# Patient Record
Sex: Female | Born: 1950
Health system: Southern US, Community
[De-identification: ages and names within clinical notes are randomized; demographics above are authoritative.]

## PROBLEM LIST (undated history)

## (undated) DIAGNOSIS — M810 Age-related osteoporosis without current pathological fracture: Secondary | ICD-10-CM

## (undated) DIAGNOSIS — R7303 Prediabetes: Secondary | ICD-10-CM

## (undated) DIAGNOSIS — H6990 Unspecified Eustachian tube disorder, unspecified ear: Secondary | ICD-10-CM

## (undated) DIAGNOSIS — H6980 Other specified disorders of Eustachian tube, unspecified ear: Secondary | ICD-10-CM

## (undated) DIAGNOSIS — K635 Polyp of colon: Secondary | ICD-10-CM

## (undated) DIAGNOSIS — M1990 Unspecified osteoarthritis, unspecified site: Secondary | ICD-10-CM

## (undated) DIAGNOSIS — G245 Blepharospasm: Secondary | ICD-10-CM

## (undated) DIAGNOSIS — N189 Chronic kidney disease, unspecified: Secondary | ICD-10-CM

## (undated) DIAGNOSIS — T7840XA Allergy, unspecified, initial encounter: Secondary | ICD-10-CM

## (undated) DIAGNOSIS — E785 Hyperlipidemia, unspecified: Secondary | ICD-10-CM

## (undated) DIAGNOSIS — Z87442 Personal history of urinary calculi: Secondary | ICD-10-CM

## (undated) DIAGNOSIS — I10 Essential (primary) hypertension: Secondary | ICD-10-CM

## (undated) DIAGNOSIS — R011 Cardiac murmur, unspecified: Secondary | ICD-10-CM

## (undated) DIAGNOSIS — D649 Anemia, unspecified: Secondary | ICD-10-CM

## (undated) DIAGNOSIS — E2839 Other primary ovarian failure: Secondary | ICD-10-CM

## (undated) HISTORY — DX: Unspecified eustachian tube disorder, unspecified ear: H69.90

## (undated) HISTORY — DX: Other primary ovarian failure: E28.39

## (undated) HISTORY — DX: Allergy, unspecified, initial encounter: T78.40XA

## (undated) HISTORY — PX: OVARY SURGERY: SHX727

## (undated) HISTORY — DX: Age-related osteoporosis without current pathological fracture: M81.0

## (undated) HISTORY — DX: Polyp of colon: K63.5

## (undated) HISTORY — DX: Chronic kidney disease, unspecified: N18.9

## (undated) HISTORY — DX: Other specified disorders of Eustachian tube, unspecified ear: H69.80

## (undated) HISTORY — PX: ABDOMINAL HYSTERECTOMY: SHX81

## (undated) HISTORY — DX: Unspecified osteoarthritis, unspecified site: M19.90

## (undated) HISTORY — PX: OTHER SURGICAL HISTORY: SHX169

## (undated) HISTORY — DX: Cardiac murmur, unspecified: R01.1

## (undated) HISTORY — PX: OOPHORECTOMY: SHX86

---

## 2010-03-20 HISTORY — PX: COLONOSCOPY: SHX174

## 2011-11-23 DIAGNOSIS — M25569 Pain in unspecified knee: Secondary | ICD-10-CM | POA: Insufficient documentation

## 2016-05-25 DIAGNOSIS — Z1231 Encounter for screening mammogram for malignant neoplasm of breast: Secondary | ICD-10-CM | POA: Diagnosis not present

## 2016-06-16 DIAGNOSIS — B349 Viral infection, unspecified: Secondary | ICD-10-CM | POA: Diagnosis not present

## 2016-06-16 DIAGNOSIS — J329 Chronic sinusitis, unspecified: Secondary | ICD-10-CM | POA: Diagnosis not present

## 2016-07-07 DIAGNOSIS — R030 Elevated blood-pressure reading, without diagnosis of hypertension: Secondary | ICD-10-CM | POA: Diagnosis not present

## 2016-07-07 DIAGNOSIS — M25551 Pain in right hip: Secondary | ICD-10-CM | POA: Diagnosis not present

## 2016-07-07 DIAGNOSIS — S76911A Strain of unspecified muscles, fascia and tendons at thigh level, right thigh, initial encounter: Secondary | ICD-10-CM | POA: Diagnosis not present

## 2016-07-07 DIAGNOSIS — S76211A Strain of adductor muscle, fascia and tendon of right thigh, initial encounter: Secondary | ICD-10-CM | POA: Diagnosis not present

## 2016-07-27 DIAGNOSIS — M1711 Unilateral primary osteoarthritis, right knee: Secondary | ICD-10-CM | POA: Diagnosis not present

## 2016-10-20 DIAGNOSIS — E559 Vitamin D deficiency, unspecified: Secondary | ICD-10-CM | POA: Diagnosis not present

## 2016-10-20 DIAGNOSIS — Z0000 Encounter for general adult medical examination without abnormal findings: Secondary | ICD-10-CM | POA: Diagnosis not present

## 2016-10-20 DIAGNOSIS — D649 Anemia, unspecified: Secondary | ICD-10-CM | POA: Diagnosis not present

## 2016-10-20 DIAGNOSIS — E039 Hypothyroidism, unspecified: Secondary | ICD-10-CM | POA: Diagnosis not present

## 2016-10-20 DIAGNOSIS — N3090 Cystitis, unspecified without hematuria: Secondary | ICD-10-CM | POA: Diagnosis not present

## 2016-10-20 DIAGNOSIS — N390 Urinary tract infection, site not specified: Secondary | ICD-10-CM | POA: Diagnosis not present

## 2016-10-20 DIAGNOSIS — E7800 Pure hypercholesterolemia, unspecified: Secondary | ICD-10-CM | POA: Diagnosis not present

## 2016-10-27 DIAGNOSIS — D649 Anemia, unspecified: Secondary | ICD-10-CM | POA: Diagnosis not present

## 2016-10-27 DIAGNOSIS — N3090 Cystitis, unspecified without hematuria: Secondary | ICD-10-CM | POA: Diagnosis not present

## 2016-10-27 DIAGNOSIS — E7800 Pure hypercholesterolemia, unspecified: Secondary | ICD-10-CM | POA: Diagnosis not present

## 2016-10-27 DIAGNOSIS — N390 Urinary tract infection, site not specified: Secondary | ICD-10-CM | POA: Diagnosis not present

## 2016-10-27 DIAGNOSIS — E039 Hypothyroidism, unspecified: Secondary | ICD-10-CM | POA: Diagnosis not present

## 2016-10-27 DIAGNOSIS — E559 Vitamin D deficiency, unspecified: Secondary | ICD-10-CM | POA: Diagnosis not present

## 2016-11-24 DIAGNOSIS — B369 Superficial mycosis, unspecified: Secondary | ICD-10-CM | POA: Diagnosis not present

## 2017-02-19 ENCOUNTER — Encounter (HOSPITAL_COMMUNITY): Payer: Self-pay | Admitting: *Deleted

## 2017-02-19 ENCOUNTER — Other Ambulatory Visit: Payer: Self-pay

## 2017-02-19 ENCOUNTER — Ambulatory Visit (HOSPITAL_COMMUNITY)
Admission: EM | Admit: 2017-02-19 | Discharge: 2017-02-19 | Disposition: A | Discharge status: Home / Self Care | Payer: Medicare Other | Attending: Urgent Care | Admitting: Urgent Care

## 2017-02-19 DIAGNOSIS — E785 Hyperlipidemia, unspecified: Secondary | ICD-10-CM | POA: Diagnosis not present

## 2017-02-19 DIAGNOSIS — N3090 Cystitis, unspecified without hematuria: Secondary | ICD-10-CM | POA: Diagnosis not present

## 2017-02-19 DIAGNOSIS — R350 Frequency of micturition: Secondary | ICD-10-CM

## 2017-02-19 DIAGNOSIS — Z885 Allergy status to narcotic agent status: Secondary | ICD-10-CM | POA: Insufficient documentation

## 2017-02-19 DIAGNOSIS — R300 Dysuria: Secondary | ICD-10-CM | POA: Diagnosis present

## 2017-02-19 DIAGNOSIS — N3091 Cystitis, unspecified with hematuria: Secondary | ICD-10-CM | POA: Diagnosis not present

## 2017-02-19 DIAGNOSIS — Z87442 Personal history of urinary calculi: Secondary | ICD-10-CM | POA: Insufficient documentation

## 2017-02-19 HISTORY — DX: Hyperlipidemia, unspecified: E78.5

## 2017-02-19 LAB — POCT URINALYSIS DIP (DEVICE)
BILIRUBIN URINE: NEGATIVE
GLUCOSE, UA: NEGATIVE mg/dL
KETONES UR: NEGATIVE mg/dL
Leukocytes, UA: NEGATIVE
NITRITE: NEGATIVE
PH: 7 (ref 5.0–8.0)
PROTEIN: NEGATIVE mg/dL
Specific Gravity, Urine: 1.01 (ref 1.005–1.030)
Urobilinogen, UA: 0.2 mg/dL (ref 0.0–1.0)

## 2017-02-19 MED ORDER — SULFAMETHOXAZOLE-TRIMETHOPRIM 800-160 MG PO TABS: 1.0000 | ORAL_TABLET | Freq: Two times a day (BID) | ORAL | 0 refills | Status: DC

## 2017-02-19 NOTE — Discharge Instructions (Signed)
I encourage you to call [facility] to establish care with a urologist in town given your medical history.

## 2017-02-19 NOTE — ED Triage Notes (Signed)
Pt came in stated she have uti, per pt she had blood in urine at home, per pt her back on the left side hurts,

## 2017-02-19 NOTE — ED Provider Notes (Signed)
  MRN: [id number] DOB: 05/05/1950  Subjective:   Michelle Foster is a 66 y.o. female presenting for 1 week history of left sided flank pain, dysuria, hematuria and urinary frequency. 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 Has been evaluated by urology and had normal work up, last [place] was 2015. Her last visit with gynecology was 2015, had normal findings. Denies fever, abdominal pain, pelvic pain, cloudy malordorous urine, genital rash and vaginal discharge, nausea and vomiting. Does not hydrate well, drinks tea, sodas and caffeine drinks. Has issues with sodas causing urinary irritation.  Michelle Foster takes Crestor and is allergic to codeine. Michelle Foster  has a past medical history of Hyperlipidemia. Also  has a past surgical history that includes Ovary surgery.  Objective:   Vitals: BP (!) 153/81 (BP Location: Left Arm) Comment: per pt this is her normal at the doctors office  Pulse 68   Temp 98.2 F (36.8 C) (Oral)   SpO2 100%   Physical Exam  Constitutional: She is oriented to person, place, and time. She appears well-developed and well-nourished.  HENT:  Mouth/Throat: Oropharynx is clear and moist.  Cardiovascular: Normal rate, regular rhythm and intact distal pulses. Exam reveals no gallop and no friction rub.  No murmur heard. Pulmonary/Chest: No respiratory distress. She has no wheezes. She has no rales.  Abdominal: Soft. Bowel sounds are normal. She exhibits no distension and no mass. There is tenderness (mild, right sided). There is no guarding.  No CVA tenderness.  Neurological: She is alert and oriented to person, place, and time.  Skin: Skin is warm and dry.    Results for orders placed or performed during the hospital encounter of 02/19/17 (from the past 24 hour(s))  POCT urinalysis dip (device)     Status: Abnormal   Collection Time: 02/19/17 10:40 AM  Result Value Ref Range   Glucose, UA NEGATIVE NEGATIVE mg/dL   Bilirubin  Urine NEGATIVE NEGATIVE   Ketones, ur NEGATIVE NEGATIVE mg/dL   Specific Gravity, Urine 1.010 1.005 - 1.030   Hgb urine dipstick SMALL (A) NEGATIVE   pH 7.0 5.0 - 8.0   Protein, ur NEGATIVE NEGATIVE mg/dL   Urobilinogen, UA 0.2 0.0 - 1.0 mg/dL   Nitrite NEGATIVE NEGATIVE   Leukocytes, UA NEGATIVE NEGATIVE   Assessment and Plan :   Cystitis  Urinary frequency  Patient is very worried about a UTI. However, I suspect she has interstitial cystitis and recommended she cut out caffeine drinks, sodas, sweet tea. Hydrate much better. She may also have a renal stone given her belly pain on exam. Counseled on differential. She will look to establish care with [facility] for a consult. In the meantime, will address infectious etiology with Bactrim x5 days. Return-to-clinic precautions discussed, patient verbalized understanding.   Jaynee Eagles, PA-C [facility] [phone] 02/19/2017 10:42 AM    Jaynee Eagles, PA-C 02/19/17 1114

## 2017-02-20 LAB — URINE CULTURE

## 2017-03-08 ENCOUNTER — Encounter: Payer: Self-pay | Admitting: Family Medicine

## 2017-03-08 ENCOUNTER — Ambulatory Visit (INDEPENDENT_AMBULATORY_CARE_PROVIDER_SITE_OTHER): Payer: Medicare Other | Admitting: Family Medicine

## 2017-03-08 VITALS — BP 152/98 | HR 80 | Temp 98.2°F | Resp 16 | Ht 64.0 in | Wt 150.0 lb

## 2017-03-08 DIAGNOSIS — Z7689 Persons encountering health services in other specified circumstances: Secondary | ICD-10-CM

## 2017-03-08 DIAGNOSIS — E7800 Pure hypercholesterolemia, unspecified: Secondary | ICD-10-CM | POA: Diagnosis not present

## 2017-03-08 DIAGNOSIS — Z23 Encounter for immunization: Secondary | ICD-10-CM | POA: Diagnosis not present

## 2017-03-08 DIAGNOSIS — M810 Age-related osteoporosis without current pathological fracture: Secondary | ICD-10-CM | POA: Diagnosis not present

## 2017-03-08 LAB — CBC WITH DIFFERENTIAL/PLATELET
BASOS PCT: 1.2 %
Basophils Absolute: 94 cells/uL (ref 0–200)
EOS ABS: 265 {cells}/uL (ref 15–500)
Eosinophils Relative: 3.4 %
HEMATOCRIT: 39.1 % (ref 35.0–45.0)
HEMOGLOBIN: 13.3 g/dL (ref 11.7–15.5)
LYMPHS ABS: 1989 {cells}/uL (ref 850–3900)
MCH: 31.8 pg (ref 27.0–33.0)
MCHC: 34 g/dL (ref 32.0–36.0)
MCV: 93.5 fL (ref 80.0–100.0)
MPV: 10.8 fL (ref 7.5–12.5)
Monocytes Relative: 6.1 %
Neutro Abs: 4976 cells/uL (ref 1500–7800)
Neutrophils Relative %: 63.8 %
Platelets: 317 10*3/uL (ref 140–400)
RBC: 4.18 10*6/uL (ref 3.80–5.10)
RDW: 12.2 % (ref 11.0–15.0)
Total Lymphocyte: 25.5 %
WBC: 7.8 10*3/uL (ref 3.8–10.8)
WBCMIX: 476 {cells}/uL (ref 200–950)

## 2017-03-08 LAB — COMPLETE METABOLIC PANEL WITH GFR
AG RATIO: 1.3 (calc) (ref 1.0–2.5)
ALBUMIN MSPROF: 4.2 g/dL (ref 3.6–5.1)
ALT: 14 U/L (ref 6–29)
AST: 18 U/L (ref 10–35)
Alkaline phosphatase (APISO): 95 U/L (ref 33–130)
BUN / CREAT RATIO: 18 (calc) (ref 6–22)
BUN: 18 mg/dL (ref 7–25)
CALCIUM: 9.9 mg/dL (ref 8.6–10.4)
CO2: 26 mmol/L (ref 20–32)
Chloride: 104 mmol/L (ref 98–110)
Creat: 1.01 mg/dL — ABNORMAL HIGH (ref 0.50–0.99)
GFR, EST AFRICAN AMERICAN: 67 mL/min/{1.73_m2} (ref >=60)
GFR, EST NON AFRICAN AMERICAN: 58 mL/min/{1.73_m2} — AB (ref >=60)
GLOBULIN: 3.3 g/dL (ref 1.9–3.7)
Glucose, Bld: 92 mg/dL (ref 65–99)
POTASSIUM: 4.5 mmol/L (ref 3.5–5.3)
SODIUM: 137 mmol/L (ref 135–146)
TOTAL PROTEIN: 7.5 g/dL (ref 6.1–8.1)
Total Bilirubin: 0.4 mg/dL (ref 0.2–1.2)

## 2017-03-08 LAB — LIPID PANEL
Cholesterol: 203 mg/dL — ABNORMAL HIGH (ref <200)
HDL: 38 mg/dL — ABNORMAL LOW (ref >50)
LDL Cholesterol (Calc): 133 mg/dL (calc) — ABNORMAL HIGH
NON-HDL CHOLESTEROL (CALC): 165 mg/dL — AB (ref <130)
TRIGLYCERIDES: 184 mg/dL — AB (ref <150)
Total CHOL/HDL Ratio: 5.3 (calc) — ABNORMAL HIGH (ref <5.0)

## 2017-03-08 NOTE — Addendum Note (Signed)
Addended by: Shary Decamp B on: 03/08/2017 12:37 PM   Modules accepted: Orders

## 2017-03-08 NOTE — Progress Notes (Signed)
Subjective:    Patient ID: Michelle Foster, female    DOB: 16-May-1950, 66 y.o.   MRN: [id number]  HPI Patient is a very pleasant 66 year old white female here today to establish care. Past medical history is significant for a bilateral oophorectomy at age 53 there was an accident and a surgical error causing premature menopause. She underwent a hysterectomy in 1983. She is also had a right meniscal repair as well as a biopsy of a benign mass on her urethra. Past medical history significant for hyperlipidemia and osteoporosis. Her last mammogram was in January. Her last colonoscopy was 4 years ago. She is due for colonoscopy next year due to a history of colon polyps. She does not require Pap smears because of her previous surgical history. She is due for her pneumonia vaccine, her shingles vaccine, her flu shot 66 y.o.   MRN: [id number]  HPI Patient is a very pleasant 66 year old white female here today to establish care. Past medical history is significant for a bilateral oophorectomy at age 53 there was an accident and a surgical error causing premature menopause. She underwent a hysterectomy in 1983. She is also had a right meniscal repair as well as a biopsy of a benign mass on her urethra. Past medical history significant for hyperlipidemia and osteoporosis. Her last mammogram was in January. Her last colonoscopy was 4 years ago. She is due for colonoscopy next year due to a history of colon polyps. She does not require Pap smears because of her previous surgical history. She is due for her pneumonia vaccine, her shingles vaccine, her flu shot as well as her tetanus shot. Past Medical History:  Diagnosis Date  . Hyperlipidemia   . Osteoporosis    Past Surgical History:  Procedure Laterality Date  . ABDOMINAL HYSTERECTOMY     TAH BSO by accident at 66   . meniscal tear     right  . OVARY SURGERY     1982   Current Outpatient Medications on File Prior to Visit  Medication Sig Dispense Refill  . aspirin EC 81 MG tablet Take 81 mg by mouth daily.    . Cholechiferol (VITAMIN D3) 5000 units CAPS Take by mouth.    Marland Kitchen ketoconazole (NIZORAL) 200 MG tablet 200 mg. Takes 2 tab the 1st of the month    . rosuvastatin (CRESTOR) 10 MG tablet Take 10 mg by mouth daily.     No current facility-administered medications on file prior to visit. Past Medical History:  Diagnosis Date  . Hyperlipidemia   . Osteoporosis    Past Surgical History:  Procedure Laterality Date  . ABDOMINAL HYSTERECTOMY     TAH BSO by accident at 66   . meniscal tear     right  . OVARY SURGERY     1982   Current Outpatient Medications on File Prior to Visit  Medication Sig Dispense Refill  . aspirin EC 81 MG tablet Take 81 mg by mouth daily.    . Cholecalciferol (VITAMIN D3) 5000 units CAPS Take by mouth.    Marland Kitchen ketoconazole (NIZORAL) 200 MG tablet 200 mg. Takes 2 tab the 1st of the month    . rosuvastatin (CRESTOR) 10 MG tablet Take 10 mg by mouth daily.     No current facility-administered medications on file prior to visit.    Allergies  Allergen Reactions  . Codeine    Social History   Socioeconomic History  . Marital status: Widowed    Spouse name: Not on file  . Number of children: Not on file  . Years of education: Not on file  . Highest education level: Not on file  Social Needs  . Financial resource strain: Not on file  . Food insecurity - worry: Not on file  . Food insecurity - inability:  Not on file  . Transportation needs - medical: Not on file  . Transportation needs - non-medical: Not on file  Occupational History  . Not on file  Tobacco Use  . Smoking status: Never Smoker  . Smokeless tobacco: Never Used  Substance and Sexual Activity  . Alcohol use: No    Frequency: Never  . Drug use: No  . Sexual activity: Not on file  Other Topics Concern  . Not on file  Social History Narrative  . Not on file   Family History  Problem Relation Age of Onset  . Cancer Mother 93      Review of Systems  All other systems reviewed and are negative.      Objective:   Physical Exam  Constitutional: She is oriented to person, place, and time. She appears well-developed and well-nourished. No distress.  HENT:  Head: Normocephalic and atraumatic.  Right Ear: External ear normal.  Left Ear: External ear normal.  Nose: Nose normal.  Mouth/Throat: Oropharynx is clear and moist. No oropharyngeal exudate.  Eyes: Conjunctivae and EOM are normal. Pupils are equal, round, and reactive to light. Right eye exhibits no discharge. Left eye exhibits no discharge. No scleral icterus.  Neck: Normal range of motion. Neck supple. No JVD present. No thyromegaly  present.  Cardiovascular: Normal rate, regular rhythm, normal heart sounds and intact distal pulses. Exam reveals no gallop and no friction rub.  No murmur heard. Pulmonary/Chest: Effort normal and breath sounds normal. No stridor. No respiratory distress. She has no wheezes. She has no rales. She exhibits no tenderness.  Abdominal: Soft. Bowel sounds are normal. She exhibits no distension and no mass. There is no tenderness. There is no rebound and no guarding.  Musculoskeletal: She exhibits no edema.  Lymphadenopathy:    She has no cervical adenopathy.  Neurological: She is alert and oriented to person, place, and time. She has normal reflexes. She displays normal reflexes. No cranial nerve deficit. She exhibits normal muscle  tone. Coordination normal.  Skin: She is not diaphoretic.  Psychiatric: She has a normal mood and affect. Her behavior is normal. Judgment and thought content normal.  Vitals reviewed.         Assessment & Plan:  Encounter to establish care with new doctor  Pure hypercholesterolemia - Plan: CBC with Differential/Platelet, COMPLETE METABOLIC PANEL WITH GFR, Lipid panel  Osteoporosis without current pathological fracture, unspecified osteoporosis type  Patient's blood pressure today is a little high. This is her first visit. I've asked her to check her blood pressure everyday and provide readings for [place] in 2 weeks. If persistently high, we will need to up titrate her medication to achieve a better blood pressure. I will also check a fasting lipid panel. Her goal LDL cholesterol is less than 130. Mammogram is due again in January. She is due for colonoscopy in 2019. She received Pneumovax 23 today in clinic as well as her flu shot. She has a history of osteoporosis. She states that she had a bone density test 2 years ago. She will bring the records and I will review this. She does have osteoporosis, I would recommend 1200 mg a day of calcium, 1000 units a day of vitamin D, and prolia every 6 months.

## 2017-03-22 ENCOUNTER — Other Ambulatory Visit: Payer: Self-pay | Admitting: Family Medicine

## 2017-03-22 MED ORDER — ROSUVASTATIN CALCIUM 10 MG PO TABS: 10.0000 mg | ORAL_TABLET | Freq: Every day | ORAL | 1 refills | Status: DC

## 2017-03-23 ENCOUNTER — Emergency Department (HOSPITAL_COMMUNITY)
Admission: EM | Admit: 2017-03-23 | Discharge: 2017-03-23 | Disposition: A | Discharge status: Home / Self Care | Payer: Medicare Other | Attending: Emergency Medicine | Admitting: Emergency Medicine

## 2017-03-23 ENCOUNTER — Ambulatory Visit (INDEPENDENT_AMBULATORY_CARE_PROVIDER_SITE_OTHER): Payer: Medicare Other | Admitting: Family Medicine

## 2017-03-23 ENCOUNTER — Other Ambulatory Visit: Payer: Self-pay

## 2017-03-23 ENCOUNTER — Encounter (HOSPITAL_COMMUNITY): Payer: Self-pay

## 2017-03-23 ENCOUNTER — Emergency Department (HOSPITAL_COMMUNITY): Payer: Medicare Other

## 2017-03-23 ENCOUNTER — Encounter: Payer: Self-pay | Admitting: Family Medicine

## 2017-03-23 VITALS — BP 150/96 | HR 88 | Temp 97.7°F

## 2017-03-23 DIAGNOSIS — Z79899 Other long term (current) drug therapy: Secondary | ICD-10-CM | POA: Insufficient documentation

## 2017-03-23 DIAGNOSIS — Z7982 Long term (current) use of aspirin: Secondary | ICD-10-CM | POA: Diagnosis not present

## 2017-03-23 DIAGNOSIS — R072 Precordial pain: Secondary | ICD-10-CM | POA: Insufficient documentation

## 2017-03-23 DIAGNOSIS — R079 Chest pain, unspecified: Secondary | ICD-10-CM

## 2017-03-23 DIAGNOSIS — I700 Atherosclerosis of aorta: Secondary | ICD-10-CM | POA: Diagnosis not present

## 2017-03-23 LAB — I-STAT TROPONIN, ED
Troponin i, poc: 0 ng/mL (ref 0.00–0.08)
Troponin i, poc: 0.01 ng/mL (ref 0.00–0.08)

## 2017-03-23 LAB — CBC
HEMATOCRIT: 37.2 % (ref 36.0–46.0)
HEMOGLOBIN: 12.1 g/dL (ref 12.0–15.0)
MCH: 31.8 pg (ref 26.0–34.0)
MCHC: 32.5 g/dL (ref 30.0–36.0)
MCV: 97.6 fL (ref 78.0–100.0)
Platelets: 294 10*3/uL (ref 150–400)
RBC: 3.81 MIL/uL — ABNORMAL LOW (ref 3.87–5.11)
RDW: 13.2 % (ref 11.5–15.5)
WBC: 10.8 10*3/uL — AB (ref 4.0–10.5)

## 2017-03-23 LAB — BASIC METABOLIC PANEL
ANION GAP: 10 (ref 5–15)
BUN: 17 mg/dL (ref 6–20)
CHLORIDE: 105 mmol/L (ref 101–111)
CO2: 23 mmol/L (ref 22–32)
Calcium: 9.1 mg/dL (ref 8.9–10.3)
Creatinine, Ser: 1.13 mg/dL — ABNORMAL HIGH (ref 0.44–1.00)
GFR calc Af Amer: 57 mL/min — ABNORMAL LOW (ref >60)
GFR, EST NON AFRICAN AMERICAN: 50 mL/min — AB (ref >60)
GLUCOSE: 85 mg/dL (ref 65–99)
POTASSIUM: 4.1 mmol/L (ref 3.5–5.1)
Sodium: 138 mmol/L (ref 135–145)

## 2017-03-23 MED ORDER — NITROGLYCERIN 0.4 MG SL SUBL: 0.4000 mg | SUBLINGUAL_TABLET | SUBLINGUAL | 0 refills | Status: DC | PRN

## 2017-03-23 MED ORDER — IOPAMIDOL (ISOVUE-370) INJECTION 76%
INTRAVENOUS | Status: AC
  Administered 2017-03-23: 100 mL
  Filled 2017-03-23: qty 100

## 2017-03-23 MED ORDER — NITROGLYCERIN 0.4 MG SL SUBL
0.4000 mg | SUBLINGUAL_TABLET | Freq: Once | SUBLINGUAL | Status: AC
  Administered 2017-03-23: 0.4 mg via SUBLINGUAL

## 2017-03-23 MED ORDER — ASPIRIN 81 MG PO CHEW
81.0000 mg | CHEWABLE_TABLET | Freq: Once | ORAL | Status: AC
  Administered 2017-03-23: 81 mg via ORAL

## 2017-03-23 MED ORDER — NITROGLYCERIN 0.4 MG SL SUBL
0.4000 mg | SUBLINGUAL_TABLET | SUBLINGUAL | Status: DC | PRN
Start: 2017-03-23 — End: 2017-03-24
  Administered 2017-03-23 (×2): 0.4 mg via SUBLINGUAL
  Filled 2017-03-23 (×2): qty 1

## 2017-03-23 NOTE — ED Notes (Signed)
Pt ambulated to restroom. Pt states she is "just a bit dizzy". Steady gait and tolerated well.

## 2017-03-23 NOTE — ED Provider Notes (Signed)
Patient presents for evaluation of back, and chest pain. Symptoms started early this morning. Worse when up and around "doing errands". Described as a dull ache rates from mid thoracic spine 2 anterior midline chest. Shortness of breath. See a primary care. Had normal EKG. Given aspirin and nitroglycerin. Nitroglycerin improved but did not resolve her symptoms. History of hypercholesterolemia. Father had an MI 1. Heart score of 4 including moderate concern on history.  History troponin normal. We'll try some additional nitroglycerin here. She had some numbness and discomfort in her left foot fourth and fifth digit. Normal pulses and no pulse deficits or frank weakness. Plan CT aorta. We'll plan for admission for rule out is negative.   Tanna Furry, MD 03/23/17 Drema [place]on

## 2017-03-23 NOTE — ED Notes (Signed)
Pt states pain is 3/10 after admin of two nitro tabs. Pt states that she doesn't need another tab at this time.

## 2017-03-23 NOTE — Addendum Note (Signed)
Addended by: Sheral Flow on: 03/23/2017 04:38 PM   Modules accepted: Orders

## 2017-03-23 NOTE — Progress Notes (Signed)
   Subjective:    Patient ID: Michelle Foster, female    DOB: 1951-03-06, 67 y.o.   MRN: [id number]  Patient presents for Chest Pain; Back Pain; and left jaw pain Pt here with chest pain that woke her up at 8:30 this morning, substernal radiating to her back, she had some shortness of breath and pain into her left jaw  She has history of  Hyperlipidemia, otherwise no cardiac or respiratory problems Family history of A fib Pain has been persistant since this morning  She has felt dizzy on occasion and short of breath  She took omeprazole but this did not help   She took ASA 162mg  1 hour before coming t oclinic   In  office, still having chest pain, not sharp but sensation still present, given 1 asa and Nitroglycerin 0.4mg SL.  EKG- showed NSR with few PAC    Review Of Systems:  GEN- denies fatigue, fever, weight loss,weakness, recent illness HEENT- denies eye drainage, change in vision, nasal discharge, CVS- denies chest pain, palpitations RESP- denies SOB, cough, wheeze ABD- denies N/V, change in stools, abd pain GU- denies dysuria, hematuria, dribbling, incontinence MSK- denies joint pain, muscle aches, injury Neuro- denies headache, dizziness, syncope, seizure activity       Objective:    BP (!) 150/96   Pulse 88   Temp 97.7 F (36.5 C) (Oral)   SpO2 98%  GEN- NAD, alert and oriented x3 HEENT- PERRL, EOMI, non injected sclera, pink conjunctiva, MMM, oropharynx clear Neck- Supple,  CVS- RRR, no murmur RESP-CTAB ABD-NABS,soft,NT,ND EXT- No edema Pulses- Radial, DP- 2+        Assessment & Plan:      Problem List Items Addressed This Visit    None    Visit Diagnoses    Chest pain, unspecified type    -  Primary   Concern for possible NSTEMI, with her aginal symptoms, risk factors elevated BP, Hyperlipidemia. She was given NTG, ASA, 1L oxygen,send ED for urgent evaluation Other differential REFLUX/GERD, she has also been under some stress lateley   Relevant  Orders   EKG 12-Lead (Completed)      Note: This dictation was prepared with Dragon dictation along with smaller phrase technology. Any transcriptional errors that result from this process are unintentional.

## 2017-03-23 NOTE — Patient Instructions (Signed)
Go directly to ER You need work up Nitroglycerin given at 3:16pm, Has had ASA 325mg   F/U pending results

## 2017-03-23 NOTE — ED Notes (Signed)
Patient transported to CT 

## 2017-03-23 NOTE — ED Provider Notes (Signed)
[facility] EMERGENCY DEPARTMENT Provider Note   CSN: [id number] Arrival date & time: 03/23/17  1618     History   Chief Complaint Chief Complaint  Patient presents with  . Chest Pain    HPI Michelle Foster is a 67 y.o. female.  The history is provided by the patient and medical records.  Chest Pain   This is a new problem. The current episode started 6 to 12 hours ago. The problem occurs constantly. The problem has been gradually improving. The pain is associated with rest. The pain is present in the substernal region. The pain is moderate. Quality: "dull" The pain radiates to the left jaw and upper back. The symptoms are aggravated by certain positions. Associated symptoms include back pain, malaise/fatigue and shortness of breath (for last several months). Pertinent negatives include no abdominal pain, no cough, no fever, no headaches, no lower extremity edema, no nausea, no palpitations and no vomiting. She has tried nitroglycerin for the symptoms. The treatment provided mild relief. Risk factors include being elderly.  Her past medical history is significant for hyperlipidemia.    Past Medical History:  Diagnosis Date  . Hyperlipidemia   . Osteoporosis     There are no active problems to display for this patient.   Past Surgical History:  Procedure Laterality Date  . ABDOMINAL HYSTERECTOMY     TAH BSO by accident at 3   . meniscal tear     right  . OVARY SURGERY     1982    OB History    No data available       Home Medications    Prior to Admission medications   Medication Sig Start Date End Date Taking? Authorizing Provider  aspirin EC 81 MG tablet Take 81 mg by mouth daily.   Yes [provider]  Cholecalciferol (VITAMIN D3) 5000 units CAPS Take by mouth.   Yes [provider]  ketoconazole (NIZORAL) 200 MG tablet 200 mg. Takes 2 tab the 1st of the month   Yes [provider]  rosuvastatin (CRESTOR) 10  MG tablet Take 1 tablet (10 mg total) by mouth daily. 03/22/17  Yes Susy Frizzle, MD  nitroGLYCERIN (NITROSTAT) 0.4 MG SL tablet Place 1 tablet (0.4 mg total) under the tongue every 5 (five) minutes as needed for chest pain (For a maximum of 3 pills; if you require more than 3 pills, call 911). 03/23/17   Jenny Reichmann, MD    Family History Family History  Problem Relation Age of Onset  . Cancer Mother 58    Social History Social History   Tobacco Use  . Smoking status: Never Smoker  . Smokeless tobacco: Never Used  Substance Use Topics  . Alcohol use: No    Frequency: Never  . Drug use: No     Allergies   Codeine   Review of Systems Review of Systems  Constitutional: Positive for malaise/fatigue. Negative for chills and fever.  HENT: Negative for rhinorrhea and sore throat.   Eyes: Negative for visual disturbance.  Respiratory: Positive for shortness of breath (for last several months). Negative for cough.   Cardiovascular: Positive for chest pain. Negative for palpitations.  Gastrointestinal: Negative for abdominal pain, nausea and vomiting.  Genitourinary: Negative for dysuria.  Musculoskeletal: Positive for back pain. Negative for arthralgias.  Skin: Negative for color change and rash.  Allergic/Immunologic: Negative for immunocompromised state.  Neurological: Negative for syncope and headaches.  Psychiatric/Behavioral: Negative for confusion.  All  other systems reviewed and are negative.    Physical Exam Updated Vital Signs BP (!) 152/85   Pulse 72   Temp 98.2 F (36.8 C) (Oral)   Resp 18   Ht 5\' 4"  (1.626 m)   Wt 68 kg (150 lb)   SpO2 100%   BMI 25.75 kg/m   Physical Exam  Constitutional: She is oriented to person, place, and time. She appears well-developed and well-nourished. No distress.  HENT:  Head: Normocephalic and atraumatic.  Eyes: Conjunctivae are normal.  Neck: Neck supple.  Cardiovascular: Normal rate and regular rhythm.  No murmur  heard. Pulmonary/Chest: Effort normal and breath sounds normal. No respiratory distress.  Abdominal: Soft. There is no tenderness.  Musculoskeletal: Normal range of motion. She exhibits no edema.  Neurological: She is alert and oriented to person, place, and time.  Skin: Skin is warm and dry.  Psychiatric: She has a normal mood and affect.  Nursing note and vitals reviewed.    ED Treatments / Results  Labs (all labs ordered are listed, but only abnormal results are displayed) Labs Reviewed  BASIC METABOLIC PANEL - Abnormal; Notable for the following components:      Result Value   Creatinine, Ser 1.13 (*)    GFR calc non Af Amer 50 (*)    GFR calc Af Amer 57 (*)    All other components within normal limits  CBC - Abnormal; Notable for the following components:   WBC 10.8 (*)    RBC 3.81 (*)    All other components within normal limits  I-STAT TROPONIN, ED  I-STAT TROPONIN, ED  I-STAT TROPONIN, ED    EKG  EKG Interpretation  Date/Time:  Friday March 23 2017 16:23:43 EST Ventricular Rate:  62 PR Interval:    QRS Duration: 111 QT Interval:  428 QTC Calculation: 435 R Axis:   40 Text Interpretation:  Sinus arrhythmia Confirmed by Tanna Furry [phone]) on 03/23/2017 4:26:45 PM Also confirmed by Tanna Furry [phone])  on 03/23/2017 5:13:27 PM       Radiology Dg Chest 2 View  Result Date: 03/23/2017 CLINICAL DATA:  Chest pain EXAM: CHEST  2 VIEW COMPARISON:  None. FINDINGS: Normal heart size and mediastinal contours. No acute infiltrate or edema. No effusion or pneumothorax. No acute osseous findings. EKG leads create artifact across the chest. IMPRESSION: Negative chest. Electronically Signed   By: Monte Fantasia M.D.   On: 03/23/2017 17:43   Ct Angio Chest Aorta W And/or Wo Contrast  Result Date: 03/23/2017 CLINICAL DATA:  Chest and back pain beginning this morning. EXAM: CT ANGIOGRAPHY CHEST WITH CONTRAST TECHNIQUE: Multidetector CT imaging of the chest was performed using  the standard protocol during bolus administration of intravenous contrast. Multiplanar CT image reconstructions and MIPs were obtained to evaluate the vascular anatomy. CONTRAST:  100 cc Isovue 370 intravenous COMPARISON:  Radiography earlier today FINDINGS: Cardiovascular: Satisfactory opacification of the pulmonary arteries to the segmental level. No evidence of pulmonary embolism. Normal heart size. No pericardial effusion. Atherosclerosis of the aorta and coronaries. Mediastinum/Nodes: Negative for mass or adenopathy Lungs/Pleura: There is no edema, consolidation, effusion, or pneumothorax. Borderline airway thickening. Expiratory phase with patchy atelectasis. Upper Abdomen: Negative Musculoskeletal: Negative Review of the MIP images confirms the above findings. IMPRESSION: 1. Negative for pulmonary embolism. 2.  Aortic Atherosclerosis (ICD10-I70.0).  Coronary atherosclerosis. Electronically Signed   By: Monte Fantasia M.D.   On: 03/23/2017 20:12    Procedures Procedures (including critical care time)  Medications Ordered in  ED Medications  nitroGLYCERIN (NITROSTAT) SL tablet 0.4 mg (0.4 mg Sublingual Given 03/23/17 2029)  iopamidol (ISOVUE-370) 76 % injection (100 mLs  Contrast Given 03/23/17 1929)     Initial Impression / Assessment and Plan / ED Course  I have reviewed the triage vital signs and the nursing notes.  Pertinent labs & imaging results that were available during my care of the patient were reviewed by me and considered in my medical decision making (see chart for details).     Pt with h/o HLD presents with CP. Says the pain started this morning, is localized to the substernal area w/radiation to upper back and left jaw, "dull" in character, moderate in severity, associated with some fatigue, made better by lying down, and exacerbated by activity; she also notes that she's had some DOE for the last several months. Says before this episode, she was in her normal state of health.  Seen at her PCP's office and sent here for further evaluation based on her symptoms.  VS & exam as above. EKG: NSR @ 62bpm w/normal intervals & no signs of ischemia. HEAR score 3. CXR WNL. Labs unremarkable, including two negative troponins. CTA negative for PE & dissection. Cause of the Pt's symptoms unclear, but doubt emergent etiology given history, exam, and workup. Recommending the Pt schedule a stress test as an outpatient for further evaluation, which the Pt agreed with & preferred over the option of admission for additional testing; will prescribe some NTG should the Pt's symptoms return.  Explained all results to the Pt. Will discharge the Pt home. Recommending follow-up with PCP & cardiology. ED return precautions provided. Pt acknowledged understanding of, and concurrence with the plan. All questions answered to her satisfaction. In stable condition at the time of discharge.  Final Clinical Impressions(s) / ED Diagnoses   Final diagnoses:  Precordial pain    ED Discharge Orders        Ordered    nitroGLYCERIN (NITROSTAT) 0.4 MG SL tablet  Every 5 min PRN     03/23/17 2232       Jenny Reichmann, MD 03/23/17 2237    Tanna Furry, MD 03/24/17 2311

## 2017-03-23 NOTE — ED Triage Notes (Signed)
Pt arrived via [facility] from [facility], after c/o CP that radiated to back; per EMS patient presented with some SOB that started at 830a. Pt states tshe took 2 prilosec due to thought of indigestion; Pt states that pain also radiated to L jaw. No c/o N/V. Pt rec'd ASA 324mg  and 1 nitro given at approx 1500. Pt states that she has been under a lot of stress which she feels may contribute

## 2017-03-30 ENCOUNTER — Encounter: Payer: Self-pay | Admitting: Cardiovascular Disease

## 2017-03-30 ENCOUNTER — Ambulatory Visit (INDEPENDENT_AMBULATORY_CARE_PROVIDER_SITE_OTHER): Payer: Medicare Other | Admitting: Cardiovascular Disease

## 2017-03-30 ENCOUNTER — Telehealth (HOSPITAL_COMMUNITY): Payer: Self-pay

## 2017-03-30 VITALS — BP 133/83 | HR 75 | Ht 64.0 in | Wt 148.8 lb

## 2017-03-30 DIAGNOSIS — R0609 Other forms of dyspnea: Secondary | ICD-10-CM

## 2017-03-30 DIAGNOSIS — E7800 Pure hypercholesterolemia, unspecified: Secondary | ICD-10-CM

## 2017-03-30 DIAGNOSIS — R0789 Other chest pain: Secondary | ICD-10-CM | POA: Diagnosis not present

## 2017-03-30 DIAGNOSIS — E785 Hyperlipidemia, unspecified: Secondary | ICD-10-CM | POA: Insufficient documentation

## 2017-03-30 NOTE — Progress Notes (Signed)
03/30/2017 [name]   Jun 07, 1950  [id number]  Primary Physician Pickard, Cammie Mcgee, MD Primary Cardiologist: Lorretta Harp MD Garret Reddish, [place], [place]  HPI:  Michelle Foster is a 67 y.o. mildly overweight engaged Caucasian female mother of one, grandmother of 5 grandchildren and is accompanied by Dawayne Patricia .Marland Kitchen They're scheduled to get married tomorrow. She was referred by the emergency room for cardiovascular evaluation because of recent episode of chest pain. Her only risk factors include treated hyperlipidemia and family history with father who had a myocardial infarction and a brother had bypass surgery in his early 89s expired. She apparently had a clean cardiac catheterization in [facility]. She is retired from being an Press photographer. She's also noticed increasing dyspnea on exertion over the last 3-4 months especially walking up stairs. She had onset of back pain radiating to her left jaw and chest last Friday and went to the emergency room. She received 4 sublingual glycerin which resulted in improvement in her symptoms. She ruled out for myocardial infarction. EKG showed no acute changes. She's had no recurrent symptoms.   Current Meds  Medication Sig  . aspirin EC 81 MG tablet Take 81 mg by mouth daily.  . Cholecalciferol (VITAMIN D3) 5000 units CAPS Take by mouth.  Marland Kitchen ketoconazole (NIZORAL) 200 MG tablet 200 mg. Takes 2 tab the 1st of the month  . nitroGLYCERIN (NITROSTAT) 0.4 MG SL tablet Place 1 tablet (0.4 mg total) under the tongue every 5 (five) minutes as needed for chest pain (For a maximum of 3 pills; if you require more than 3 pills, call 911).  . rosuvastatin (CRESTOR) 10 MG tablet Take 1 tablet (10 mg total) by mouth daily.     Allergies  Allergen Reactions  . Codeine Nausea And Vomiting    Social History   Socioeconomic History  . Marital status: Widowed    Spouse name: Not on file  . Number of children: Not on file  . Years of education: Not on  file  . Highest education level: Not on file  Social Needs  . Financial resource strain: Not on file  . Food insecurity - worry: Not on file  . Food insecurity - inability: Not on file  . Transportation needs - medical: Not on file  . Transportation needs - non-medical: Not on file  Occupational History  . Not on file  Tobacco Use  . Smoking status: Never Smoker  . Smokeless tobacco: Never Used  Substance and Sexual Activity  . Alcohol use: No    Frequency: Never  . Drug use: No  . Sexual activity: Not on file  Other Topics Concern  . Not on file  Social History Narrative  . Not on file     Review of Systems: General: negative for chills, fever, night sweats or weight changes.  Cardiovascular: negative for chest pain, dyspnea on exertion, edema, orthopnea, palpitations, paroxysmal nocturnal dyspnea or shortness of breath Dermatological: negative for rash Respiratory: negative for cough or wheezing Urologic: negative for hematuria Abdominal: negative for nausea, vomiting, diarrhea, bright red blood per rectum, melena, or hematemesis Neurologic: negative for visual changes, syncope, or dizziness All other systems reviewed and are otherwise negative except as noted above.    Blood pressure 133/83, pulse 75, height 5\' 4"  (1.626 m), weight 148 lb 12.8 oz (67.5 kg), SpO2 96 %.  General appearance: alert and no distress Neck: no adenopathy, no carotid bruit, no JVD, supple, symmetrical, trachea midline and thyroid  not enlarged, symmetric, no tenderness/mass/nodules Lungs: clear to auscultation bilaterally Heart: regular rate and rhythm, S1, S2 normal, no murmur, click, rub or gallop Extremities: extremities normal, atraumatic, no cyanosis or edema Pulses: 2+ and symmetric Skin: Skin color, texture, turgor normal. No rashes or lesions Neurologic: Alert and oriented X 3, normal strength and tone. Normal symmetric reflexes. Normal coordination and gait  EKG not performed  today  ASSESSMENT AND PLAN:   Hyperlipidemia History of hyperlipidemia on statin therapy followed by her PCP  Atypical chest pain Recent episode of atypical chest pain on Friday 03/23/17 with radiation from her back as well as left jaw. Her pain improved with sublingual nitroglycerin 4. Enzymes were negative and EKG showed no acute changes and she was discharged home. She's had no recurrent symptoms. Factors include hyperlipidemia and family history with brother does have bypass surgery in his early 60s and a sense expired. I'm going to get exercise Myoview stress test to further evaluate.  Dyspnea on exertion History of recently recognized dyspnea on exertion in the last 3-4 months. There is no smoking history. She may be deconditioned. I am going to get a 2-D echo to further evaluate.      Lorretta Harp MD FACP,FACC,FAHA, [facility] 03/30/2017 8:26 AM

## 2017-03-30 NOTE — Assessment & Plan Note (Signed)
History of recently recognized dyspnea on exertion in the last 3-4 months. There is no smoking history. She may be deconditioned. I am going to get a 2-D echo to further evaluate.

## 2017-03-30 NOTE — Telephone Encounter (Signed)
Encounter complete. 

## 2017-03-30 NOTE — Patient Instructions (Signed)
Medication Instructions: Your physician recommends that you continue on your current medications as directed. Please refer to the Current Medication list given to you today.  Testing/Procedures: Your physician has requested that you have an echocardiogram. Echocardiography is a painless test that uses sound waves to create images of your heart. It provides your doctor with information about the size and shape of your heart and how well your heart's chambers and valves are working. This procedure takes approximately one hour. There are no restrictions for this procedure.  Your physician has requested that you have an exercise stress myoview. For further information please visit [email or web address]. Please follow instruction sheet, as given.  Follow-Up: Your physician wants you to follow-up in: 6 months with Dr. Gwenlyn Found. You will receive a reminder letter in the mail two months in advance. If you don't receive a letter, please call our office to schedule the follow-up appointment.  If you need a refill on your cardiac medications before your next appointment, please call your pharmacy.

## 2017-03-30 NOTE — Assessment & Plan Note (Signed)
Recent episode of atypical chest pain on Friday 03/23/17 with radiation from her back as well as left jaw. Her pain improved with sublingual nitroglycerin 4. Enzymes were negative and EKG showed no acute changes and she was discharged home. She's had no recurrent symptoms. Factors include hyperlipidemia and family history with brother does have bypass surgery in his early 72s and a sense expired. I'm going to get exercise Myoview stress test to further evaluate.

## 2017-03-30 NOTE — Assessment & Plan Note (Signed)
History of hyperlipidemia on statin therapy followed by her PCP. 

## 2017-04-02 ENCOUNTER — Ambulatory Visit (HOSPITAL_BASED_OUTPATIENT_CLINIC_OR_DEPARTMENT_OTHER): Payer: Medicare Other

## 2017-04-02 ENCOUNTER — Other Ambulatory Visit: Payer: Self-pay

## 2017-04-02 DIAGNOSIS — R5383 Other fatigue: Secondary | ICD-10-CM | POA: Diagnosis not present

## 2017-04-02 DIAGNOSIS — R0609 Other forms of dyspnea: Secondary | ICD-10-CM

## 2017-04-02 DIAGNOSIS — R002 Palpitations: Secondary | ICD-10-CM | POA: Diagnosis not present

## 2017-04-02 DIAGNOSIS — R0789 Other chest pain: Secondary | ICD-10-CM | POA: Diagnosis not present

## 2017-04-02 DIAGNOSIS — E785 Hyperlipidemia, unspecified: Secondary | ICD-10-CM | POA: Diagnosis not present

## 2017-04-02 DIAGNOSIS — Z8249 Family history of ischemic heart disease and other diseases of the circulatory system: Secondary | ICD-10-CM | POA: Diagnosis not present

## 2017-04-03 ENCOUNTER — Ambulatory Visit (HOSPITAL_COMMUNITY)
Admission: RE | Admit: 2017-04-03 | Discharge: 2017-04-03 | Disposition: A | Discharge status: Home / Self Care | Payer: Medicare Other | Source: Ambulatory Visit | Attending: Cardiovascular Disease | Admitting: Cardiovascular Disease

## 2017-04-03 DIAGNOSIS — Z8249 Family history of ischemic heart disease and other diseases of the circulatory system: Secondary | ICD-10-CM | POA: Insufficient documentation

## 2017-04-03 DIAGNOSIS — R5383 Other fatigue: Secondary | ICD-10-CM | POA: Diagnosis not present

## 2017-04-03 DIAGNOSIS — E785 Hyperlipidemia, unspecified: Secondary | ICD-10-CM | POA: Diagnosis not present

## 2017-04-03 DIAGNOSIS — R0789 Other chest pain: Secondary | ICD-10-CM | POA: Insufficient documentation

## 2017-04-03 DIAGNOSIS — R002 Palpitations: Secondary | ICD-10-CM | POA: Insufficient documentation

## 2017-04-03 DIAGNOSIS — R0609 Other forms of dyspnea: Secondary | ICD-10-CM

## 2017-04-03 LAB — MYOCARDIAL PERFUSION IMAGING
CHL CUP MPHR: 154 {beats}/min
CHL CUP NUCLEAR SRS: 0
CHL CUP NUCLEAR SSS: 0
CHL RATE OF PERCEIVED EXERTION: 17
CSEPEW: 7.8 METS
CSEPHR: 101 %
Exercise duration (min): 6 min
Exercise duration (sec): 34 s
LV dias vol: 62 mL (ref 46–106)
LVSYSVOL: 17 mL
Peak HR: 157 {beats}/min
Rest HR: 75 {beats}/min
SDS: 0
TID: 0.88

## 2017-04-03 MED ORDER — TECHNETIUM TC 99M TETROFOSMIN IV KIT
32.0000 | PACK | Freq: Once | INTRAVENOUS | Status: AC | PRN
  Administered 2017-04-03: 32 via INTRAVENOUS
  Filled 2017-04-03: qty 32

## 2017-04-03 MED ORDER — TECHNETIUM TC 99M TETROFOSMIN IV KIT
10.8000 | PACK | Freq: Once | INTRAVENOUS | Status: AC | PRN
  Administered 2017-04-03: 10.8 via INTRAVENOUS
  Filled 2017-04-03: qty 11

## 2017-04-05 ENCOUNTER — Ambulatory Visit: Payer: PRIVATE HEALTH INSURANCE | Admitting: Adult Health

## 2017-04-10 DIAGNOSIS — M9903 Segmental and somatic dysfunction of lumbar region: Secondary | ICD-10-CM | POA: Diagnosis not present

## 2017-04-10 DIAGNOSIS — M9905 Segmental and somatic dysfunction of pelvic region: Secondary | ICD-10-CM | POA: Diagnosis not present

## 2017-04-10 DIAGNOSIS — M9904 Segmental and somatic dysfunction of sacral region: Secondary | ICD-10-CM | POA: Diagnosis not present

## 2017-04-10 DIAGNOSIS — M542 Cervicalgia: Secondary | ICD-10-CM | POA: Diagnosis not present

## 2017-04-10 DIAGNOSIS — M545 Low back pain: Secondary | ICD-10-CM | POA: Diagnosis not present

## 2017-04-10 DIAGNOSIS — M9902 Segmental and somatic dysfunction of thoracic region: Secondary | ICD-10-CM | POA: Diagnosis not present

## 2017-04-10 DIAGNOSIS — M9901 Segmental and somatic dysfunction of cervical region: Secondary | ICD-10-CM | POA: Diagnosis not present

## 2017-05-22 DIAGNOSIS — H1013 Acute atopic conjunctivitis, bilateral: Secondary | ICD-10-CM | POA: Diagnosis not present

## 2017-05-22 DIAGNOSIS — G245 Blepharospasm: Secondary | ICD-10-CM | POA: Diagnosis not present

## 2017-05-31 DIAGNOSIS — B373 Candidiasis of vulva and vagina: Secondary | ICD-10-CM | POA: Diagnosis not present

## 2017-05-31 DIAGNOSIS — N390 Urinary tract infection, site not specified: Secondary | ICD-10-CM | POA: Diagnosis not present

## 2017-06-08 ENCOUNTER — Other Ambulatory Visit: Payer: Self-pay | Admitting: Family Medicine

## 2017-06-08 DIAGNOSIS — Z1231 Encounter for screening mammogram for malignant neoplasm of breast: Secondary | ICD-10-CM

## 2017-06-12 DIAGNOSIS — G245 Blepharospasm: Secondary | ICD-10-CM | POA: Diagnosis not present

## 2017-06-19 DIAGNOSIS — G245 Blepharospasm: Secondary | ICD-10-CM | POA: Diagnosis not present

## 2017-07-05 ENCOUNTER — Ambulatory Visit
Admission: RE | Admit: 2017-07-05 | Discharge: 2017-07-05 | Disposition: A | Discharge status: Home / Self Care | Payer: Medicare Other | Source: Ambulatory Visit | Attending: Family Medicine | Admitting: Family Medicine

## 2017-07-05 DIAGNOSIS — Z1231 Encounter for screening mammogram for malignant neoplasm of breast: Secondary | ICD-10-CM | POA: Diagnosis not present

## 2017-08-15 ENCOUNTER — Telehealth: Payer: Self-pay | Admitting: Family Medicine

## 2017-08-15 DIAGNOSIS — M810 Age-related osteoporosis without current pathological fracture: Secondary | ICD-10-CM

## 2017-08-15 NOTE — Telephone Encounter (Signed)
Pt was placed on prolia website on 03/08/2018 per kim plummer to start 51mth inj's for osteoporosis, I could not find a bone density/ dexa scan in her chart any where (I also called pt and she states she has never done one of these scans before). Insurance companies are very funny about rules with the inj, and they must have a reference for them to approve the injs. Can we place an order so we can get her scheduled for scan and then we will proceed from there with injs if needed?

## 2017-08-16 ENCOUNTER — Other Ambulatory Visit: Payer: Self-pay | Admitting: Family Medicine

## 2017-08-16 DIAGNOSIS — E2839 Other primary ovarian failure: Secondary | ICD-10-CM

## 2017-08-16 NOTE — Telephone Encounter (Signed)
This is not what she told me at her ov: "She has a history of osteoporosis. She states that she had a bone density test 2 years ago. She will bring the records and I will review this. She does have osteoporosis, I would recommend 1200 mg a day of calcium, 1000 units a day of vitamin D, and prolia every 6 months."  Please schedule dexa with diagnosis of osteoporosis to clarify situation

## 2017-08-16 NOTE — Telephone Encounter (Signed)
Dexa scan ordered. 

## 2017-08-27 ENCOUNTER — Ambulatory Visit
Admission: RE | Admit: 2017-08-27 | Discharge: 2017-08-27 | Disposition: A | Discharge status: Home / Self Care | Payer: Medicare Other | Source: Ambulatory Visit | Attending: Family Medicine | Admitting: Family Medicine

## 2017-08-27 DIAGNOSIS — Z780 Asymptomatic menopausal state: Secondary | ICD-10-CM | POA: Diagnosis not present

## 2017-08-27 DIAGNOSIS — M810 Age-related osteoporosis without current pathological fracture: Secondary | ICD-10-CM | POA: Diagnosis not present

## 2017-08-27 DIAGNOSIS — E2839 Other primary ovarian failure: Secondary | ICD-10-CM

## 2017-09-03 ENCOUNTER — Ambulatory Visit (INDEPENDENT_AMBULATORY_CARE_PROVIDER_SITE_OTHER): Payer: Medicare Other | Admitting: Family Medicine

## 2017-09-03 ENCOUNTER — Encounter: Payer: Self-pay | Admitting: Family Medicine

## 2017-09-03 VITALS — BP 130/80 | HR 78 | Temp 98.0°F | Resp 16 | Wt 160.0 lb

## 2017-09-03 DIAGNOSIS — I872 Venous insufficiency (chronic) (peripheral): Secondary | ICD-10-CM

## 2017-09-03 DIAGNOSIS — L304 Erythema intertrigo: Secondary | ICD-10-CM | POA: Diagnosis not present

## 2017-09-03 MED ORDER — CLOTRIMAZOLE-BETAMETHASONE 1-0.05 % EX CREA: 1.0000 "application " | TOPICAL_CREAM | Freq: Two times a day (BID) | CUTANEOUS | 0 refills | Status: DC

## 2017-09-03 MED ORDER — TERBINAFINE HCL 250 MG PO TABS: 250.0000 mg | ORAL_TABLET | Freq: Every day | ORAL | 0 refills | Status: DC

## 2017-09-03 NOTE — Progress Notes (Signed)
Subjective:    Patient ID: Michelle Foster, female    DOB: [birth year], 67 y.o.   MRN: [id number]  HPI Patient had a normal stress test in January of this year with no evidence of suppressed EF.  Patient is here today with her husband.  He is concerned because her legs are swelling.  First thing in the morning, there is very little swelling however throughout the day particular if she is on her feet for a prolonged period of time, her ankles and her shins in her lower legs will swell significantly.  She denies any pain in her legs.  She has no varicose veins.  She has trace pretibial pitting edema here today.  The swelling is symmetric.  She denies any chest pain shortness of breath or dyspnea on exertion.  She also has a history of chronic yeast infections.  Upon further discussion, she has intertrigo.  She reports a red scaling rash in the crease between her vagina and her thigh.  She is tried creams in the past with very little success.  However taking oral antifungal medications usually clear the situation up.  She declines to allow me to examine it today but her husband verifies a red patchy rash in the crease between her leg and her groin.  States that it itch but she denies any pain or spread Past Medical History:  Diagnosis Date  . Hyperlipidemia   . Osteoporosis    Past Surgical History:  Procedure Laterality Date  . ABDOMINAL HYSTERECTOMY     TAH BSO by accident at 73   . meniscal tear     right  . OVARY SURGERY     1982   Current Outpatient Medications on File Prior to Visit  Medication Sig Dispense Refill  . aspirin EC 81 MG tablet Take 81 mg by mouth daily.    . Cholecalciferol (VITAMIN D3) 5000 units CAPS Take by mouth.    . nitroGLYCERIN (NITROSTAT) 0.4 MG SL tablet Place 1 tablet (0.4 mg total) under the tongue every 5 (five) minutes as needed for chest pain (For a maximum of 3 pills; if you require more than 3 pills, call 911). 30 tablet 0  . rosuvastatin (CRESTOR) 10  MG tablet Take 1 tablet (10 mg total) by mouth daily. 90 tablet 1  . ketoconazole (NIZORAL) 200 MG tablet 200 mg. Takes 2 tab the 1st of the month     No current facility-administered medications on file prior to visit.    Allergies  Allergen Reactions  . Codeine Nausea And Vomiting   Social History   Socioeconomic History  . Marital status: Married    Spouse name: Not on file  . Number of children: Not on file  . Years of education: Not on file  . Highest education level: Not on file  Occupational History  . Not on file  Social Needs  . Financial resource strain: Not on file  . Food insecurity:    Worry: Not on file    Inability: Not on file  . Transportation needs:    Medical: Not on file    Non-medical: Not on file  Tobacco Use  . Smoking status: Never Smoker  . Smokeless tobacco: Never Used  Substance and Sexual Activity  . Alcohol use: No    Frequency: Never  . Drug use: No  . Sexual activity: Not on file  Lifestyle  . Physical activity:    Days per week: Not on file  Minutes per session: Not on file  . Stress: Not on file  Relationships  . Social connections:    Talks on phone: Not on file    Gets together: Not on file    Attends religious service: Not on file    Active member of club or organization: Not on file    Attends meetings of clubs or organizations: Not on file    Relationship status: Not on file  . Intimate partner violence:    Fear of current or ex partner: Not on file    Emotionally abused: Not on file    Physically abused: Not on file    Forced sexual activity: Not on file  Other Topics Concern  . Not on file  Social History Narrative  . Not on file   Family History  Problem Relation Age of Onset  . Cancer Mother 43  . Heart failure Father   . Heart failure Brother   . Cancer Brother       Review of Systems  All other systems reviewed and are negative.      Objective:   Physical Exam  Constitutional: She appears  well-developed and well-nourished. No distress.  Neck: Normal range of motion. Neck supple.  Cardiovascular: Normal rate, regular rhythm, normal heart sounds and intact distal pulses. Exam reveals no gallop and no friction rub.  No murmur heard. Pulmonary/Chest: Effort normal and breath sounds normal. No respiratory distress. She has no wheezes. She has no rales. She exhibits no tenderness.  Abdominal: Soft. Bowel sounds are normal. She exhibits no distension and no mass. There is no tenderness. There is no rebound and no guarding.  Musculoskeletal: She exhibits edema.  Skin: She is not diaphoretic.  Psychiatric: She has a normal mood and affect.  Vitals reviewed.         Assessment & Plan:  Intertrigo  Chronic venous insufficiency  I believe the patient has chronic venous insufficiency.  I recommended compression hose.  Patient declines compression hose.  I spent 15 minutes with the patient and her husband explaining the natural history of this.  Patient is comfortable with the explanation and declines any treatment at the present time.  I recommended trying Lotrisone cream twice daily for 7 to 14 days however the patient would like to take a pill version because she has had this several times in the past and this is always worked for her.  Therefore I will give her Lamisil 250 mg p.o. daily for 7 to 14 days.  I encouraged her to stop the pills as soon as the rash improves and to use the topical creams in the future before the rash gets out of hand to try to treat this.  If the rash does not improve, I will have to see the rash next and the patient agrees.

## 2017-09-26 ENCOUNTER — Other Ambulatory Visit: Payer: No Typology Code available for payment source

## 2017-09-27 DIAGNOSIS — M545 Low back pain: Secondary | ICD-10-CM | POA: Diagnosis not present

## 2017-09-27 DIAGNOSIS — M1711 Unilateral primary osteoarthritis, right knee: Secondary | ICD-10-CM | POA: Diagnosis not present

## 2017-10-02 ENCOUNTER — Other Ambulatory Visit: Payer: No Typology Code available for payment source

## 2017-10-15 DIAGNOSIS — M9903 Segmental and somatic dysfunction of lumbar region: Secondary | ICD-10-CM | POA: Diagnosis not present

## 2017-10-15 DIAGNOSIS — M5431 Sciatica, right side: Secondary | ICD-10-CM | POA: Diagnosis not present

## 2017-10-15 DIAGNOSIS — M545 Low back pain: Secondary | ICD-10-CM | POA: Diagnosis not present

## 2017-10-17 DIAGNOSIS — M545 Low back pain: Secondary | ICD-10-CM | POA: Diagnosis not present

## 2017-10-17 DIAGNOSIS — M5431 Sciatica, right side: Secondary | ICD-10-CM | POA: Diagnosis not present

## 2017-10-17 DIAGNOSIS — M9903 Segmental and somatic dysfunction of lumbar region: Secondary | ICD-10-CM | POA: Diagnosis not present

## 2017-10-22 DIAGNOSIS — M9903 Segmental and somatic dysfunction of lumbar region: Secondary | ICD-10-CM | POA: Diagnosis not present

## 2017-10-22 DIAGNOSIS — M545 Low back pain: Secondary | ICD-10-CM | POA: Diagnosis not present

## 2017-10-22 DIAGNOSIS — M5431 Sciatica, right side: Secondary | ICD-10-CM | POA: Diagnosis not present

## 2017-10-25 DIAGNOSIS — M5431 Sciatica, right side: Secondary | ICD-10-CM | POA: Diagnosis not present

## 2017-10-25 DIAGNOSIS — M9903 Segmental and somatic dysfunction of lumbar region: Secondary | ICD-10-CM | POA: Diagnosis not present

## 2017-10-25 DIAGNOSIS — M545 Low back pain: Secondary | ICD-10-CM | POA: Diagnosis not present

## 2017-11-05 ENCOUNTER — Ambulatory Visit (INDEPENDENT_AMBULATORY_CARE_PROVIDER_SITE_OTHER): Payer: Medicare Other

## 2017-11-05 DIAGNOSIS — M810 Age-related osteoporosis without current pathological fracture: Secondary | ICD-10-CM

## 2017-11-05 MED ORDER — DENOSUMAB 60 MG/ML ~~LOC~~ SOSY
60.0000 mg | PREFILLED_SYRINGE | SUBCUTANEOUS | Status: DC
  Administered 2017-11-05 – 2021-01-03 (×7): 60 mg via SUBCUTANEOUS

## 2017-11-05 NOTE — Progress Notes (Signed)
Patient came in today for her first injection of Prolia. Patient received Prolia in the left arm subq. Patient tolerated well.

## 2017-12-03 ENCOUNTER — Other Ambulatory Visit: Payer: Self-pay | Admitting: Family Medicine

## 2018-01-10 DIAGNOSIS — S46011A Strain of muscle(s) and tendon(s) of the rotator cuff of right shoulder, initial encounter: Secondary | ICD-10-CM | POA: Diagnosis not present

## 2018-01-10 DIAGNOSIS — M542 Cervicalgia: Secondary | ICD-10-CM | POA: Diagnosis not present

## 2018-01-11 DIAGNOSIS — M9903 Segmental and somatic dysfunction of lumbar region: Secondary | ICD-10-CM | POA: Diagnosis not present

## 2018-01-11 DIAGNOSIS — M545 Low back pain: Secondary | ICD-10-CM | POA: Diagnosis not present

## 2018-01-11 DIAGNOSIS — M9901 Segmental and somatic dysfunction of cervical region: Secondary | ICD-10-CM | POA: Diagnosis not present

## 2018-01-11 DIAGNOSIS — M9902 Segmental and somatic dysfunction of thoracic region: Secondary | ICD-10-CM | POA: Diagnosis not present

## 2018-01-14 DIAGNOSIS — M9903 Segmental and somatic dysfunction of lumbar region: Secondary | ICD-10-CM | POA: Diagnosis not present

## 2018-01-14 DIAGNOSIS — M545 Low back pain: Secondary | ICD-10-CM | POA: Diagnosis not present

## 2018-01-14 DIAGNOSIS — M9901 Segmental and somatic dysfunction of cervical region: Secondary | ICD-10-CM | POA: Diagnosis not present

## 2018-01-14 DIAGNOSIS — M9902 Segmental and somatic dysfunction of thoracic region: Secondary | ICD-10-CM | POA: Diagnosis not present

## 2018-01-16 DIAGNOSIS — M9902 Segmental and somatic dysfunction of thoracic region: Secondary | ICD-10-CM | POA: Diagnosis not present

## 2018-01-16 DIAGNOSIS — M545 Low back pain: Secondary | ICD-10-CM | POA: Diagnosis not present

## 2018-01-16 DIAGNOSIS — M9901 Segmental and somatic dysfunction of cervical region: Secondary | ICD-10-CM | POA: Diagnosis not present

## 2018-01-16 DIAGNOSIS — M9903 Segmental and somatic dysfunction of lumbar region: Secondary | ICD-10-CM | POA: Diagnosis not present

## 2018-01-17 DIAGNOSIS — M9902 Segmental and somatic dysfunction of thoracic region: Secondary | ICD-10-CM | POA: Diagnosis not present

## 2018-01-17 DIAGNOSIS — M9903 Segmental and somatic dysfunction of lumbar region: Secondary | ICD-10-CM | POA: Diagnosis not present

## 2018-01-17 DIAGNOSIS — M545 Low back pain: Secondary | ICD-10-CM | POA: Diagnosis not present

## 2018-01-17 DIAGNOSIS — M9901 Segmental and somatic dysfunction of cervical region: Secondary | ICD-10-CM | POA: Diagnosis not present

## 2018-01-18 DIAGNOSIS — G245 Blepharospasm: Secondary | ICD-10-CM | POA: Diagnosis not present

## 2018-01-18 DIAGNOSIS — M25571 Pain in right ankle and joints of right foot: Secondary | ICD-10-CM | POA: Diagnosis not present

## 2018-01-18 DIAGNOSIS — M545 Low back pain: Secondary | ICD-10-CM | POA: Diagnosis not present

## 2018-01-23 DIAGNOSIS — M545 Low back pain: Secondary | ICD-10-CM | POA: Diagnosis not present

## 2018-01-23 DIAGNOSIS — M9901 Segmental and somatic dysfunction of cervical region: Secondary | ICD-10-CM | POA: Diagnosis not present

## 2018-01-23 DIAGNOSIS — M9902 Segmental and somatic dysfunction of thoracic region: Secondary | ICD-10-CM | POA: Diagnosis not present

## 2018-01-23 DIAGNOSIS — M9903 Segmental and somatic dysfunction of lumbar region: Secondary | ICD-10-CM | POA: Diagnosis not present

## 2018-01-25 ENCOUNTER — Ambulatory Visit (INDEPENDENT_AMBULATORY_CARE_PROVIDER_SITE_OTHER): Payer: Medicare Other

## 2018-01-25 DIAGNOSIS — M25571 Pain in right ankle and joints of right foot: Secondary | ICD-10-CM | POA: Diagnosis not present

## 2018-01-25 DIAGNOSIS — Z23 Encounter for immunization: Secondary | ICD-10-CM

## 2018-01-25 DIAGNOSIS — M545 Low back pain: Secondary | ICD-10-CM | POA: Diagnosis not present

## 2018-01-25 NOTE — Progress Notes (Signed)
Patient was in office for high dose flu vaccine.Patient received vaccine in her right deltoid. Patient tolerated well

## 2018-01-28 DIAGNOSIS — M9903 Segmental and somatic dysfunction of lumbar region: Secondary | ICD-10-CM | POA: Diagnosis not present

## 2018-01-28 DIAGNOSIS — M9901 Segmental and somatic dysfunction of cervical region: Secondary | ICD-10-CM | POA: Diagnosis not present

## 2018-01-28 DIAGNOSIS — M545 Low back pain: Secondary | ICD-10-CM | POA: Diagnosis not present

## 2018-01-28 DIAGNOSIS — M9902 Segmental and somatic dysfunction of thoracic region: Secondary | ICD-10-CM | POA: Diagnosis not present

## 2018-01-30 DIAGNOSIS — M9902 Segmental and somatic dysfunction of thoracic region: Secondary | ICD-10-CM | POA: Diagnosis not present

## 2018-01-30 DIAGNOSIS — M9903 Segmental and somatic dysfunction of lumbar region: Secondary | ICD-10-CM | POA: Diagnosis not present

## 2018-01-30 DIAGNOSIS — M545 Low back pain: Secondary | ICD-10-CM | POA: Diagnosis not present

## 2018-01-30 DIAGNOSIS — M9901 Segmental and somatic dysfunction of cervical region: Secondary | ICD-10-CM | POA: Diagnosis not present

## 2018-01-31 DIAGNOSIS — M545 Low back pain: Secondary | ICD-10-CM | POA: Diagnosis not present

## 2018-02-04 DIAGNOSIS — M9901 Segmental and somatic dysfunction of cervical region: Secondary | ICD-10-CM | POA: Diagnosis not present

## 2018-02-04 DIAGNOSIS — M9903 Segmental and somatic dysfunction of lumbar region: Secondary | ICD-10-CM | POA: Diagnosis not present

## 2018-02-04 DIAGNOSIS — M545 Low back pain: Secondary | ICD-10-CM | POA: Diagnosis not present

## 2018-02-04 DIAGNOSIS — M9902 Segmental and somatic dysfunction of thoracic region: Secondary | ICD-10-CM | POA: Diagnosis not present

## 2018-02-19 DIAGNOSIS — H2513 Age-related nuclear cataract, bilateral: Secondary | ICD-10-CM | POA: Diagnosis not present

## 2018-02-19 DIAGNOSIS — G245 Blepharospasm: Secondary | ICD-10-CM | POA: Diagnosis not present

## 2018-02-20 DIAGNOSIS — M5416 Radiculopathy, lumbar region: Secondary | ICD-10-CM | POA: Diagnosis not present

## 2018-02-20 DIAGNOSIS — M545 Low back pain: Secondary | ICD-10-CM | POA: Diagnosis not present

## 2018-02-25 DIAGNOSIS — M5416 Radiculopathy, lumbar region: Secondary | ICD-10-CM | POA: Diagnosis not present

## 2018-02-25 DIAGNOSIS — M545 Low back pain: Secondary | ICD-10-CM | POA: Diagnosis not present

## 2018-03-11 DIAGNOSIS — M5416 Radiculopathy, lumbar region: Secondary | ICD-10-CM | POA: Diagnosis not present

## 2018-03-11 DIAGNOSIS — M545 Low back pain: Secondary | ICD-10-CM | POA: Diagnosis not present

## 2018-03-29 DIAGNOSIS — M5416 Radiculopathy, lumbar region: Secondary | ICD-10-CM | POA: Diagnosis not present

## 2018-04-22 DIAGNOSIS — G245 Blepharospasm: Secondary | ICD-10-CM | POA: Diagnosis not present

## 2018-04-22 DIAGNOSIS — M545 Low back pain: Secondary | ICD-10-CM | POA: Diagnosis not present

## 2018-04-22 DIAGNOSIS — M5416 Radiculopathy, lumbar region: Secondary | ICD-10-CM | POA: Diagnosis not present

## 2018-04-30 DIAGNOSIS — M545 Low back pain: Secondary | ICD-10-CM | POA: Diagnosis not present

## 2018-04-30 DIAGNOSIS — M5416 Radiculopathy, lumbar region: Secondary | ICD-10-CM | POA: Diagnosis not present

## 2018-05-06 DIAGNOSIS — M5416 Radiculopathy, lumbar region: Secondary | ICD-10-CM | POA: Diagnosis not present

## 2018-05-06 DIAGNOSIS — M545 Low back pain: Secondary | ICD-10-CM | POA: Diagnosis not present

## 2018-05-08 DIAGNOSIS — M5416 Radiculopathy, lumbar region: Secondary | ICD-10-CM | POA: Diagnosis not present

## 2018-05-08 DIAGNOSIS — M545 Low back pain: Secondary | ICD-10-CM | POA: Diagnosis not present

## 2018-05-10 ENCOUNTER — Ambulatory Visit: Payer: Medicare Other

## 2018-05-20 DIAGNOSIS — M545 Low back pain: Secondary | ICD-10-CM | POA: Diagnosis not present

## 2018-05-20 DIAGNOSIS — M5416 Radiculopathy, lumbar region: Secondary | ICD-10-CM | POA: Diagnosis not present

## 2018-05-21 ENCOUNTER — Encounter: Payer: Self-pay | Admitting: Family Medicine

## 2018-05-21 ENCOUNTER — Ambulatory Visit (INDEPENDENT_AMBULATORY_CARE_PROVIDER_SITE_OTHER): Payer: Medicare Other | Admitting: Family Medicine

## 2018-05-21 VITALS — BP 144/80 | HR 80 | Temp 98.2°F | Resp 16 | Ht 64.0 in | Wt 158.0 lb

## 2018-05-21 DIAGNOSIS — K591 Functional diarrhea: Secondary | ICD-10-CM | POA: Diagnosis not present

## 2018-05-21 DIAGNOSIS — M810 Age-related osteoporosis without current pathological fracture: Secondary | ICD-10-CM | POA: Diagnosis not present

## 2018-05-21 DIAGNOSIS — H6981 Other specified disorders of Eustachian tube, right ear: Secondary | ICD-10-CM

## 2018-05-21 DIAGNOSIS — Z1211 Encounter for screening for malignant neoplasm of colon: Secondary | ICD-10-CM | POA: Diagnosis not present

## 2018-05-21 MED ORDER — LEVOCETIRIZINE DIHYDROCHLORIDE 5 MG PO TABS: 5.0000 mg | ORAL_TABLET | Freq: Every evening | ORAL | 0 refills | Status: DC

## 2018-05-21 MED ORDER — DICLOFENAC SODIUM 75 MG PO TBEC: 75.0000 mg | DELAYED_RELEASE_TABLET | Freq: Two times a day (BID) | ORAL | 0 refills | Status: DC

## 2018-05-21 MED ORDER — FLUTICASONE PROPIONATE 50 MCG/ACT NA SUSP: 2.0000 | Freq: Every day | NASAL | 6 refills | Status: DC

## 2018-05-21 NOTE — Progress Notes (Signed)
Subjective:    Patient ID: Michelle Foster, female    DOB: [birth year], 68 y.o.   MRN: [id number]  HPI Patient presents today with several concerns.  She is due for her Prolia shot.  Second she recently flew to [place] with a head cold.  Since she has returned, she reports pain and pressure in her right ear.  She also reports tender lymphadenopathy in the right anterior cervical chain.  On examination there is no discernible lymphadenopathy although she is slightly tender to palpation over the anterior border of the sternocleidomastoid muscle.  She reports constant pain and aching sensation in her right ear.  It hurts to lay on that side.  She denies any pain with chewing.  On examination, there is a clear middle ear effusion.  There is no erythema.  There is no evidence of infection.  Patient appears to have eustachian tube dysfunction.  Third, she request a referral to gastroenterology for colonoscopy.  Her last colonoscopy was performed in [place] 6 years ago and was significant for a polyp.  They have recommended repeat colonoscopy in 5 years and she is overdue.  Fourth that she is would like to be screened for celiac disease.  Her daughter has celiac disease and she is heard that this is inherited.  She reports functional diarrhea for years.  Certain foods particular bread seem to irritate her stomach and cause indigestion as well as diarrhea Past Surgical History:  Procedure Laterality Date  . ABDOMINAL HYSTERECTOMY     TAH BSO by accident at 73   . meniscal tear     right  . OVARY SURGERY     1982   Current Outpatient Medications on File Prior to Visit  Medication Sig Dispense Refill  . aspirin EC 81 MG tablet Take 81 mg by mouth daily.    . Cholecalciferol (VITAMIN D3) 5000 units CAPS Take by mouth.    . clotrimazole-betamethasone (LOTRISONE) cream Apply 1 application topically 2 (two) times daily. 30 g 0  . ketoconazole (NIZORAL) 200 MG tablet 200 mg. Takes 2 tab the 1st of the month     . nitroGLYCERIN (NITROSTAT) 0.4 MG SL tablet Place 1 tablet (0.4 mg total) under the tongue every 5 (five) minutes as needed for chest pain (For a maximum of 3 pills; if you require more than 3 pills, call 911). 30 tablet 0  . rosuvastatin (CRESTOR) 10 MG tablet TAKE 1 TABLET BY MOUTH EVERY DAY 90 tablet 1  . terbinafine (LAMISIL) 250 MG tablet Take 1 tablet (250 mg total) by mouth daily. 14 tablet 0   Current Facility-Administered Medications on File Prior to Visit  Medication Dose Route Frequency Provider Last Rate Last Dose  . denosumab (PROLIA) injection 60 mg  60 mg Subcutaneous Q6 months Susy Frizzle, MD   60 mg at 11/05/17 1119   Allergies  Allergen Reactions  . Codeine Nausea And Vomiting   Social History   Socioeconomic History  . Marital status: Married    Spouse name: Not on file  . Number of children: Not on file  . Years of education: Not on file  . Highest education level: Not on file  Occupational History  . Not on file  Social Needs  . Financial resource strain: Not on file  . Food insecurity:    Worry: Not on file    Inability: Not on file  . Transportation needs:    Medical: Not on file    Non-medical: Not on  file  Tobacco Use  . Smoking status: Never Smoker  . Smokeless tobacco: Never Used  Substance and Sexual Activity  . Alcohol use: No    Frequency: Never  . Drug use: No  . Sexual activity: Not on file  Lifestyle  . Physical activity:    Days per week: Not on file    Minutes per session: Not on file  . Stress: Not on file  Relationships  . Social connections:    Talks on phone: Not on file    Gets together: Not on file    Attends religious service: Not on file    Active member of club or organization: Not on file    Attends meetings of clubs or organizations: Not on file    Relationship status: Not on file  . Intimate partner violence:    Fear of current or ex partner: Not on file    Emotionally abused: Not on file    Physically  abused: Not on file    Forced sexual activity: Not on file  Other Topics Concern  . Not on file  Social History Narrative  . Not on file   Family History  Problem Relation Age of Onset  . Cancer Mother 104  . Heart failure Father   . Heart failure Brother   . Cancer Brother       Review of Systems  All other systems reviewed and are negative.      Objective:   Physical Exam  Constitutional: She appears well-developed and well-nourished. No distress.  HENT:  Right Ear: Ear canal normal. Tympanic membrane is not injected, not scarred, not perforated, not erythematous, not retracted and not bulging. A middle ear effusion is present.  Left Ear: Tympanic membrane and ear canal normal. Tympanic membrane is not injected, not scarred, not perforated, not erythematous, not retracted and not bulging.  Neck: Normal range of motion. Neck supple.  Cardiovascular: Normal rate, regular rhythm, normal heart sounds and intact distal pulses. Exam reveals no gallop and no friction rub.  No murmur heard. Pulmonary/Chest: Effort normal and breath sounds normal. No respiratory distress. She has no wheezes. She has no rales. She exhibits no tenderness.  Abdominal: Soft. Bowel sounds are normal. She exhibits no distension and no mass. There is no abdominal tenderness. There is no rebound and no guarding.  Musculoskeletal:        General: Edema present.  Skin: She is not diaphoretic.  Psychiatric: She has a normal mood and affect.  Vitals reviewed.         Assessment & Plan:  Functional diarrhea - Plan: Celiac Disease Panel  Colon cancer screening - Plan: Ambulatory referral to Gastroenterology  Eustachian tube dysfunction, right  We will treat the eustachian tube dysfunction with Xyzal 5 mg a day, diclofenac 75 mg twice daily for inflammation, and Flonase 2 sprays each nostril daily.  Reassess in 2 weeks if no better or sooner if worse.  I will schedule the patient to meet with a  gastroenterologist for a colonoscopy.  I will gladly screen the patient for celiac disease by obtaining a celiac panel.  Patient received her Prolia injection today

## 2018-05-22 DIAGNOSIS — M545 Low back pain: Secondary | ICD-10-CM | POA: Diagnosis not present

## 2018-05-22 DIAGNOSIS — M5416 Radiculopathy, lumbar region: Secondary | ICD-10-CM | POA: Diagnosis not present

## 2018-05-23 LAB — CELIAC DISEASE PANEL
(TTG) AB, IGG: 2 U/mL
(tTG) Ab, IgA: 1 U/mL
GLIADIN IGG: 2 U
Gliadin IgA: 11 Units
Immunoglobulin A: 248 mg/dL (ref 70–320)

## 2018-05-29 DIAGNOSIS — M545 Low back pain: Secondary | ICD-10-CM | POA: Diagnosis not present

## 2018-05-29 DIAGNOSIS — M5416 Radiculopathy, lumbar region: Secondary | ICD-10-CM | POA: Diagnosis not present

## 2018-06-03 ENCOUNTER — Other Ambulatory Visit: Payer: Self-pay | Admitting: Family Medicine

## 2018-06-03 DIAGNOSIS — Z1231 Encounter for screening mammogram for malignant neoplasm of breast: Secondary | ICD-10-CM

## 2018-06-13 ENCOUNTER — Other Ambulatory Visit: Payer: Self-pay | Admitting: Family Medicine

## 2018-06-18 ENCOUNTER — Other Ambulatory Visit: Payer: Self-pay | Admitting: *Deleted

## 2018-06-18 MED ORDER — ROSUVASTATIN CALCIUM 10 MG PO TABS: 10.0000 mg | ORAL_TABLET | Freq: Every day | ORAL | 1 refills | Status: DC

## 2018-07-01 DIAGNOSIS — G245 Blepharospasm: Secondary | ICD-10-CM | POA: Diagnosis not present

## 2018-07-07 ENCOUNTER — Other Ambulatory Visit: Payer: Self-pay | Admitting: Family Medicine

## 2018-07-08 ENCOUNTER — Ambulatory Visit: Payer: Medicare Other

## 2018-08-05 ENCOUNTER — Ambulatory Visit: Payer: Medicare Other | Admitting: Family Medicine

## 2018-08-06 ENCOUNTER — Other Ambulatory Visit: Payer: Self-pay

## 2018-08-06 ENCOUNTER — Encounter: Payer: Self-pay | Admitting: Family Medicine

## 2018-08-06 ENCOUNTER — Ambulatory Visit (INDEPENDENT_AMBULATORY_CARE_PROVIDER_SITE_OTHER): Payer: Medicare Other | Admitting: Family Medicine

## 2018-08-06 VITALS — BP 158/100 | HR 80 | Temp 98.2°F | Resp 16 | Ht 64.0 in | Wt 160.0 lb

## 2018-08-06 DIAGNOSIS — B354 Tinea corporis: Secondary | ICD-10-CM | POA: Diagnosis not present

## 2018-08-06 DIAGNOSIS — M79604 Pain in right leg: Secondary | ICD-10-CM

## 2018-08-06 MED ORDER — MELOXICAM 15 MG PO TABS: 15.0000 mg | ORAL_TABLET | Freq: Every day | ORAL | 0 refills | Status: DC

## 2018-08-06 MED ORDER — TERBINAFINE HCL 250 MG PO TABS: 250.0000 mg | ORAL_TABLET | Freq: Every day | ORAL | 0 refills | Status: DC

## 2018-08-06 MED ORDER — GABAPENTIN 100 MG PO CAPS: 200.0000 mg | ORAL_CAPSULE | Freq: Three times a day (TID) | ORAL | 3 refills | Status: DC

## 2018-08-06 NOTE — Progress Notes (Signed)
Subjective:    Patient ID: Michelle Foster, female    DOB: [birth year], 68 y.o.   MRN: [id number]  HPI Patient is a 68 year old Caucasian female who presents today complaining of pain in her right leg.  She has been seeing an orthopedist who performed an MRI of her lumbar spine.  She did have a disc protrusion causing narrowing of the lateral recess at L4-L5.  Patient states she saw no benefit after 2 separate injections and was then referred to physical therapy.  At physical therapy, the pain became much worse.  She now complains of aching pain in her right leg radiating from the lateral aspect of her right thigh all the way down to her right knee.  It is the worse when she stands from a seated position.  She has to stand for quite some time for her leg to stop hurting.  She has to stand there and let the numbness and pain wear off before she can start to walk.  She reports a deep shooting aching pain radiating down her right leg from her hip to her knee.  She also has a rash located on the posterior aspect of her right calf.  It is approximately 7 cm in diameter.  It is a well-circumscribed erythematous patch with a serpiginous sharp border with central scale.  She has been trying Lotrimin cream twice daily for 1 week and it has not improved.  In fact she thinks it may be spreading Past Surgical History:  Procedure Laterality Date  . ABDOMINAL HYSTERECTOMY     TAH BSO by accident at 68   . meniscal tear     right  . OVARY SURGERY     1982   Current Outpatient Medications on File Prior to Visit  Medication Sig Dispense Refill  . aspirin EC 81 MG tablet Take 81 mg by mouth daily.    . Cholecalciferol (VITAMIN D3) 5000 units CAPS Take by mouth.    . clotrimazole-betamethasone (LOTRISONE) cream Apply 1 application topically 2 (two) times daily. 30 g 0  . diclofenac (VOLTAREN) 75 MG EC tablet Take 1 tablet (75 mg total) by mouth 2 (two) times daily. 30 tablet 0  . fluticasone (FLONASE) 50  MCG/ACT nasal spray Place 2 sprays into both nostrils daily. 16 g 6  . ketoconazole (NIZORAL) 200 MG tablet 200 mg. Takes 2 tab the 1st of the month    . levocetirizine (XYZAL) 5 MG tablet TAKE 1 TABLET BY MOUTH EVERY DAY IN THE EVENING 90 tablet 3  . nitroGLYCERIN (NITROSTAT) 0.4 MG SL tablet Place 1 tablet (0.4 mg total) under the tongue every 5 (five) minutes as needed for chest pain (For a maximum of 3 pills; if you require more than 3 pills, call 911). 30 tablet 0  . rosuvastatin (CRESTOR) 10 MG tablet Take 1 tablet (10 mg total) by mouth daily. 90 tablet 1  . terbinafine (LAMISIL) 250 MG tablet Take 1 tablet (250 mg total) by mouth daily. 14 tablet 0   Current Facility-Administered Medications on File Prior to Visit  Medication Dose Route Frequency Provider Last Rate Last Dose  . denosumab (PROLIA) injection 60 mg  60 mg Subcutaneous Q6 months Susy Frizzle, MD   60 mg at 05/21/18 1230   Allergies  Allergen Reactions  . Codeine Nausea And Vomiting   Social History   Socioeconomic History  . Marital status: Married    Spouse name: Not on file  . Number of children: Not  on file  . Years of education: Not on file  . Highest education level: Not on file  Occupational History  . Not on file  Social Needs  . Financial resource strain: Not on file  . Food insecurity:    Worry: Not on file    Inability: Not on file  . Transportation needs:    Medical: Not on file    Non-medical: Not on file  Tobacco Use  . Smoking status: Never Smoker  . Smokeless tobacco: Never Used  Substance and Sexual Activity  . Alcohol use: No    Frequency: Never  . Drug use: No  . Sexual activity: Not on file  Lifestyle  . Physical activity:    Days per week: Not on file    Minutes per session: Not on file  . Stress: Not on file  Relationships  . Social connections:    Talks on phone: Not on file    Gets together: Not on file    Attends religious service: Not on file    Active member of  club or organization: Not on file    Attends meetings of clubs or organizations: Not on file    Relationship status: Not on file  . Intimate partner violence:    Fear of current or ex partner: Not on file    Emotionally abused: Not on file    Physically abused: Not on file    Forced sexual activity: Not on file  Other Topics Concern  . Not on file  Social History Narrative  . Not on file   Family History  Problem Relation Age of Onset  . Cancer Mother 42  . Heart failure Father   . Heart failure Brother   . Cancer Brother       Review of Systems  All other systems reviewed and are negative.      Objective:   Physical Exam  Constitutional: She appears well-developed and well-nourished. No distress.  Neck: Normal range of motion. Neck supple.  Cardiovascular: Normal rate, regular rhythm, normal heart sounds and intact distal pulses. Exam reveals no gallop and no friction rub.  No murmur heard. Pulmonary/Chest: Effort normal and breath sounds normal. No respiratory distress. She has no wheezes. She has no rales. She exhibits no tenderness.  Abdominal: Soft. Bowel sounds are normal. She exhibits no distension and no mass. There is no abdominal tenderness. There is no rebound and no guarding.  Musculoskeletal:        General: Edema present.     Right knee: She exhibits normal range of motion, no swelling and no effusion. No medial joint line and no lateral joint line tenderness noted.     Lumbar back: She exhibits normal range of motion and no pain.       Legs:  Skin: Rash noted. Rash is macular. She is not diaphoretic.  Psychiatric: She has a normal mood and affect.  Vitals reviewed.         Assessment & Plan:  Right leg pain  Tinea corporis  I do not believe the pain in her right leg is due to the swelling in her right leg from chronic venous insufficiency.  That was her concern.  The patient has trace edema in both legs up to the knee but not substantial.  The  pain sounds more radicular in nature and possibly neuropathic.  Her right knee exam is completely normal.  Therefore I believe this is likely lumbar radiculopathy versus IT band syndrome.  I have recommended trying meloxicam 15 mg a day coupled with gabapentin 200 mg every 8 hours as needed for nerve pain and see if her symptoms improve.  I also recommended that she follow back up with her orthopedist.  I do not believe this is knee pain.  I will treat the rash on her posterior right calf with Lamisil 250 mg p.o. daily for 7 days and up to 14 days if necessary

## 2018-08-08 DIAGNOSIS — M5416 Radiculopathy, lumbar region: Secondary | ICD-10-CM | POA: Diagnosis not present

## 2018-08-08 DIAGNOSIS — M25551 Pain in right hip: Secondary | ICD-10-CM | POA: Diagnosis not present

## 2018-08-19 ENCOUNTER — Ambulatory Visit: Payer: Medicare Other

## 2018-08-22 DIAGNOSIS — M25559 Pain in unspecified hip: Secondary | ICD-10-CM | POA: Diagnosis not present

## 2018-08-26 DIAGNOSIS — M545 Low back pain: Secondary | ICD-10-CM | POA: Diagnosis not present

## 2018-08-26 DIAGNOSIS — M5416 Radiculopathy, lumbar region: Secondary | ICD-10-CM | POA: Diagnosis not present

## 2018-08-29 DIAGNOSIS — M545 Low back pain: Secondary | ICD-10-CM | POA: Diagnosis not present

## 2018-08-29 DIAGNOSIS — M5416 Radiculopathy, lumbar region: Secondary | ICD-10-CM | POA: Diagnosis not present

## 2018-08-30 ENCOUNTER — Ambulatory Visit (INDEPENDENT_AMBULATORY_CARE_PROVIDER_SITE_OTHER): Payer: Medicare Other | Admitting: Gastroenterology

## 2018-08-30 ENCOUNTER — Encounter: Payer: Self-pay | Admitting: Gastroenterology

## 2018-08-30 ENCOUNTER — Other Ambulatory Visit: Payer: Self-pay

## 2018-08-30 ENCOUNTER — Ambulatory Visit (INDEPENDENT_AMBULATORY_CARE_PROVIDER_SITE_OTHER): Payer: Medicare Other | Admitting: Cardiovascular Disease

## 2018-08-30 ENCOUNTER — Encounter: Payer: Self-pay | Admitting: Cardiovascular Disease

## 2018-08-30 VITALS — Ht 64.0 in | Wt 160.0 lb

## 2018-08-30 VITALS — BP 128/72 | HR 75 | Temp 97.8°F | Ht 64.0 in | Wt 156.0 lb

## 2018-08-30 DIAGNOSIS — K219 Gastro-esophageal reflux disease without esophagitis: Secondary | ICD-10-CM

## 2018-08-30 DIAGNOSIS — E782 Mixed hyperlipidemia: Secondary | ICD-10-CM

## 2018-08-30 DIAGNOSIS — Z86010 Personal history of colonic polyps: Secondary | ICD-10-CM | POA: Diagnosis not present

## 2018-08-30 DIAGNOSIS — Z87898 Personal history of other specified conditions: Secondary | ICD-10-CM | POA: Diagnosis not present

## 2018-08-30 DIAGNOSIS — R194 Change in bowel habit: Secondary | ICD-10-CM

## 2018-08-30 DIAGNOSIS — R0789 Other chest pain: Secondary | ICD-10-CM

## 2018-08-30 DIAGNOSIS — R600 Localized edema: Secondary | ICD-10-CM

## 2018-08-30 DIAGNOSIS — R0609 Other forms of dyspnea: Secondary | ICD-10-CM | POA: Diagnosis not present

## 2018-08-30 NOTE — Assessment & Plan Note (Signed)
History of bilateral lower extremity dependent edema.  She has tried compression stockings in the past.  She has been told that she has venous reflux.  We will obtain venous reflux studies.

## 2018-08-30 NOTE — Assessment & Plan Note (Signed)
History of hyperlipidemia not on statin therapy with recent lipid profile performed 03/08/2017 revealing total cholesterol of 203, HDL 38.

## 2018-08-30 NOTE — Assessment & Plan Note (Signed)
History of dyspnea on exertion with normal 2D echo performed 04/02/2017 with grade 1 diastolic dysfunction.

## 2018-08-30 NOTE — Progress Notes (Addendum)
This patient contacted our office requesting a physician telemedicine consultation regarding clinical questions and/or test results.  If new patient, they were referred by Dr. Jenna Luo  Participants on the conference : myself and patient   The patient consented to this consultation and was aware that a charge will be placed through their insurance.  I was in my office and the patient was passenger in a car, which she and her husband riding home from a visit in [place].   Encounter time:  Total time 29 minutes, with 21 minutes spent with patient on D[employer]   _____________________________________________________________________________________________              [facility] Consult Note:  History: Michelle Foster 08/30/2018  Referring provider: Susy Frizzle, MD  Reason for consult/chief complaint: Colonoscopy (hx of colon polyps )   Subjective  HPI:  PCP note 05/21/18 notes years of intermittent diarrhea with food triggers.  Requested celiac testing. Reported colon polyp in [place] > 5 years ago, advised to have repeat in 5 years.  Michelle Foster was unable to get those reports.  Michelle Foster says that on further consideration, she believes that colonoscopy was probably 9 years ago, and there was at least one polyp removed.  In the last few months she has had onset of intermittent reflux symptoms with regurgitation and pyrosis, seems to be under control with as needed use of Rolaids.  She denies nausea vomiting early satiety or weight loss.  For about the last year, since a back injury, she has noticed a change in bowel habits.  She would previously go about twice a week, now goes 1-2 times a day and it is semi-formed.  She has no other clear triggers for that, no new medicines.  Bloating, but no abdominal pain and no rectal bleeding.  ROS:  Review of Systems  She denies chest pain dyspnea or dysuria Approximately 20 pound weight gain in the last year.  Past Medical History: Past Medical History:  Diagnosis Date  . Colon polyps   . Estrogen deficiency   . Eustachian tube dysfunction   . Hyperlipidemia   . Osteoporosis      Past Surgical History: Past Surgical History:  Procedure Laterality Date  . ABDOMINAL HYSTERECTOMY     TAH BSO by accident at 80   . COLONOSCOPY  2012   done in [place], physician has since retired  . meniscal tear     right  . OOPHORECTOMY    . OVARY SURGERY     1982     Family History: Family History  Problem Relation Age of Onset  . Breast cancer Mother 44  . Heart disease Mother   . Heart failure Father   . Heart disease Father   . Atrial fibrillation Father   . Stroke Father 10  . Heart failure Brother   . Brain cancer Brother   . Stomach cancer Neg Hx   . Colon cancer Neg Hx   . Pancreatic cancer Neg Hx   . Esophageal cancer Neg Hx     Social History: Social History   Socioeconomic History  . Marital status: Married    Spouse name: Not on file  . Number of children: Not on file  . Years of education: Not on file  . Highest education level: Not on file  Occupational History  . Not on file  Social Needs  . Financial resource strain: Not on file  . Food insecurity    Worry: Not on file  Inability: Not on file  . Transportation needs    Medical: Not on file    Non-medical: Not on file  Tobacco Use  . Smoking status: Never Smoker  . Smokeless tobacco: Never Used  Substance and Sexual Activity  . Alcohol use: No    Frequency: Never  . Drug use: No  . Sexual activity: Not on file  Lifestyle  . Physical activity    Days per week: Not on file    Minutes per session: Not on file  . Stress: Not on file  Relationships  . Social Herbalist on phone: Not on file    Gets together: Not on file    Attends religious service: Not on file    Active member of club or organization: Not on file    Attends meetings of clubs or organizations: Not on file    Relationship  status: Not on file  Other Topics Concern  . Not on file  Social History Narrative  . Not on file    Allergies: Allergies  Allergen Reactions  . Codeine Nausea And Vomiting    Outpatient Meds: Current Outpatient Medications  Medication Sig Dispense Refill  . acetaminophen (TYLENOL) 325 MG tablet Take 650 mg by mouth every 6 (six) hours as needed.    Marland Kitchen DICLOFENAC SODIUM ER PO Take 75 mg elemental calcium/kg/hr by mouth 2 (two) times a day.     Current Facility-Administered Medications  Medication Dose Route Frequency Provider Last Rate Last Dose  . denosumab (PROLIA) injection 60 mg  60 mg Subcutaneous Q6 months Susy Frizzle, MD   60 mg at 05/21/18 1230      ___________________________________________________________________ Objective   Exam:  Ht 5\' 4"  (1.626 m)   Wt 160 lb (72.6 kg)   BMI 27.46 kg/m    No exam-virtual visit  She is well-appearing, pleasant and conversational.  Labs:  Negative TTG IgA antibody and normal IgA level  Radiologic Studies:  Normal echocardiogram January 2019  Assessment: Encounter Diagnoses  Name Primary?  . Personal history of colonic polyps Yes  . Gastroesophageal reflux disease, esophagitis presence not specified   . Change in bowel habits     Prior records not available, but based on her recollection of previous findings, she is overdue for surveillance colonoscopy.  Her previous physician in [place] is retired, so she was unable to obtain the reports. Change in bowel habits of unclear cause.  Will discuss further with patient when she comes for colonoscopy, possibly take biopsies for microscopic colitis.  Recent onset of reflux symptoms, no red flag signs.  Perhaps related to some slow weight gain over the last year.  Recommended diet and lifestyle changes and consideration of H2 blocker acid suppression.  Plan:  My office will contact her to schedule a surveillance colonoscopy with me in our endoscopy lab.  Thank  you for the courtesy of this consult.  Please call me with any questions or concerns.  Michelle Foster III  CC: Referring provider noted above

## 2018-08-30 NOTE — Addendum Note (Signed)
Addended by: Nelida Meuse on: 08/30/2018 01:54 PM   Modules accepted: Level of Service

## 2018-08-30 NOTE — Patient Instructions (Signed)
Medication Instructions:  Your physician recommends that you continue on your current medications as directed. Please refer to the Current Medication list given to you today.  If you need a refill on your cardiac medications before your next appointment, please call your pharmacy.   Lab work: NONE If you have labs (blood work) drawn today and your tests are completely normal, you will receive your results only by: Marland Kitchen MyChart Message (if you have MyChart) OR . A paper copy in the mail If you have any lab test that is abnormal or we need to change your treatment, we will call you to review the results.  Testing/Procedures: Your physician has requested that you have a lower extremity venous reflux study. This test is an ultrasound of the veins and valves in the legs. It looks at venous blood flow that carries blood from the heart to the legs or arms. Allow one hour for a Lower Venous exam. Allow thirty minutes for an Upper Venous exam. There are no restrictions or special instructions.   Follow-Up: At [facility], you and your health needs are our priority.  As part of our continuing mission to provide you with exceptional heart care, we have created designated Provider Care Teams.  These Care Teams include your primary Cardiologist (physician) and Advanced Practice Providers (APPs -  Physician Assistants and Nurse Practitioners) who all work together to provide you with the care you need, when you need it. You will need a follow up appointment in 12 months with Dr. Gwenlyn Found.  Please call our office 2 months in advance to schedule this appointment.

## 2018-08-30 NOTE — Progress Notes (Signed)
08/30/2018 Michelle Foster   1950-04-27  [id number]  Primary Physician Dennard Schaumann, Cammie Mcgee, MD Primary Cardiologist: Lorretta Harp MD FACP, [place], [place], [place]  HPI:  Michelle Foster is a 68 y.o.  mildly overweight engaged Caucasian female mother of one, grandmother of 5 grandchildren who I last saw in the office 03/30/2017.  Marland Kitchen She was referred by the emergency room for cardiovascular evaluation because of recent episode of chest pain. Her only risk factors include treated hyperlipidemia and family history with father who had a myocardial infarction and a brother had bypass surgery in his early 28s expired. She apparently had a clean cardiac catheterization in [facility]. She is retired from being an Press photographer. She's also noticed increasing dyspnea on exertion over the last 3-4 months especially walking up stairs. She had onset of back pain radiating to her left jaw and chest last Friday and went to the emergency room. She received 4 sublingual glycerin which resulted in improvement in her symptoms. She ruled out for myocardial infarction. EKG showed no acute changes. She's had no recurrent symptoms.  I obtained a Myoview stress test 04/03/2017 which was entirely normal and a 2D echocardiogram at the same time which was normal as well except for some diastolic dysfunction.  She currently denies chest pain or shortness of breath.  Her major complaint is of bilateral lower extremity dependent edema attributed to venous reflux in the past.  She has tried compression stockings which were not helpful.    Current Meds  Medication Sig  . acetaminophen (TYLENOL) 325 MG tablet Take 650 mg by mouth every 6 (six) hours as needed.  Marland Kitchen DICLOFENAC SODIUM ER PO Take 75 mg elemental calcium/kg/hr by mouth 2 (two) times a day.   Current Facility-Administered Medications for the 08/30/18 encounter (Office Visit) with Lorretta Harp, MD  Medication  . denosumab (PROLIA) injection 60 mg      Allergies  Allergen Reactions  . Codeine Nausea And Vomiting    Social History   Socioeconomic History  . Marital status: Married    Spouse name: Not on file  . Number of children: Not on file  . Years of education: Not on file  . Highest education level: Not on file  Occupational History  . Not on file  Social Needs  . Financial resource strain: Not on file  . Food insecurity    Worry: Not on file    Inability: Not on file  . Transportation needs    Medical: Not on file    Non-medical: Not on file  Tobacco Use  . Smoking status: Never Smoker  . Smokeless tobacco: Never Used  Substance and Sexual Activity  . Alcohol use: No    Frequency: Never  . Drug use: No  . Sexual activity: Not on file  Lifestyle  . Physical activity    Days per week: Not on file    Minutes per session: Not on file  . Stress: Not on file  Relationships  . Social Herbalist on phone: Not on file    Gets together: Not on file    Attends religious service: Not on file    Active member of club or organization: Not on file    Attends meetings of clubs or organizations: Not on file    Relationship status: Not on file  . Intimate partner violence    Fear of current or ex partner: Not on file    Emotionally abused:  Not on file    Physically abused: Not on file    Forced sexual activity: Not on file  Other Topics Concern  . Not on file  Social History Narrative  . Not on file     Review of Systems: General: negative for chills, fever, night sweats or weight changes.  Cardiovascular: negative for chest pain, dyspnea on exertion, edema, orthopnea, palpitations, paroxysmal nocturnal dyspnea or shortness of breath Dermatological: negative for rash Respiratory: negative for cough or wheezing Urologic: negative for hematuria Abdominal: negative for nausea, vomiting, diarrhea, bright red blood per rectum, melena, or hematemesis Neurologic: negative for visual changes, syncope, or  dizziness All other systems reviewed and are otherwise negative except as noted above.    Blood pressure 128/72, pulse 75, temperature 97.8 F (36.6 C), height 5\' 4"  (1.626 m), weight 156 lb (70.8 kg).  General appearance: alert and no distress Neck: no adenopathy, no carotid bruit, no JVD, supple, symmetrical, trachea midline and thyroid not enlarged, symmetric, no tenderness/mass/nodules Lungs: clear to auscultation bilaterally Heart: regular rate and rhythm, S1, S2 normal, no murmur, click, rub or gallop Extremities: extremities normal, atraumatic, no cyanosis or edema Pulses: 2+ and symmetric Skin: Skin color, texture, turgor normal. No rashes or lesions Neurologic: Alert and oriented X 3, normal strength and tone. Normal symmetric reflexes. Normal coordination and gait  EKG normal sinus rhythm at 75 with nonspecific ST and T wave changes.  I personally reviewed this EKG.  ASSESSMENT AND PLAN:   Hyperlipidemia History of hyperlipidemia not on statin therapy with recent lipid profile performed 03/08/2017 revealing total cholesterol of 203, HDL 38.  Atypical chest pain History of atypical chest pain with remote normal cath back in 1995 and Myoview stress test performed 04/03/2017 that was entirely normal.  Dyspnea on exertion History of dyspnea on exertion with normal 2D echo performed 04/02/2017 with grade 1 diastolic dysfunction.  Bilateral lower extremity edema History of bilateral lower extremity dependent edema.  She has tried compression stockings in the past.  She has been told that she has venous reflux.  We will obtain venous reflux studies.      Lorretta Harp MD FACP,FACC,FAHA, [facility] 08/30/2018 12:10 PM

## 2018-08-30 NOTE — Assessment & Plan Note (Signed)
History of atypical chest pain with remote normal cath back in 1995 and Myoview stress test performed 04/03/2017 that was entirely normal.

## 2018-09-02 ENCOUNTER — Telehealth: Payer: Self-pay

## 2018-09-02 NOTE — Telephone Encounter (Signed)
-----   Message from [place], MD sent at 08/30/2018  2:10 PM EDT ----- Regarding this patient's colonoscopy to be scheduled, she reported prior intolerance of golytely (sounds like it was 4 liters , all night before , "old fashioned" prep.  If unable to get Suprep or Plenvu due to cost, then Miralax prep with ducolax will be fine. - HD

## 2018-09-07 ENCOUNTER — Other Ambulatory Visit: Payer: Self-pay

## 2018-09-07 ENCOUNTER — Ambulatory Visit
Admission: RE | Admit: 2018-09-07 | Discharge: 2018-09-07 | Disposition: A | Discharge status: Home / Self Care | Payer: Medicare Other | Source: Ambulatory Visit | Attending: Family Medicine | Admitting: Family Medicine

## 2018-09-07 DIAGNOSIS — Z1231 Encounter for screening mammogram for malignant neoplasm of breast: Secondary | ICD-10-CM

## 2018-09-09 ENCOUNTER — Other Ambulatory Visit: Payer: Self-pay

## 2018-09-09 ENCOUNTER — Telehealth: Payer: Self-pay | Admitting: Gastroenterology

## 2018-09-09 ENCOUNTER — Ambulatory Visit (HOSPITAL_COMMUNITY)
Admission: RE | Admit: 2018-09-09 | Discharge: 2018-09-09 | Disposition: A | Discharge status: Home / Self Care | Payer: Medicare Other | Source: Ambulatory Visit | Attending: Cardiovascular Disease | Admitting: Cardiovascular Disease

## 2018-09-09 DIAGNOSIS — G245 Blepharospasm: Secondary | ICD-10-CM | POA: Diagnosis not present

## 2018-09-09 DIAGNOSIS — R600 Localized edema: Secondary | ICD-10-CM | POA: Diagnosis not present

## 2018-09-09 MED ORDER — NA SULFATE-K SULFATE-MG SULF 17.5-3.13-1.6 GM/177ML PO SOLN: 1.0000 | Freq: Once | ORAL | 0 refills | Status: AC

## 2018-09-09 NOTE — Telephone Encounter (Signed)
Resent as requested.  

## 2018-09-09 NOTE — Telephone Encounter (Signed)
Pt is scheduled for upcoming colonoscopy and requested Suprep script sent to [facility] on [address] in [place].

## 2018-09-12 ENCOUNTER — Telehealth: Payer: Self-pay | Admitting: Gastroenterology

## 2018-09-12 NOTE — Telephone Encounter (Signed)

## 2018-09-12 NOTE — Telephone Encounter (Signed)
Pt answered "no" to all prescreening questions.

## 2018-09-13 ENCOUNTER — Ambulatory Visit (AMBULATORY_SURGERY_CENTER): Payer: Medicare Other | Admitting: Gastroenterology

## 2018-09-13 ENCOUNTER — Other Ambulatory Visit: Payer: Self-pay

## 2018-09-13 ENCOUNTER — Encounter: Payer: Self-pay | Admitting: Gastroenterology

## 2018-09-13 VITALS — BP 120/60 | HR 67 | Temp 97.7°F | Resp 21 | Ht 64.0 in | Wt 160.0 lb

## 2018-09-13 DIAGNOSIS — Z86010 Personal history of colonic polyps: Secondary | ICD-10-CM | POA: Diagnosis not present

## 2018-09-13 DIAGNOSIS — Z538 Procedure and treatment not carried out for other reasons: Secondary | ICD-10-CM

## 2018-09-13 DIAGNOSIS — Z1211 Encounter for screening for malignant neoplasm of colon: Secondary | ICD-10-CM | POA: Diagnosis not present

## 2018-09-13 MED ORDER — SODIUM CHLORIDE 0.9 % IV SOLN: 500.0000 mL | Freq: Once | INTRAVENOUS | Status: DC

## 2018-09-13 NOTE — Progress Notes (Signed)
A and O x3. Report to RN. Tolerated MAC anesthesia well.

## 2018-09-13 NOTE — Progress Notes (Signed)
VS taken by [facility] and Rica Mote

## 2018-09-13 NOTE — Op Note (Signed)
[place] Patient Name: Shantrell Placzek Procedure Date: 09/13/2018 3:24 PM MRN: [id number] Endoscopist: Mallie Mussel L. Loletha Carrow , MD Age: 68 Referring MD:  Date of Birth: 1950-05-20 Gender: Female Account #: [id number] Procedure:                Colonoscopy Indications:              Surveillance: Personal history of colonic polyps                            (unknown histology) on last colonoscopy more than 5                            years ago (patient reported polyps of unknown                            pathology 8-10 years prior - [place]) Medicines:                Monitored Anesthesia Care Procedure:                Pre-Anesthesia Assessment:                           - Prior to the procedure, a History and Physical                            was performed, and patient medications and                            allergies were reviewed. The patient's tolerance of                            previous anesthesia was also reviewed. The risks                            and benefits of the procedure and the sedation                            options and risks were discussed with the patient.                            All questions were answered, and informed consent                            was obtained. Prior Anticoagulants: The patient has                            taken no previous anticoagulant or antiplatelet                            agents. ASA Grade Assessment: II - A patient with                            mild systemic disease. After reviewing the risks  and benefits, the patient was deemed in                            satisfactory condition to undergo the procedure.                           After obtaining informed consent, the colonoscope                            was passed under direct vision. Throughout the                            procedure, the patient's blood pressure, pulse, and                            oxygen  saturations were monitored continuously. The                            Endoscope was introduced through the anus and                            advanced to the the sigmoid colon. The colonoscopy                            was extremely difficult due to a redundant and                            tortuous sigmoid colon. The patient tolerated the                            procedure well. The quality of the bowel                            preparation was good. No anatomical landmarks were                            photographed. Scope In: Scope Out: Findings:                 The perianal and digital rectal examinations were                            normal.                           The sigmoid colon was significantly tortuous and                            redundant. Attempts were made with regular and                            pediatric colonoscopes as well as a gastroscope,                            application of abdominal pressure, water inflation  and change of patient position. Despite that, the                            scope could not be advanced beyond the distal                            sigmoid colon. Complications:            No immediate complications. Estimated Blood Loss:     Estimated blood loss: none. Impression:               - Tortuous colon.                           - No specimens collected. Recommendation:           - Patient has a contact number available for                            emergencies. The signs and symptoms of potential                            delayed complications were discussed with the                            patient. Return to normal activities tomorrow.                            Written discharge instructions were provided to the                            patient.                           - Resume previous diet.                           - Continue present medications.                           - Referral  to advanced endoscopist for repeat                            colonoscopy. Bibiana Gillean L. Loletha Carrow, MD 09/13/2018 4:20:21 PM This report has been signed electronically.

## 2018-09-13 NOTE — Patient Instructions (Signed)

## 2018-09-17 ENCOUNTER — Telehealth: Payer: Self-pay | Admitting: *Deleted

## 2018-09-17 ENCOUNTER — Telehealth: Payer: Self-pay

## 2018-09-17 ENCOUNTER — Telehealth: Payer: Self-pay | Admitting: Gastroenterology

## 2018-09-17 NOTE — Telephone Encounter (Signed)
This patient had an incomplete colonoscopy with me on 09/13/18 for History of colon polyps.    Dr. Rush Landmark and I discussed it yesterday, and he is agreeable to performing an outpatient colonoscopy in one of his hospital blocks for this patient.  I spoke with Neesha today, she is feeling fine after the procedure last week, and is agreeable to having this done.  (routine, and she understands the backup of hospital cases lately)  Please contact her after the holiday weekend to make arrangements.

## 2018-09-17 NOTE — Telephone Encounter (Signed)
Message to call pt in a few weeks to set up hospital case

## 2018-09-17 NOTE — Telephone Encounter (Signed)
Called [phone] and left a messaged we tried to reach pt for a follow up call. maw

## 2018-09-17 NOTE — Telephone Encounter (Signed)
  Follow up Call-  Call back number 09/13/2018  Post procedure Call Back phone  # [phone]  Permission to leave phone message Yes  Some recent data might be hidden     Patient questions:  Do you have a fever, pain , or abdominal swelling? No. Pain Score  0 *  Have you tolerated food without any problems? Yes.    Have you been able to return to your normal activities? Yes.    Do you have any questions about your discharge instructions: Diet   No. Medications  No. Follow up visit  No.  Do you have questions or concerns about your Care? No.  Actions: * If pain score is 4 or above: No action needed, pain <4.  1. Have you developed a fever since your procedure? no  2.   Have you had an respiratory symptoms (SOB or cough) since your procedure? no  3.   Have you tested positive for COVID 19 since your procedure no  4.   Have you had any family members/close contacts diagnosed with the COVID 19 since your procedure?  no   If yes to any of these questions please route to Joylene John, RN and Alphonsa Gin, Therapist, sports.

## 2018-09-17 NOTE — Telephone Encounter (Signed)
Dr Mansouraty please advise  

## 2018-09-17 NOTE — Telephone Encounter (Signed)
Michelle Foster, [place] to be placed as a 90 minute slot for colonoscopy in the hospital. Whenever we have an availability and based on the patient's preference in the next 1-2 months. Please up date [place] when she has chosen a date. Thanks.

## 2018-10-08 ENCOUNTER — Other Ambulatory Visit: Payer: Self-pay

## 2018-10-08 ENCOUNTER — Telehealth: Payer: Self-pay

## 2018-10-08 DIAGNOSIS — Q438 Other specified congenital malformations of intestine: Secondary | ICD-10-CM

## 2018-10-08 DIAGNOSIS — Z86010 Personal history of colonic polyps: Secondary | ICD-10-CM

## 2018-10-08 MED ORDER — NA SULFATE-K SULFATE-MG SULF 17.5-3.13-1.6 GM/177ML PO SOLN: 1.0000 | Freq: Once | ORAL | 0 refills | Status: AC

## 2018-10-08 NOTE — Telephone Encounter (Signed)
-----   Message from Timothy Lasso, RN sent at 09/17/2018  3:06 PM EDT ----- Chong Sicilian, [place] to be placed as a 90 minute slot for colonoscopy in the hospital. Whenever we have an availability and based on the patient's preference in the next 1-2 months. Please up date [place] when she has chosen a date. Thanks.   Documentation   You routed conversation to Mansouraty, Telford Nab., MD 2 hours ago (12:47 PM)  You 2 hours ago (12:47 PM)     Dr Rush Landmark please advise    Documentation   Danis, Kirke Corin, MD routed conversation to You 2 hours ago (12:35 PM)  Doran Stabler, MD 2 hours ago (12:35 PM)     This patient had an incomplete colonoscopy with me on 09/13/18 for History of colon polyps.    Dr. Rush Landmark and I discussed it yesterday, and he is agreeable to performing an outpatient colonoscopy in one of his hospital blocks for this patient.  I spoke with Tamee today, she is feeling fine after the procedure last week, and is agreeable to having this done.  (routine, and she understands the backup of hospital cases lately)  Please contact her after the holiday weekend to make arrangements.

## 2018-10-08 NOTE — Telephone Encounter (Signed)
The pt has been scheduled for a colonoscopy with Dr Jerilynn Mages at [facility] on 11/13/18.  She has been instructed and aware of the COVID testing.  Prep has been sent to the pharmacy.  Appt made for COVID testing.  Instructions available on My Chart and mailed to to the pt home as well.

## 2018-11-09 ENCOUNTER — Other Ambulatory Visit (HOSPITAL_COMMUNITY)
Admission: RE | Admit: 2018-11-09 | Discharge: 2018-11-09 | Disposition: A | Discharge status: Home / Self Care | Payer: Medicare Other | Source: Ambulatory Visit | Attending: Gastroenterology | Admitting: Gastroenterology

## 2018-11-09 DIAGNOSIS — Z20828 Contact with and (suspected) exposure to other viral communicable diseases: Secondary | ICD-10-CM | POA: Insufficient documentation

## 2018-11-09 DIAGNOSIS — Z01812 Encounter for preprocedural laboratory examination: Secondary | ICD-10-CM | POA: Insufficient documentation

## 2018-11-09 LAB — SARS CORONAVIRUS 2 (TAT 6-24 HRS): SARS Coronavirus 2: NEGATIVE

## 2018-11-12 ENCOUNTER — Encounter (HOSPITAL_COMMUNITY): Payer: Self-pay | Admitting: Emergency Medicine

## 2018-11-12 ENCOUNTER — Other Ambulatory Visit: Payer: Self-pay

## 2018-11-12 NOTE — Progress Notes (Signed)
Spoke with patient, states has been quarantined without symptoms since covid 19 testing.  Has started prep process, answered questions about checking into hospital.  Patient arrival time 0700 11/13/18

## 2018-11-12 NOTE — Anesthesia Preprocedure Evaluation (Addendum)
Anesthesia Evaluation  Patient identified by MRN, date of birth, ID band Patient awake    Reviewed: Allergy & Precautions, NPO status , Patient's Chart, lab work & pertinent test results  Airway Mallampati: II  TM Distance: >3 FB Neck ROM: Full    Dental  (+) Teeth Intact, Dental Advisory Given   Pulmonary neg pulmonary ROS,    breath sounds clear to auscultation       Cardiovascular negative cardio ROS   Rhythm:Regular Rate:Normal     Neuro/Psych negative neurological ROS  negative psych ROS   GI/Hepatic negative GI ROS, Neg liver ROS,   Endo/Other  negative endocrine ROS  Renal/GU      Musculoskeletal  (+) Arthritis ,   Abdominal Normal abdominal exam  (+)   Peds  Hematology negative hematology ROS (+)   Anesthesia Other Findings   Reproductive/Obstetrics                            Anesthesia Physical Anesthesia Plan  ASA: II  Anesthesia Plan: MAC   Post-op Pain Management:    Induction: Intravenous  PONV Risk Score and Plan: Propofol infusion  Airway Management Planned: Simple Face Mask and Natural Airway  Additional Equipment: None  Intra-op Plan:   Post-operative Plan:   Informed Consent: I have reviewed the patients History and Physical, chart, labs and discussed the procedure including the risks, benefits and alternatives for the proposed anesthesia with the patient or authorized representative who has indicated his/her understanding and acceptance.       Plan Discussed with: CRNA  Anesthesia Plan Comments:        Anesthesia Quick Evaluation

## 2018-11-12 NOTE — Progress Notes (Signed)
Endo pre-op call completed

## 2018-11-13 ENCOUNTER — Ambulatory Visit (HOSPITAL_COMMUNITY): Payer: Medicare Other | Admitting: Anesthesiology

## 2018-11-13 ENCOUNTER — Encounter (HOSPITAL_COMMUNITY): Payer: Self-pay | Admitting: Certified Registered"

## 2018-11-13 ENCOUNTER — Ambulatory Visit (HOSPITAL_COMMUNITY)
Admission: RE | Admit: 2018-11-13 | Discharge: 2018-11-13 | Disposition: A | Discharge status: Home / Self Care | Payer: Medicare Other | Attending: Gastroenterology | Admitting: Gastroenterology

## 2018-11-13 ENCOUNTER — Encounter (HOSPITAL_COMMUNITY)
Admission: RE | Disposition: A | Discharge status: Home / Self Care | Payer: Self-pay | Source: Home / Self Care | Attending: Gastroenterology

## 2018-11-13 DIAGNOSIS — K640 First degree hemorrhoids: Secondary | ICD-10-CM | POA: Insufficient documentation

## 2018-11-13 DIAGNOSIS — K56699 Other intestinal obstruction unspecified as to partial versus complete obstruction: Secondary | ICD-10-CM | POA: Diagnosis not present

## 2018-11-13 DIAGNOSIS — Z885 Allergy status to narcotic agent status: Secondary | ICD-10-CM | POA: Insufficient documentation

## 2018-11-13 DIAGNOSIS — Z86010 Personal history of colonic polyps: Secondary | ICD-10-CM | POA: Diagnosis not present

## 2018-11-13 DIAGNOSIS — K621 Rectal polyp: Secondary | ICD-10-CM | POA: Diagnosis not present

## 2018-11-13 DIAGNOSIS — D123 Benign neoplasm of transverse colon: Secondary | ICD-10-CM | POA: Insufficient documentation

## 2018-11-13 DIAGNOSIS — D125 Benign neoplasm of sigmoid colon: Secondary | ICD-10-CM | POA: Diagnosis not present

## 2018-11-13 DIAGNOSIS — M1990 Unspecified osteoarthritis, unspecified site: Secondary | ICD-10-CM | POA: Insufficient documentation

## 2018-11-13 DIAGNOSIS — K635 Polyp of colon: Secondary | ICD-10-CM

## 2018-11-13 DIAGNOSIS — N189 Chronic kidney disease, unspecified: Secondary | ICD-10-CM | POA: Diagnosis not present

## 2018-11-13 DIAGNOSIS — E785 Hyperlipidemia, unspecified: Secondary | ICD-10-CM | POA: Diagnosis not present

## 2018-11-13 DIAGNOSIS — Z1211 Encounter for screening for malignant neoplasm of colon: Secondary | ICD-10-CM | POA: Diagnosis not present

## 2018-11-13 DIAGNOSIS — Z8719 Personal history of other diseases of the digestive system: Secondary | ICD-10-CM | POA: Insufficient documentation

## 2018-11-13 DIAGNOSIS — Q438 Other specified congenital malformations of intestine: Secondary | ICD-10-CM | POA: Diagnosis not present

## 2018-11-13 DIAGNOSIS — D127 Benign neoplasm of rectosigmoid junction: Secondary | ICD-10-CM | POA: Diagnosis not present

## 2018-11-13 HISTORY — PX: POLYPECTOMY: SHX5525

## 2018-11-13 HISTORY — PX: COLONOSCOPY WITH PROPOFOL: SHX5780

## 2018-11-13 SURGERY — COLONOSCOPY WITH PROPOFOL
Anesthesia: Monitor Anesthesia Care

## 2018-11-13 MED ORDER — LACTATED RINGERS IV SOLN
INTRAVENOUS | Status: DC
  Administered 2018-11-13: 08:00:00 1000 mL via INTRAVENOUS

## 2018-11-13 MED ORDER — PROPOFOL 10 MG/ML IV BOLUS
INTRAVENOUS | Status: DC | PRN
  Administered 2018-11-13: 20 mg via INTRAVENOUS
  Administered 2018-11-13: 30 mg via INTRAVENOUS

## 2018-11-13 MED ORDER — LIDOCAINE 2% (20 MG/ML) 5 ML SYRINGE
INTRAMUSCULAR | Status: DC | PRN
  Administered 2018-11-13: 100 mg via INTRAVENOUS

## 2018-11-13 MED ORDER — PROPOFOL 10 MG/ML IV BOLUS
INTRAVENOUS | Status: AC
  Filled 2018-11-13: qty 40

## 2018-11-13 MED ORDER — SODIUM CHLORIDE 0.9 % IV SOLN: INTRAVENOUS | Status: DC

## 2018-11-13 MED ORDER — EPHEDRINE SULFATE-NACL 50-0.9 MG/10ML-% IV SOSY
PREFILLED_SYRINGE | INTRAVENOUS | Status: DC | PRN
  Administered 2018-11-13: 10 mg via INTRAVENOUS

## 2018-11-13 MED ORDER — LIDOCAINE VISCOUS HCL 2 % MT SOLN
OROMUCOSAL | Status: AC
  Filled 2018-11-13: qty 15

## 2018-11-13 MED ORDER — PROPOFOL 10 MG/ML IV BOLUS
INTRAVENOUS | Status: AC
  Filled 2018-11-13: qty 20

## 2018-11-13 MED ORDER — PROPOFOL 500 MG/50ML IV EMUL
INTRAVENOUS | Status: DC | PRN
  Administered 2018-11-13: 125 ug/kg/min via INTRAVENOUS

## 2018-11-13 SURGICAL SUPPLY — 21 items

## 2018-11-13 NOTE — Discharge Instructions (Signed)
YOU HAD AN ENDOSCOPIC PROCEDURE TODAY: Refer to the procedure report and other information in the discharge instructions given to you for any specific questions about what was found during the examination. If this information does not answer your questions, please call [place] office at [phone] to clarify.  ° °YOU SHOULD EXPECT: Some feelings of bloating in the abdomen. Passage of more gas than usual. Walking can help get rid of the air that was put into your GI tract during the procedure and reduce the bloating. If you had a lower endoscopy (such as a colonoscopy or flexible sigmoidoscopy) you may notice spotting of blood in your stool or on the toilet paper. Some abdominal soreness may be present for a day or two, also. ° °DIET: Your first meal following the procedure should be a light meal and then it is ok to progress to your normal diet. A half-sandwich or bowl of soup is an example of a good first meal. Heavy or fried foods are harder to digest and may make you feel nauseous or bloated. Drink plenty of fluids but you should avoid alcoholic beverages for 24 hours. If you had a esophageal dilation, please see attached instructions for diet.   ° °ACTIVITY: Your care partner should take you home directly after the procedure. You should plan to take it easy, moving slowly for the rest of the day. You can resume normal activity the day after the procedure however YOU SHOULD NOT DRIVE, use power tools, machinery or perform tasks that involve climbing or major physical exertion for 24 hours (because of the sedation medicines used during the test).  ° °SYMPTOMS TO REPORT IMMEDIATELY: °A gastroenterologist can be reached at any hour. Please call [phone]  for any of the following symptoms:  °Following lower endoscopy (colonoscopy, flexible sigmoidoscopy) °Excessive amounts of blood in the stool  °Significant tenderness, worsening of abdominal pains  °Swelling of the abdomen that is new, acute  °Fever of 100° or  higher  ° °Black, tarry-looking or red, bloody stools ° °FOLLOW UP:  °If any biopsies were taken you will be contacted by phone or by letter within the next 1-3 weeks. Call [phone]  if you have not heard about the biopsies in 3 weeks.  °Please also call with any specific questions about appointments or follow up tests.  °

## 2018-11-13 NOTE — Transfer of Care (Signed)
Immediate Anesthesia Transfer of Care Note  Patient: Michelle Foster  Procedure(s) Performed: COLONOSCOPY WITH PROPOFOL (N/A ) POLYPECTOMY  Patient Location: PACU  Anesthesia Type:MAC  Level of Consciousness: awake, alert  and oriented  Airway & Oxygen Therapy: Patient Spontanous Breathing and Patient connected to face mask oxygen  Post-op Assessment: Report given to RN and Post -op Vital signs reviewed and stable  Post vital signs: Reviewed and stable  Last Vitals:  Vitals Value Taken Time  BP 128/69 11/13/18 1009  Temp    Pulse 75 11/13/18 1009  Resp 19 11/13/18 1009  SpO2 98 % 11/13/18 1009  Vitals shown include unvalidated device data.  Last Pain:  Vitals:   11/13/18 1009  TempSrc: Oral  PainSc: 0-No pain         Complications: No apparent anesthesia complications

## 2018-11-13 NOTE — Anesthesia Postprocedure Evaluation (Signed)
Anesthesia Post Note  Patient: Michelle Foster  Procedure(s) Performed: COLONOSCOPY WITH PROPOFOL (N/A ) POLYPECTOMY     Patient location during evaluation: PACU Anesthesia Type: MAC Level of consciousness: awake and alert Pain management: pain level controlled Vital Signs Assessment: post-procedure vital signs reviewed and stable Respiratory status: spontaneous breathing, nonlabored ventilation, respiratory function stable and patient connected to nasal cannula oxygen Cardiovascular status: stable and blood pressure returned to baseline Postop Assessment: no apparent nausea or vomiting Anesthetic complications: no    Last Vitals:  Vitals:   11/13/18 1020 11/13/18 1030  BP: 139/77 (!) 149/79  Pulse: 73 75  Resp: 18 (!) 8  Temp:    SpO2: 95% 95%    Last Pain:  Vitals:   11/13/18 1009  TempSrc: Temporal  PainSc: 0-No pain                 [name]

## 2018-11-13 NOTE — Op Note (Addendum)
[facility] Patient [name] Procedure Date: 11/13/2018 MRN: [id number] Attending MD: Justice Britain , MD Date of Birth: 06-21-1950 CSN: [id number] Age: 68 Admit Type: Outpatient Procedure:                Colonoscopy Indications:              High risk colon cancer surveillance: Personal                            history of colonic polyps Providers:                Justice Britain, MD, Burtis Junes, RN, Elspeth Cho Tech., Technician Referring MD:             Estill Cotta. Loletha Carrow, MD Medicines:                Monitored Anesthesia Care Complications:            No immediate complications. Estimated Blood Loss:     Estimated blood loss was minimal. Procedure:                Pre-Anesthesia Assessment:                           - Prior to the procedure, a History and Physical                            was performed, and patient medications and                            allergies were reviewed. The patient's tolerance of                            previous anesthesia was also reviewed. The risks                            and benefits of the procedure and the sedation                            options and risks were discussed with the patient.                            All questions were answered, and informed consent                            was obtained. Prior Anticoagulants: The patient has                            taken no previous anticoagulant or antiplatelet                            agents. ASA Grade Assessment: II - A patient with  mild systemic disease. After reviewing the risks                            and benefits, the patient was deemed in                            satisfactory condition to undergo the procedure.                           After obtaining informed consent, the colonoscope                            was passed under direct vision. Throughout the                             procedure, the patient's blood pressure, pulse, and                            oxygen saturations were monitored continuously. The                            PCF-PH190L ([id number]) Olympus Ultra Slim                            colonoscope was introduced through the anus and                            advanced to the 2 cm into the ileum. The                            colonoscopy was extremely difficult due to                            restricted mobility of the colon, a redundant                            colon, significant looping and a tortuous colon.                            Successful completion of the procedure was aided by                            changing the patient to a supine position then to                            left lateral then to prone and eventually to                            right-lateral position and then periodically moving                            back to near prone position. Manual pressure by 2  individuals was also required. We withdrew and                            reinserted the scope multiple times to try and                            straightening and shortening the scope to obtain                            bowel loop reduction and using scope torsion.                            However at the end it required the near complete                            placement of the scope into the patient to reach                            the cecum and then slowly removing the loop. It                            took nearly 50 minutes to reach the cecum. The                            patient tolerated the procedure. The quality of the                            bowel preparation was good. The terminal ileum,                            ileocecal valve, appendiceal orifice, and rectum                            were photographed. Scope In: 8:36:17 AM Scope Out: 9:59:00 AM Scope Withdrawal Time: 0 hours 23 minutes 38 seconds   Total Procedure Duration: 1 hour 22 minutes 43 seconds  Findings:      The digital rectal exam findings include hemorrhoids. Pertinent       negatives include no palpable rectal lesions.      The terminal ileum and ileocecal valve appeared normal.      Three sessile polyps were found in the rectum, recto-sigmoid colon and       transverse colon. The polyps were 2 to 3 mm in size. These polyps were       removed with a piecemeal technique using a hot snare. Resection and       retrieval were complete.      The colon (entire examined portion) was grossly tortuous.      A benign-appearing, intrinsic narrowing with restricted mobility was       found at 20 cm within the recto-sigmoid colon and was traversed. No       evidence of diverticulosis here I suspect this is adhesive disease       related.      Normal mucosa was found in the entire colon otherwise.  Non-bleeding non-thrombosed external and internal hemorrhoids were found       during retroflexion, during perianal exam and during digital exam. The       hemorrhoids were Grade I (internal hemorrhoids that do not prolapse). Impression:               - Hemorrhoids found on digital rectal exam.                           - The examined portion of the ileum was normal.                           - Three 2 to 3 mm polyps in the rectum, at the                            recto-sigmoid colon and in the transverse colon,                            removed piecemeal using a hot snare. Resected and                            retrieved.                           - Tortuous colon. Restricted narrowing and mobility                            noted within the recto-sigmoid colon.                           - Normal mucosa in the entire examined colon                            otherwise.                           - Non-bleeding non-thrombosed external and internal                            hemorrhoids. Moderate Sedation:      Not Applicable -  Patient had care per Anesthesia. Recommendation:           - The patient will be observed post-procedure,                            until all discharge criteria are met.                           - Discharge patient to home.                           - Patient has a contact number available for                            emergencies. The signs and symptoms of potential  delayed complications were discussed with the                            patient. Return to normal activities tomorrow.                            Written discharge instructions were provided to the                            patient.                           - High fiber diet.                           - Continue present medications.                           - Await pathology results.                           - Repeat colonoscopy in 3 - 5 years for                            surveillance based on pathology results and                            findings of adenomatous tissue and if all tissue is                            not adenomatous, would still recommend 5-year                            follow up. Ultra-slim colonoscope and consider a                            Colowrap and starting in Prone or Right-lateral                            positioning for success. Needs to be done in the                            hospital based setting with CO2 (90 minute slot).                           - The findings and recommendations were discussed                            with the patient.                           - The findings and recommendations were discussed                            with the patient's family. Procedure Code(s):        ---  Professional ---                           [phone], Colonoscopy, flexible; with removal of                            tumor(s), polyp(s), or other lesion(s) by snare                            technique Diagnosis Code(s):        --- Professional ---                            Z86.010, Personal history of colonic polyps                           K64.0, First degree hemorrhoids                           K62.1, Rectal polyp                           K63.5, Polyp of colon                           K56.699, Other intestinal obstruction unspecified                            as to partial versus complete obstruction                           Q43.8, Other specified congenital malformations of                            intestine CPT copyright 2019 [employer]. All rights reserved. The codes documented in this report are preliminary and upon coder review may  be revised to meet current compliance requirements. Justice Britain, MD 11/13/2018 10:36:21 AM Number of Addenda: 0

## 2018-11-13 NOTE — H&P (Signed)
GASTROENTEROLOGY PROCEDURE H&P NOTE   Primary Care Physician: Susy Frizzle, MD  HPI: Michelle Foster is a 68 y.o. female who presents for Colonoscopy.  Past Medical History:  Diagnosis Date  . Allergy   . Arthritis   . Chronic kidney disease    kidney stones in 1985, tumor on uretha,   . Colon polyps   . Estrogen deficiency   . Eustachian tube dysfunction   . Heart murmur   . Hyperlipidemia    no meds  . Osteoporosis    Past Surgical History:  Procedure Laterality Date  . ABDOMINAL HYSTERECTOMY     TAH BSO by accident at 68   . COLONOSCOPY  2012   done in [place], physician has since retired  . meniscal tear     right  . OOPHORECTOMY    . OVARY SURGERY     1982   No current facility-administered medications for this encounter.    Allergies  Allergen Reactions  . Codeine Nausea And Vomiting   Family History  Problem Relation Age of Onset  . Breast cancer Mother 83  . Heart disease Mother   . Heart failure Father   . Heart disease Father   . Atrial fibrillation Father   . Stroke Father 81  . Heart failure Brother   . Brain cancer Brother   . Stomach cancer Neg Hx   . Colon cancer Neg Hx   . Pancreatic cancer Neg Hx   . Esophageal cancer Neg Hx    Social History   Socioeconomic History  . Marital status: Married    Spouse name: Not on file  . Number of children: Not on file  . Years of education: Not on file  . Highest education level: Not on file  Occupational History  . Not on file  Social Needs  . Financial resource strain: Not on file  . Food insecurity    Worry: Not on file    Inability: Not on file  . Transportation needs    Medical: Not on file    Non-medical: Not on file  Tobacco Use  . Smoking status: Never Smoker  . Smokeless tobacco: Never Used  Substance and Sexual Activity  . Alcohol use: No    Frequency: Never  . Drug use: No  . Sexual activity: Not on file  Lifestyle  . Physical activity    Days per week: Not  on file    Minutes per session: Not on file  . Stress: Not on file  Relationships  . Social Herbalist on phone: Not on file    Gets together: Not on file    Attends religious service: Not on file    Active member of club or organization: Not on file    Attends meetings of clubs or organizations: Not on file    Relationship status: Not on file  . Intimate partner violence    Fear of current or ex partner: Not on file    Emotionally abused: Not on file    Physically abused: Not on file    Forced sexual activity: Not on file  Other Topics Concern  . Not on file  Social History Narrative  . Not on file    Physical Exam: Vital signs in last 24 hours: Weight:  [71.7 kg] 71.7 kg (08/25 1251)   GEN: NAD EYE: Sclerae anicteric ENT: MMM CV: Non-tachycardic GI: Soft, NT/ND NEURO:  Alert & Oriented x 3  Lab Results:  No results for input(s): WBC, HGB, HCT, PLT in the last 72 hours. BMET No results for input(s): NA, K, CL, CO2, GLUCOSE, BUN, CREATININE, CALCIUM in the last 72 hours. LFT No results for input(s): PROT, ALBUMIN, AST, ALT, ALKPHOS, BILITOT, BILIDIR, IBILI in the last 72 hours. PT/INR No results for input(s): LABPROT, INR in the last 72 hours.   Impression / Plan: This is a 68 y.o.female who presents for Colonoscopy.  The risks and benefits of endoscopic evaluation were discussed with the patient; these include but are not limited to the risk of perforation, infection, bleeding, missed lesions, lack of diagnosis, severe illness requiring hospitalization, as well as anesthesia and sedation related illnesses.  The patient is agreeable to proceed.    Justice Britain, MD [facility] # [phone]

## 2018-11-14 ENCOUNTER — Encounter (HOSPITAL_COMMUNITY): Payer: Self-pay | Admitting: Gastroenterology

## 2018-11-15 ENCOUNTER — Encounter: Payer: Self-pay | Admitting: Gastroenterology

## 2018-11-18 DIAGNOSIS — G245 Blepharospasm: Secondary | ICD-10-CM | POA: Diagnosis not present

## 2018-11-18 DIAGNOSIS — H04123 Dry eye syndrome of bilateral lacrimal glands: Secondary | ICD-10-CM | POA: Diagnosis not present

## 2018-11-18 DIAGNOSIS — H2513 Age-related nuclear cataract, bilateral: Secondary | ICD-10-CM | POA: Diagnosis not present

## 2018-11-21 ENCOUNTER — Telehealth: Payer: Self-pay | Admitting: Gastroenterology

## 2018-11-21 NOTE — Telephone Encounter (Signed)
Pt relates mild constipation and intermittent mucous in stools following colonoscopy on 8/26. Colonoscopy report reviewed. She feels she has a UTI with cramping, pelvic area pain related to UTI. Advised to contact her PCP regarding UTI. Advised Miralax daily for mild constipation. Call office if GI symptoms persist. Dr. Loletha Carrow is her primary GI physician.

## 2018-11-27 ENCOUNTER — Telehealth: Payer: Self-pay | Admitting: Family Medicine

## 2018-11-27 ENCOUNTER — Other Ambulatory Visit: Payer: Medicare Other

## 2018-11-27 ENCOUNTER — Ambulatory Visit (INDEPENDENT_AMBULATORY_CARE_PROVIDER_SITE_OTHER): Payer: Medicare Other | Admitting: Family Medicine

## 2018-11-27 ENCOUNTER — Other Ambulatory Visit: Payer: Self-pay

## 2018-11-27 DIAGNOSIS — R319 Hematuria, unspecified: Secondary | ICD-10-CM

## 2018-11-27 DIAGNOSIS — R300 Dysuria: Secondary | ICD-10-CM

## 2018-11-27 DIAGNOSIS — M810 Age-related osteoporosis without current pathological fracture: Secondary | ICD-10-CM | POA: Diagnosis not present

## 2018-11-27 DIAGNOSIS — Z23 Encounter for immunization: Secondary | ICD-10-CM | POA: Diagnosis not present

## 2018-11-27 LAB — URINALYSIS, ROUTINE W REFLEX MICROSCOPIC
Bacteria, UA: NONE SEEN /HPF
Bilirubin Urine: NEGATIVE
Glucose, UA: NEGATIVE
Hyaline Cast: NONE SEEN /LPF
Ketones, ur: NEGATIVE
Leukocytes,Ua: NEGATIVE
Nitrite: NEGATIVE
Protein, ur: NEGATIVE
Specific Gravity, Urine: 1.015 (ref 1.001–1.03)
WBC, UA: NONE SEEN /HPF (ref 0–5)
pH: 6 (ref 5.0–8.0)

## 2018-11-27 LAB — MICROSCOPIC MESSAGE

## 2018-11-27 MED ORDER — NITROFURANTOIN MONOHYD MACRO 100 MG PO CAPS: 100.0000 mg | ORAL_CAPSULE | Freq: Two times a day (BID) | ORAL | 0 refills | Status: DC

## 2018-11-27 NOTE — Telephone Encounter (Signed)
Pt aware, med sent to pharm, not sure we can do a UC as lab has left - will check in the AM if we can add.

## 2018-11-27 NOTE — Telephone Encounter (Signed)
UA SHOWED some blood but otherwise normal, she had blood a few years ago as well Add urine culture see if true infection She can start Macrobid 100mg  BID for 3 days for now

## 2018-11-27 NOTE — Telephone Encounter (Signed)
Pt came in for flu shot and states that she is having some burning and increase pressure in her bladder and she took and otc test and it was positive and wanted to know if we could call her in an antibx? I told her we could run test and would let her know test results. She states that she is having burning and pressure with urination.

## 2018-11-28 NOTE — Telephone Encounter (Signed)
No culture done - pt to follow up if not better on antibx.

## 2018-12-30 ENCOUNTER — Other Ambulatory Visit: Payer: Self-pay | Admitting: Family Medicine

## 2019-02-18 DIAGNOSIS — G245 Blepharospasm: Secondary | ICD-10-CM | POA: Diagnosis not present

## 2019-05-01 ENCOUNTER — Telehealth (INDEPENDENT_AMBULATORY_CARE_PROVIDER_SITE_OTHER): Payer: Medicare Other | Admitting: Nurse Practitioner

## 2019-05-01 ENCOUNTER — Other Ambulatory Visit: Payer: Self-pay

## 2019-05-01 DIAGNOSIS — R82998 Other abnormal findings in urine: Secondary | ICD-10-CM

## 2019-05-01 DIAGNOSIS — N3001 Acute cystitis with hematuria: Secondary | ICD-10-CM | POA: Diagnosis not present

## 2019-05-01 DIAGNOSIS — R399 Unspecified symptoms and signs involving the genitourinary system: Secondary | ICD-10-CM

## 2019-05-01 MED ORDER — NITROFURANTOIN MONOHYD MACRO 100 MG PO CAPS: 100.0000 mg | ORAL_CAPSULE | Freq: Two times a day (BID) | ORAL | 0 refills | Status: DC

## 2019-05-01 NOTE — Progress Notes (Signed)
Virtual Visit via Telephone Note    I connected withSandra Foster on 05/01/2019 at 1200 by telephoneand verified that I am speaking with the correct person using two identifiers.  Pt location: at home   Physician location:  In office, [place], Ishmael Holter, FNP-C    On call: patient and physician   I discussed the limitations, risks, security and privacy concerns of performing an evaluation and management service by telephone and the availability of in person appointments. I also discussed with the patient that there may be a patient responsible charge related to this service. The patient expressed understanding and agreed to proceed.   History of Present Illness: Pt is a 69 y.o female who called in with onset of sxs 2 day ago. The sxs are urinary urgency, hesitancy, pink urine, foul odor to urine. She denied CVA pain, abd., pain, dysuria, or inability to void. No other GU/GI sxs, cp/ct, sob, other pain, sob, or falls. She reporting having UTI similar sxs before. Denies any fever/ chills/ bodyaches. She reported using AZO rand urine testing dip strip that was positive this morning for leukocytes and UTI. She has been treated in the past for UTI with Macrobid with good outcome and desires same today.   Observations/Objective: NAD noted, able to speak in full sentences but very congested sounding, harsh cough, no wheeze heard   Assessment and Plan: Your symptoms are consistent with urinary track infection.  1. Medications: nitrofurantoin 2. Maintain adequate hydration 3. Follow up if symptoms worsen, not improving, and prn  Follow Up Instructions:  I discussed the assessment and treatment plan with the patient. The patient was provided an opportunity to ask questions and all were answered. The patient agreed with the plan and demonstrated an understanding of the instructions.  The patient was advised to call back or seek an in-person evaluation if the  symptoms worsen or if the condition fails to improve as anticipated.  I provided 13 minutes of non-face-to-face time during this encounter. [place], FNP-C

## 2019-05-26 ENCOUNTER — Telehealth: Payer: Self-pay

## 2019-05-26 ENCOUNTER — Ambulatory Visit (INDEPENDENT_AMBULATORY_CARE_PROVIDER_SITE_OTHER): Payer: Medicare Other

## 2019-05-26 ENCOUNTER — Other Ambulatory Visit: Payer: Self-pay | Admitting: Family Medicine

## 2019-05-26 ENCOUNTER — Other Ambulatory Visit: Payer: Self-pay

## 2019-05-26 DIAGNOSIS — M810 Age-related osteoporosis without current pathological fracture: Secondary | ICD-10-CM

## 2019-05-26 MED ORDER — CIPROFLOXACIN HCL 250 MG PO TABS: 250.0000 mg | ORAL_TABLET | Freq: Two times a day (BID) | ORAL | 0 refills | Status: DC

## 2019-05-26 NOTE — Progress Notes (Deleted)
Pt came in today for injection and stated the macrobid is not helping with uti. Pt would like to know if you can send her in cipro. Pt states it helped. Please advise.

## 2019-05-26 NOTE — Telephone Encounter (Signed)
Pt came in today for an injection and stated the macrobid is not helping with her uti and would like to know if you can send in cipro. Pt states it helped in the past. Please advise.

## 2019-05-26 NOTE — Telephone Encounter (Signed)
I sent in cipro

## 2019-05-26 NOTE — Telephone Encounter (Signed)
Pt.notified

## 2019-05-28 ENCOUNTER — Ambulatory Visit: Payer: Self-pay

## 2019-06-02 DIAGNOSIS — G245 Blepharospasm: Secondary | ICD-10-CM | POA: Diagnosis not present

## 2019-06-17 DIAGNOSIS — M7581 Other shoulder lesions, right shoulder: Secondary | ICD-10-CM | POA: Diagnosis not present

## 2019-06-17 DIAGNOSIS — M7582 Other shoulder lesions, left shoulder: Secondary | ICD-10-CM | POA: Diagnosis not present

## 2019-07-01 ENCOUNTER — Other Ambulatory Visit: Payer: Self-pay | Admitting: Family Medicine

## 2019-07-01 NOTE — Telephone Encounter (Signed)
Ok to refill?  Medication is no longer on current list.   Patient has not had LFT checked since 11/2018.

## 2019-07-02 ENCOUNTER — Other Ambulatory Visit: Payer: Self-pay | Admitting: Family Medicine

## 2019-07-02 MED ORDER — CLOTRIMAZOLE-BETAMETHASONE 1-0.05 % EX CREA: 1.0000 "application " | TOPICAL_CREAM | Freq: Two times a day (BID) | CUTANEOUS | 0 refills | Status: DC

## 2019-07-03 ENCOUNTER — Telehealth: Payer: Self-pay | Admitting: Family Medicine

## 2019-07-03 NOTE — Telephone Encounter (Signed)
Need a pill for her fungus she has the cream [phone]

## 2019-07-04 ENCOUNTER — Other Ambulatory Visit: Payer: Self-pay | Admitting: Family Medicine

## 2019-07-04 MED ORDER — TERBINAFINE HCL 250 MG PO TABS: 250.0000 mg | ORAL_TABLET | Freq: Every day | ORAL | 0 refills | Status: DC

## 2019-07-04 NOTE — Telephone Encounter (Signed)
Pt aware.

## 2019-07-04 NOTE — Telephone Encounter (Signed)
I sent [facility]

## 2019-09-25 ENCOUNTER — Other Ambulatory Visit: Payer: Self-pay | Admitting: Family Medicine

## 2019-10-03 ENCOUNTER — Telehealth: Payer: Self-pay | Admitting: Family Medicine

## 2019-10-03 NOTE — Telephone Encounter (Signed)
CB# [phone] Pt having issues with her breast call the [facility] to schedule  was told need a referral

## 2019-10-06 NOTE — Telephone Encounter (Signed)
Has OV with PCP on 10/27/19  Annual Exam

## 2019-10-06 NOTE — Telephone Encounter (Signed)
NTBS in office

## 2019-10-08 ENCOUNTER — Telehealth: Payer: Self-pay | Admitting: Family Medicine

## 2019-10-08 NOTE — Telephone Encounter (Signed)
CB# [phone] Need a breast referral to be sent over at [facility] having problems with breast

## 2019-10-08 NOTE — Telephone Encounter (Signed)
Patient requires OV prior to ordering diagnostic mammogram.   Call placed to patient and patient made aware. Patient has appointment on 10/27/2019.

## 2019-10-23 ENCOUNTER — Other Ambulatory Visit: Payer: Self-pay | Admitting: Family Medicine

## 2019-10-23 DIAGNOSIS — Z1231 Encounter for screening mammogram for malignant neoplasm of breast: Secondary | ICD-10-CM

## 2019-10-24 ENCOUNTER — Ambulatory Visit
Admission: RE | Admit: 2019-10-24 | Discharge: 2019-10-24 | Disposition: A | Discharge status: Home / Self Care | Payer: Medicare Other | Source: Ambulatory Visit | Attending: Family Medicine | Admitting: Family Medicine

## 2019-10-24 ENCOUNTER — Other Ambulatory Visit: Payer: Self-pay

## 2019-10-24 DIAGNOSIS — Z1231 Encounter for screening mammogram for malignant neoplasm of breast: Secondary | ICD-10-CM

## 2019-10-27 ENCOUNTER — Other Ambulatory Visit: Payer: Self-pay

## 2019-10-27 ENCOUNTER — Encounter: Payer: Self-pay | Admitting: Family Medicine

## 2019-10-27 ENCOUNTER — Ambulatory Visit (INDEPENDENT_AMBULATORY_CARE_PROVIDER_SITE_OTHER): Payer: Medicare Other | Admitting: Family Medicine

## 2019-10-27 VITALS — BP 116/72 | HR 72 | Temp 97.4°F | Ht 64.0 in | Wt 158.0 lb

## 2019-10-27 DIAGNOSIS — Z0001 Encounter for general adult medical examination with abnormal findings: Secondary | ICD-10-CM

## 2019-10-27 DIAGNOSIS — Z23 Encounter for immunization: Secondary | ICD-10-CM | POA: Diagnosis not present

## 2019-10-27 DIAGNOSIS — Z0000 Encounter for general adult medical examination without abnormal findings: Secondary | ICD-10-CM

## 2019-10-27 DIAGNOSIS — M810 Age-related osteoporosis without current pathological fracture: Secondary | ICD-10-CM | POA: Diagnosis not present

## 2019-10-27 DIAGNOSIS — E7800 Pure hypercholesterolemia, unspecified: Secondary | ICD-10-CM | POA: Diagnosis not present

## 2019-10-27 LAB — CBC WITH DIFFERENTIAL/PLATELET
Absolute Monocytes: 543 cells/uL (ref 200–950)
Basophils Absolute: 80 cells/uL (ref 0–200)
Basophils Relative: 0.9 %
Eosinophils Absolute: 312 cells/uL (ref 15–500)
Eosinophils Relative: 3.5 %
HCT: 39.9 % (ref 35.0–45.0)
Hemoglobin: 13.4 g/dL (ref 11.7–15.5)
Lymphs Abs: 2777 cells/uL (ref 850–3900)
MCH: 31.8 pg (ref 27.0–33.0)
MCHC: 33.6 g/dL (ref 32.0–36.0)
MCV: 94.8 fL (ref 80.0–100.0)
MPV: 10.7 fL (ref 7.5–12.5)
Monocytes Relative: 6.1 %
Neutro Abs: 5189 cells/uL (ref 1500–7800)
Neutrophils Relative %: 58.3 %
Platelets: 355 10*3/uL (ref 140–400)
RBC: 4.21 10*6/uL (ref 3.80–5.10)
RDW: 12 % (ref 11.0–15.0)
Total Lymphocyte: 31.2 %
WBC: 8.9 10*3/uL (ref 3.8–10.8)

## 2019-10-27 LAB — COMPLETE METABOLIC PANEL WITH GFR
AG Ratio: 1.3 (calc) (ref 1.0–2.5)
ALT: 10 U/L (ref 6–29)
AST: 13 U/L (ref 10–35)
Albumin: 4.2 g/dL (ref 3.6–5.1)
Alkaline phosphatase (APISO): 56 U/L (ref 37–153)
BUN/Creatinine Ratio: 14 (calc) (ref 6–22)
BUN: 14 mg/dL (ref 7–25)
CO2: 26 mmol/L (ref 20–32)
Calcium: 10.3 mg/dL (ref 8.6–10.4)
Chloride: 105 mmol/L (ref 98–110)
Creat: 1.01 mg/dL — ABNORMAL HIGH (ref 0.50–0.99)
GFR, Est African American: 66 mL/min/{1.73_m2} (ref >=60)
GFR, Est Non African American: 57 mL/min/{1.73_m2} — ABNORMAL LOW (ref >=60)
Globulin: 3.3 g/dL (calc) (ref 1.9–3.7)
Glucose, Bld: 98 mg/dL (ref 65–99)
Potassium: 4.9 mmol/L (ref 3.5–5.3)
Sodium: 138 mmol/L (ref 135–146)
Total Bilirubin: 0.5 mg/dL (ref 0.2–1.2)
Total Protein: 7.5 g/dL (ref 6.1–8.1)

## 2019-10-27 LAB — LIPID PANEL
Cholesterol: 269 mg/dL — ABNORMAL HIGH (ref <200)
HDL: 36 mg/dL — ABNORMAL LOW (ref >=50)
LDL Cholesterol (Calc): 181 mg/dL (calc) — ABNORMAL HIGH
Non-HDL Cholesterol (Calc): 233 mg/dL (calc) — ABNORMAL HIGH (ref <130)
Total CHOL/HDL Ratio: 7.5 (calc) — ABNORMAL HIGH (ref <5.0)
Triglycerides: 304 mg/dL — ABNORMAL HIGH (ref <150)

## 2019-10-27 NOTE — Progress Notes (Signed)
Subjective:    Patient ID: Michelle Foster, female    DOB: June 04, 1950, 69 y.o.   MRN: [id number]  HPI Patient is a very pleasant 69 year old Caucasian female here today for complete physical exam.  Her last colonoscopy was in 2020.  They found 3 polyps.  She was also found to have a very tortuous colon and therefore they recommended a repeat colonoscopy in 3 years.  Patient just had her mammogram completed and it was normal.  She does not require a Pap smear due to her history of a hysterectomy.  She had a bone density test performed in 2019 that showed a T score of -2.8 confirming osteoporosis.  She is currently on Prolia.  She denies any falls, depression, or memory loss.  Immunizations are reviewed.  She is due for Prevnar 13.  She has had her Covid shot.  She is due for the shingles vaccine.  She is also due for a tetanus shot.  She declines a tetanus shot today.  She will check on the shingles vaccine.  She would like to receive the Prevnar 13 today. Past Medical History:  Diagnosis Date  . Allergy   . Arthritis   . Chronic kidney disease    kidney stones in 1985, tumor on uretha,   . Colon polyps   . Estrogen deficiency   . Eustachian tube dysfunction   . Heart murmur   . Hyperlipidemia    no meds  . Osteoporosis    Past Surgical History:  Procedure Laterality Date  . ABDOMINAL HYSTERECTOMY     TAH BSO by accident at 69   . COLONOSCOPY  2012   done in [place], physician has since retired  . COLONOSCOPY WITH PROPOFOL N/A 11/13/2018   Procedure: COLONOSCOPY WITH PROPOFOL;  Surgeon: Rush Landmark Telford Nab., MD;  Location: [facility];  Service: Gastroenterology;  Laterality: N/A;  NEEDS TO BE A 90 MIN SPOT  . meniscal tear     right  . OOPHORECTOMY    . [place]  . POLYPECTOMY  11/13/2018   Procedure: POLYPECTOMY;  Surgeon: Mansouraty, Telford Nab., MD;  Location: [facility];  Service: Gastroenterology;;   Current Outpatient Medications on File Prior to  Visit  Medication Sig Dispense Refill  . acetaminophen (TYLENOL) 325 MG tablet Take 650 mg by mouth every 6 (six) hours as needed.    . clotrimazole-betamethasone (LOTRISONE) cream Apply 1 application topically 2 (two) times daily. 30 g 0  . fluticasone (FLONASE) 50 MCG/ACT nasal spray SPRAY 2 SPRAYS INTO EACH NOSTRIL EVERY DAY 16 mL 6  . terbinafine (LAMISIL) 250 MG tablet Take 1 tablet (250 mg total) by mouth daily. 14 tablet 0   Current Facility-Administered Medications on File Prior to Visit  Medication Dose Route Frequency Provider Last Rate Last Admin  . denosumab (PROLIA) injection 60 mg  60 mg Subcutaneous Q6 months Susy Frizzle, MD   60 mg at 05/26/19 1015   Allergies  Allergen Reactions  . Codeine Nausea And Vomiting   Social History   Socioeconomic History  . Marital status: Married    Spouse name: Not on file  . Number of children: Not on file  . Years of education: Not on file  . Highest education level: Not on file  Occupational History  . Not on file  Tobacco Use  . Smoking status: Never Smoker  . Smokeless tobacco: Never Used  Vaping Use  . Vaping Use: Never used  Substance and  Sexual Activity  . Alcohol use: No  . Drug use: No  . Sexual activity: Not on file  Other Topics Concern  . Not on file  Social History Narrative  . Not on file   Social Determinants of Health   Financial Resource Strain:   . Difficulty of Paying Living Expenses:   Food Insecurity:   . Worried About Charity fundraiser in the Last Year:   . Arboriculturist in the Last Year:   Transportation Needs:   . Film/video editor (Medical):   Marland Kitchen Lack of Transportation (Non-Medical):   Physical Activity:   . Days of Exercise per Week:   . Minutes of Exercise per Session:   Stress:   . Feeling of Stress :   Social Connections:   . Frequency of Communication with Friends and Family:   . Frequency of Social Gatherings with Friends and Family:   . Attends Religious Services:     . Active Member of Clubs or Organizations:   . Attends Archivist Meetings:   Marland Kitchen Marital Status:   Intimate Partner Violence:   . Fear of Current or Ex-Partner:   . Emotionally Abused:   Marland Kitchen Physically Abused:   . Sexually Abused:    Family History  Problem Relation Age of Onset  . Breast cancer Mother 62  . Heart disease Mother   . Heart failure Father   . Heart disease Father   . Atrial fibrillation Father   . Stroke Father 61  . Heart failure Brother   . Brain cancer Brother   . Stomach cancer Neg Hx   . Colon cancer Neg Hx   . Pancreatic cancer Neg Hx   . Esophageal cancer Neg Hx       Review of Systems  All other systems reviewed and are negative.      Objective:   Physical Exam Vitals reviewed.  Constitutional:      General: She is not in acute distress.    Appearance: She is well-developed. She is not diaphoretic.  HENT:     Head: Normocephalic and atraumatic.     Right Ear: External ear normal.     Left Ear: External ear normal.     Nose: Nose normal.     Mouth/Throat:     Pharynx: No oropharyngeal exudate.  Eyes:     General: No scleral icterus.       Right eye: No discharge.        Left eye: No discharge.     Conjunctiva/sclera: Conjunctivae normal.     Pupils: Pupils are equal, round, and reactive to light.  Neck:     Thyroid: No thyromegaly.     Vascular: No JVD.  Cardiovascular:     Rate and Rhythm: Normal rate and regular rhythm.     Heart sounds: Normal heart sounds. No murmur heard.  No friction rub. No gallop.   Pulmonary:     Effort: Pulmonary effort is normal. No respiratory distress.     Breath sounds: Normal breath sounds. No stridor. No wheezing or rales.  Chest:     Chest wall: No tenderness.  Abdominal:     General: Bowel sounds are normal. There is no distension.     Palpations: Abdomen is soft. There is no mass.     Tenderness: There is no abdominal tenderness. There is no guarding or rebound.  Musculoskeletal:      Cervical back: Normal range of motion and neck supple.  Lymphadenopathy:     Cervical: No cervical adenopathy.  Neurological:     Mental Status: She is alert and oriented to person, place, and time.     Cranial Nerves: No cranial nerve deficit.     Motor: No abnormal muscle tone.     Coordination: Coordination normal.     Deep Tendon Reflexes: Reflexes are normal and symmetric.  Psychiatric:        Behavior: Behavior normal.        Thought Content: Thought content normal.        Judgment: Judgment normal.           Assessment & Plan:  Pure hypercholesterolemia - Plan: CBC with Differential/Platelet, COMPLETE METABOLIC PANEL WITH GFR, Lipid panel  General medical exam  Osteoporosis without current pathological fracture, unspecified osteoporosis type  I will schedule the patient for a bone density test.  Colonoscopy and mammogram are up-to-date.  I did recommend 1200 mg a day of calcium and 1000 units a day of vitamin D.  Patient received Prevnar 13 today.  I recommended the shingles vaccine.  She declined a tetanus shot.  She denies any falls, depression, or memory loss.  Check CBC, CMP, fasting lipid panel.

## 2019-10-28 ENCOUNTER — Other Ambulatory Visit: Payer: Self-pay

## 2019-10-28 DIAGNOSIS — E7800 Pure hypercholesterolemia, unspecified: Secondary | ICD-10-CM

## 2019-10-28 MED ORDER — ROSUVASTATIN CALCIUM 10 MG PO TABS: 10.0000 mg | ORAL_TABLET | Freq: Every day | ORAL | 3 refills | Status: DC

## 2019-11-05 ENCOUNTER — Other Ambulatory Visit: Payer: Self-pay | Admitting: Family Medicine

## 2019-11-05 DIAGNOSIS — M810 Age-related osteoporosis without current pathological fracture: Secondary | ICD-10-CM

## 2019-11-13 ENCOUNTER — Encounter: Payer: Self-pay | Admitting: *Deleted

## 2019-12-18 ENCOUNTER — Other Ambulatory Visit: Payer: Self-pay

## 2019-12-18 ENCOUNTER — Ambulatory Visit (INDEPENDENT_AMBULATORY_CARE_PROVIDER_SITE_OTHER): Payer: Medicare Other | Admitting: *Deleted

## 2019-12-18 DIAGNOSIS — Z23 Encounter for immunization: Secondary | ICD-10-CM

## 2019-12-18 DIAGNOSIS — M810 Age-related osteoporosis without current pathological fracture: Secondary | ICD-10-CM | POA: Diagnosis not present

## 2020-01-23 ENCOUNTER — Other Ambulatory Visit: Payer: Medicare Other

## 2020-02-17 ENCOUNTER — Encounter: Payer: Self-pay | Admitting: Family Medicine

## 2020-02-17 ENCOUNTER — Ambulatory Visit (INDEPENDENT_AMBULATORY_CARE_PROVIDER_SITE_OTHER): Payer: Medicare Other | Admitting: Family Medicine

## 2020-02-17 ENCOUNTER — Other Ambulatory Visit: Payer: Self-pay

## 2020-02-17 VITALS — BP 116/74 | HR 70 | Temp 97.9°F | Resp 14 | Ht 64.0 in | Wt 160.0 lb

## 2020-02-17 DIAGNOSIS — B354 Tinea corporis: Secondary | ICD-10-CM

## 2020-02-17 DIAGNOSIS — J019 Acute sinusitis, unspecified: Secondary | ICD-10-CM | POA: Diagnosis not present

## 2020-02-17 MED ORDER — LORATADINE 10 MG PO TABS
10.0000 mg | ORAL_TABLET | Freq: Every day | ORAL | 1 refills | Status: DC
Start: 2020-02-17 — End: 2020-03-10

## 2020-02-17 MED ORDER — AMOXICILLIN-POT CLAVULANATE 875-125 MG PO TABS
1.0000 | ORAL_TABLET | Freq: Two times a day (BID) | ORAL | 0 refills | Status: DC
Start: 2020-02-17 — End: 2020-04-08

## 2020-02-17 MED ORDER — CLOTRIMAZOLE-BETAMETHASONE 1-0.05 % EX CREA: 1.0000 "application " | TOPICAL_CREAM | Freq: Two times a day (BID) | CUTANEOUS | 0 refills | Status: DC

## 2020-02-17 MED ORDER — TERBINAFINE HCL 250 MG PO TABS: 250.0000 mg | ORAL_TABLET | Freq: Every day | ORAL | 0 refills | Status: DC

## 2020-02-17 MED ORDER — KETOCONAZOLE 2 % EX SHAM
1.0000 | MEDICATED_SHAMPOO | CUTANEOUS | 1 refills | Status: DC
Start: 2020-02-19 — End: 2020-04-19

## 2020-02-17 NOTE — Progress Notes (Signed)
   Subjective:    Patient ID: Michelle Foster, female    DOB: [birth year], 69 y.o.   MRN: [id number]  Patient presents for Illness (x1 month- sinus pressure, was seen at [facility] and given steroids, no relief) and Rash (fungal rash to back of leg)   Pt here with sinus pressure and drainge for 1 month, initially started while in [place] 1 month ago, allergies were bad. Went to a local UC, advised URI, given prednisone. She has used OTC sinus meds and flonase -flonase now starting to make nose bleed some   but no improvement in overall symptoms  She has sinus headaches with the symptoms  No significant cough or difficulty breathing, no fever, no loss of taste or smell covid vaccines UTD   She had a dermatitis on back on right leg and and buttocks, has had intermittanly for years and uses topical lotrisone and lamisil when it flares Has seen dermatology and has had 2 biopsies showing both fungal and eczema But it only clears with antifungal        Review Of Systems:  GEN- denies fatigue, fever, weight loss,weakness, recent illness HEENT- denies eye drainage, change in vision, +nasal discharge, CVS- denies chest pain, palpitations RESP- denies SOB, cough, wheeze ABD- denies N/V, change in stools, abd pain GU- denies dysuria, hematuria, dribbling, incontinence MSK- denies joint pain, muscle aches, injury Neuro- +headache,  Denies dizziness, syncope, seizure activity       Objective:    BP 116/74   Pulse 70   Temp 97.9 F (36.6 C) (Temporal)   Resp 14   Ht 5\' 4"  (1.626 m)   Wt 160 lb (72.6 kg)   SpO2 97%   BMI 27.46 kg/m  GEN- NAD, alert and oriented x3 HEENT- PERRL, EOMI, non injected sclera, pink conjunctiva, MMM, oropharynxclear , TM clear bilat no effusion,  + maxillary sinus tenderness, inflammed turbinates,  Nasal drainage  Neck- Supple, no LAD CVS- RRR, no murmur RESP-CTAB Skin- Right post leg large eczematous like rash with scaley erythematous skin, NT  EXT- No  edema Pulses- Radial 2+         Assessment & Plan:      Problem List Items Addressed This Visit    None    Visit Diagnoses    Tinea corporis    -  Primary   recurrent dermatitis rash with fungal elemenst, lamisil x 14 days, hold crestor, lotrisone, can use nizoral shampoo for maintance to skin    Relevant Medications   clotrimazole-betamethasone (LOTRISONE) cream   terbinafine (LAMISIL) 250 MG tablet   ketoconazole (NIZORAL) 2 % shampoo (Start on 02/19/2020)   Acute rhinosinusitis       Treat with augmentin, nasal saline, oral anti-histamine   Relevant Medications   terbinafine (LAMISIL) 250 MG tablet   amoxicillin-clavulanate (AUGMENTIN) 875-125 MG tablet   loratadine (CLARITIN) 10 MG tablet      Note: This dictation was prepared with Dragon dictation along with smaller phrase technology. Any transcriptional errors that result from this process are unintentional.

## 2020-02-17 NOTE — Patient Instructions (Addendum)
Use NIzoral shampoo twice a week for the affected areas  Hold CRestor WHILE on the lamisil tablets Take antibiotics , use zyrtec and nasal saline F/U as needed

## 2020-03-10 ENCOUNTER — Other Ambulatory Visit: Payer: Self-pay | Admitting: Family Medicine

## 2020-03-24 ENCOUNTER — Other Ambulatory Visit: Payer: Self-pay | Admitting: Family Medicine

## 2020-04-07 ENCOUNTER — Telehealth: Payer: Self-pay

## 2020-04-07 NOTE — Telephone Encounter (Signed)
Patient called with UTI symptoms. Burning, hard to void, and odor, urine is cloudy. Asked if something could be sent in.

## 2020-04-08 ENCOUNTER — Telehealth (INDEPENDENT_AMBULATORY_CARE_PROVIDER_SITE_OTHER): Payer: Medicare Other | Admitting: Nurse Practitioner

## 2020-04-08 ENCOUNTER — Other Ambulatory Visit: Payer: Self-pay

## 2020-04-08 ENCOUNTER — Other Ambulatory Visit: Payer: Self-pay | Admitting: Family Medicine

## 2020-04-08 ENCOUNTER — Encounter: Payer: Self-pay | Admitting: Nurse Practitioner

## 2020-04-08 DIAGNOSIS — R300 Dysuria: Secondary | ICD-10-CM | POA: Diagnosis not present

## 2020-04-08 MED ORDER — SULFAMETHOXAZOLE-TRIMETHOPRIM 800-160 MG PO TABS: 1.0000 | ORAL_TABLET | Freq: Two times a day (BID) | ORAL | 0 refills | Status: DC

## 2020-04-08 NOTE — Telephone Encounter (Signed)
I will send in abx 

## 2020-04-08 NOTE — Telephone Encounter (Signed)
Call placed to patient. LMTRC.  

## 2020-04-08 NOTE — Assessment & Plan Note (Signed)
Ongoing x 3 days.  Likely UTI, however unable to determine without urinalysis or urine culture.  Last urine culture was in 2018, reports history of UTI since then but have not been documented.  Would be very beneficial to have susceptibilities documented for future visits.  This was discussed with the patient.  Strongly encouraged patient to drop off urine sample to office.  Made aware that PCP had already sent in Bactrim for her to get started on.  Encouraged patient to notify clinic if symptoms are not improved by Monday.  With any worsening symptoms or nausea/vomiting and unable to keep fluids down, go to ER.

## 2020-04-08 NOTE — Progress Notes (Signed)
Subjective:    Patient ID: Michelle Foster, female    DOB: [birth year], 70 y.o.   MRN: [id number]  HPI: Michelle Foster is a 70 y.o. female presenting virtually for urinary tract infection symptoms.  Chief Complaint  Patient presents with  . Urinary Tract Infection    Foul odor  Burning w/ urination x 3 days   URINARY SYMPTOMS Thinks diet pill gave her UTI.  Does report history of UTIs, thinks last one was late last year. Duration: 3 days Dysuria: burning Urinary frequency: yes Urgency: yes Small volume voids: yes Symptom severity: moderate Urinary incontinence: no Foul odor: yes; cloudy as well Hematuria: sometimes looks orange Abdominal pain: no Back pain: no Suprapubic pain/pressure: no Flank pain: no Fever:  no  Nausea: no Vomiting: no Relief with cranberry juice: no Relief with pyridium: no Status: stable Previous urinary tract infection: yes Recurrent urinary tract infection: no Sexual activity: currently sexually active with 1 female partner, urinates after each time History of sexually transmitted disease: no Vaginal discharge: no; no vaginal bleeding Treatments attempted: AZO   Allergies  Allergen Reactions  . Codeine Nausea And Vomiting    Outpatient Encounter Medications as of 04/08/2020  Medication Sig  . acetaminophen (TYLENOL) 325 MG tablet Take 650 mg by mouth every 6 (six) hours as needed.  . clotrimazole-betamethasone (LOTRISONE) cream Apply 1 application topically 2 (two) times daily.  . fluticasone (FLONASE) 50 MCG/ACT nasal spray SPRAY 2 SPRAYS INTO EACH NOSTRIL EVERY DAY  . ketoconazole (NIZORAL) 2 % shampoo Apply 1 application topically 2 (two) times a week.  . loratadine (CLARITIN) 10 MG tablet TAKE 1 TABLET BY MOUTH EVERY DAY  . rosuvastatin (CRESTOR) 10 MG tablet Take 1 tablet (10 mg total) by mouth daily.  Marland Kitchen sulfamethoxazole-trimethoprim (BACTRIM DS) 800-160 MG tablet Take 1 tablet by mouth 2 (two) times daily.  Marland Kitchen  terbinafine (LAMISIL) 250 MG tablet Take 1 tablet (250 mg total) by mouth daily.  . [DISCONTINUED] amoxicillin-clavulanate (AUGMENTIN) 875-125 MG tablet Take 1 tablet by mouth 2 (two) times daily. (Patient not taking: Reported on 04/08/2020)   Facility-Administered Encounter Medications as of 04/08/2020  Medication  . denosumab (PROLIA) injection 60 mg    Patient Active Problem List   Diagnosis Date Noted  . Dysuria 04/08/2020  . Bilateral lower extremity edema 08/30/2018  . Hyperlipidemia 03/30/2017  . Atypical chest pain 03/30/2017  . Dyspnea on exertion 03/30/2017    Past Medical History:  Diagnosis Date  . Allergy   . Arthritis   . Chronic kidney disease    kidney stones in 1985, tumor on uretha,   . Colon polyps   . Estrogen deficiency   . Eustachian tube dysfunction   . Heart murmur   . Hyperlipidemia    no meds  . Osteoporosis     Relevant past medical, surgical, family and social history reviewed and updated as indicated. Interim medical history since our last visit reviewed.  Review of Systems  Constitutional: Negative.  Negative for activity change, fatigue and fever.  Gastrointestinal: Negative for nausea and vomiting.  Genitourinary: Positive for difficulty urinating, dysuria and frequency. Negative for decreased urine volume, enuresis, flank pain, hematuria, urgency, vaginal bleeding and vaginal discharge.  Skin: Negative.  Negative for color change and rash.  Neurological: Negative.   Psychiatric/Behavioral: Negative.     Per HPI unless specifically indicated above     Objective:    There were no vitals taken for this visit.  Wt Readings from Last  3 Encounters:  02/17/20 160 lb (72.6 kg)  10/27/19 158 lb (71.7 kg)  11/13/18 158 lb 1.1 oz (71.7 kg)    Physical Exam Physical examination unable to be performed due to lack of equipment and telemedicine visit.  Patient talking in complete sentences during telemedicine visit.     Assessment & Plan:    Problem List Items Addressed This Visit      Other   Dysuria - Primary    Ongoing x 3 days.  Likely UTI, however unable to determine without urinalysis or urine culture.  Last urine culture was in 2018, reports history of UTI since then but have not been documented.  Would be very beneficial to have susceptibilities documented for future visits.  This was discussed with the patient.  Strongly encouraged patient to drop off urine sample to office.  Made aware that PCP had already sent in Bactrim for her to get started on.  Encouraged patient to notify clinic if symptoms are not improved by Monday.  With any worsening symptoms or nausea/vomiting and unable to keep fluids down, go to ER.      Relevant Orders   Urinalysis, Routine w reflex microscopic   Urine Culture       Follow up plan: Return if symptoms worsen or fail to improve.  This visit was completed via telephone due to the restrictions of the COVID-19 pandemic. All issues as above were discussed and addressed but no physical exam was performed. If it was felt that the patient should be evaluated in the office, they were directed there. The patient verbally consented to this visit. Patient was unable to complete an audio/visual visit due to Lack of equipment. . Location of the patient: home . Location of the provider: work . Those involved with this call:  . Provider: Carnella Guadalajara, DNP . CMA: Abby Wingate, CMA . Front Desk/Registration: Jeralene Peters  . Time spent on call: 12 minutes on the phone discussing health concerns. 30 minutes total spent in review of patient's record and preparation of their chart.  I verified patient identity using two factors (patient name and date of birth). Patient consents verbally to being seen via telemedicine visit today.

## 2020-04-08 NOTE — Patient Instructions (Signed)

## 2020-04-09 NOTE — Telephone Encounter (Signed)
Multiple calls placed to patient with no answer and no return call.   Message to be closed.  

## 2020-04-12 DIAGNOSIS — G245 Blepharospasm: Secondary | ICD-10-CM | POA: Diagnosis not present

## 2020-04-19 ENCOUNTER — Other Ambulatory Visit: Payer: Self-pay | Admitting: Family Medicine

## 2020-04-29 ENCOUNTER — Other Ambulatory Visit: Payer: Medicare Other

## 2020-05-19 ENCOUNTER — Encounter: Payer: Self-pay | Admitting: *Deleted

## 2020-06-07 ENCOUNTER — Telehealth: Payer: Self-pay

## 2020-06-07 NOTE — Telephone Encounter (Signed)
error 

## 2020-06-18 ENCOUNTER — Ambulatory Visit: Payer: Medicare Other | Admitting: Family Medicine

## 2020-06-21 DIAGNOSIS — R300 Dysuria: Secondary | ICD-10-CM | POA: Diagnosis not present

## 2020-06-21 DIAGNOSIS — N390 Urinary tract infection, site not specified: Secondary | ICD-10-CM | POA: Diagnosis not present

## 2020-06-22 ENCOUNTER — Other Ambulatory Visit: Payer: Self-pay

## 2020-06-22 ENCOUNTER — Ambulatory Visit (INDEPENDENT_AMBULATORY_CARE_PROVIDER_SITE_OTHER): Payer: Medicare Other | Admitting: *Deleted

## 2020-06-22 DIAGNOSIS — M810 Age-related osteoporosis without current pathological fracture: Secondary | ICD-10-CM

## 2020-07-05 DIAGNOSIS — G245 Blepharospasm: Secondary | ICD-10-CM | POA: Diagnosis not present

## 2020-07-07 IMAGING — MG DIGITAL SCREENING BILATERAL MAMMOGRAM WITH TOMO AND CAD
8 series · 9 of 24 positions shown · non-contrast
Comparison: Previous exam(s).

CLINICAL DATA: Screening.

EXAM:
DIGITAL SCREENING BILATERAL MAMMOGRAM WITH TOMO AND CAD

[L CC synth-2D]
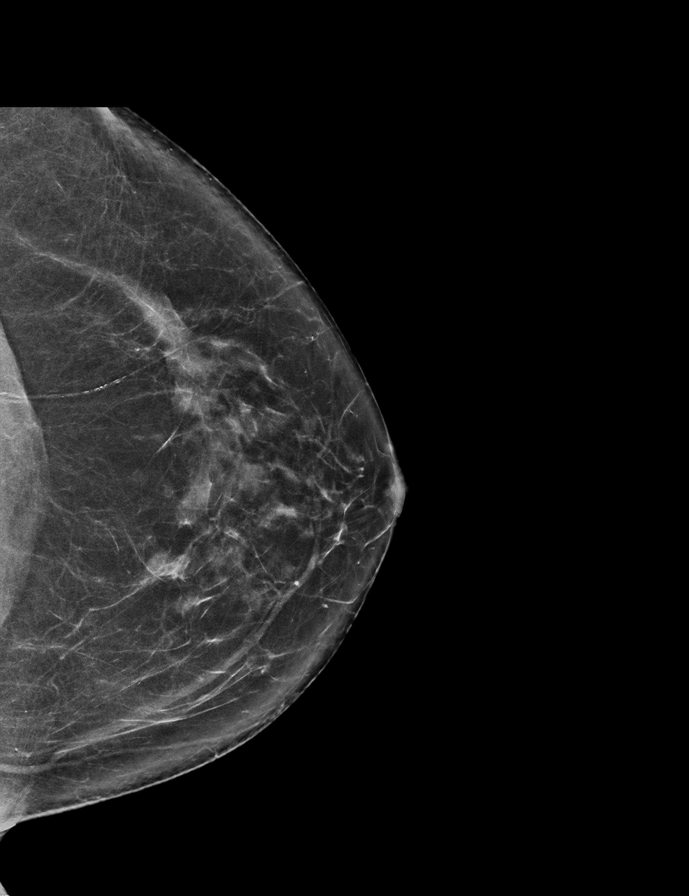

[R MLO synth-2D]
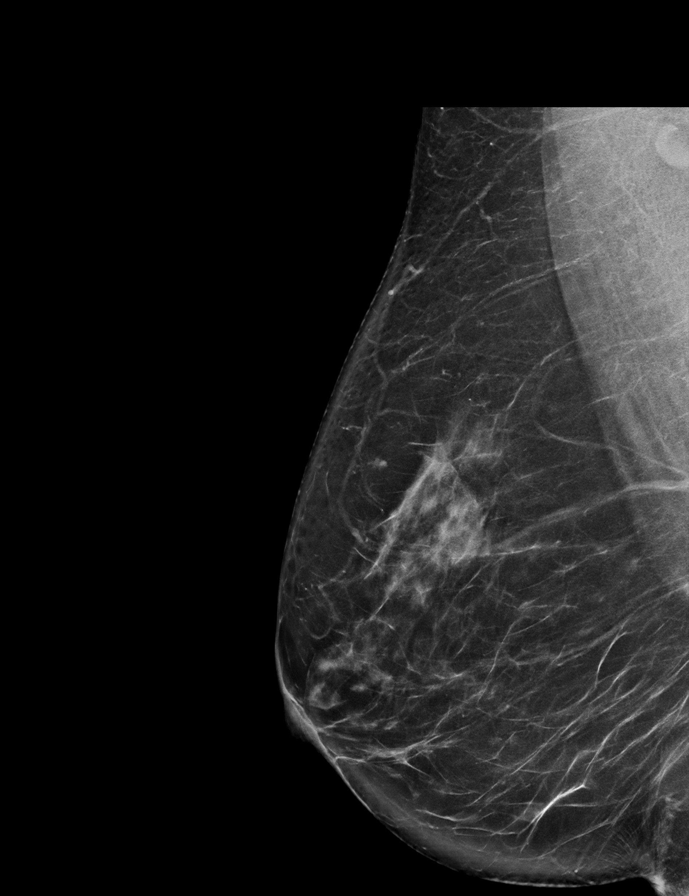

[L MLO synth-2D]
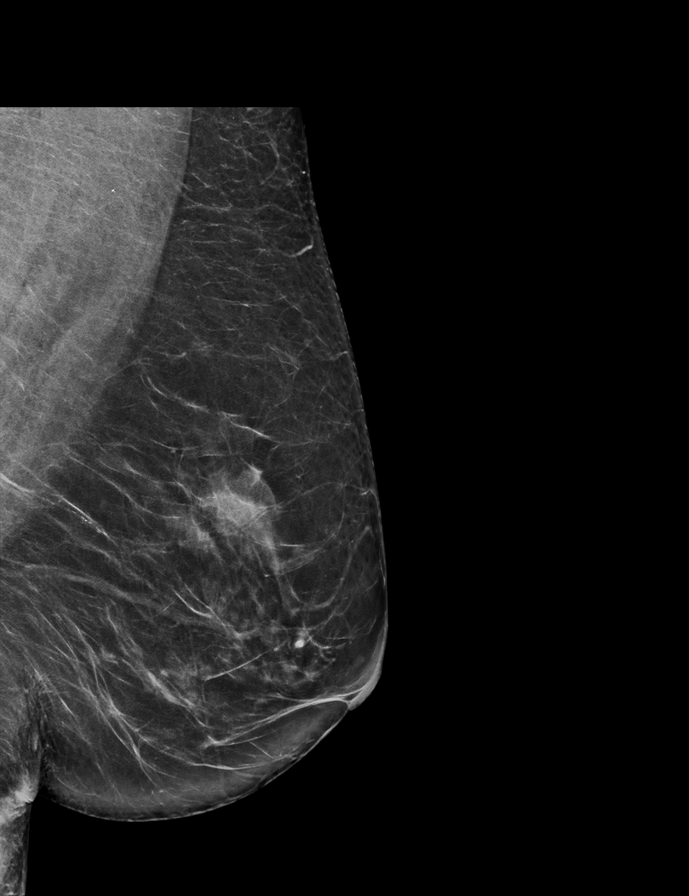

[R CC synth-2D]
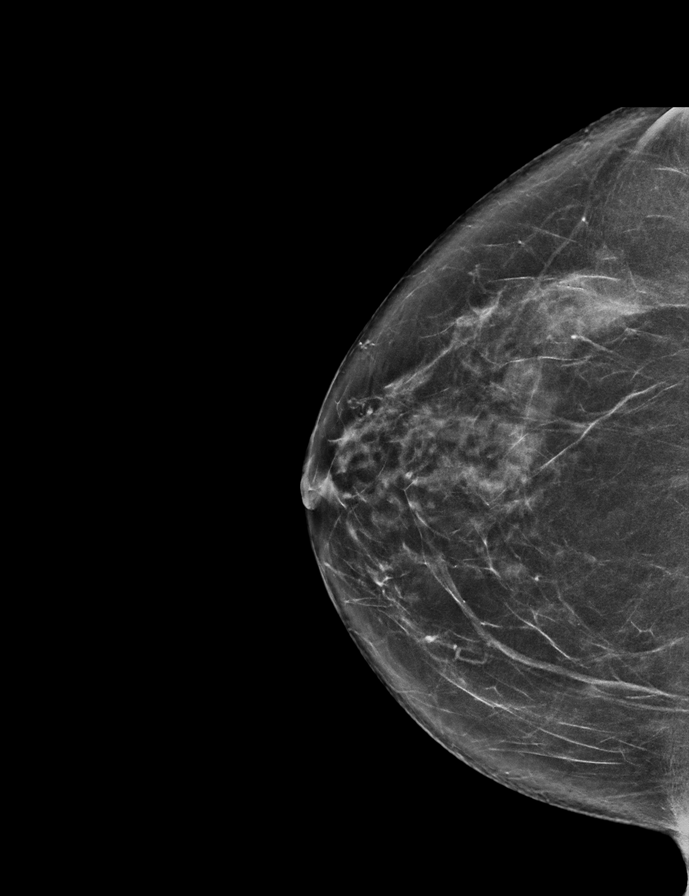

[L MLO tomo · 2 of 69 frames shown]
[frame 23/69]
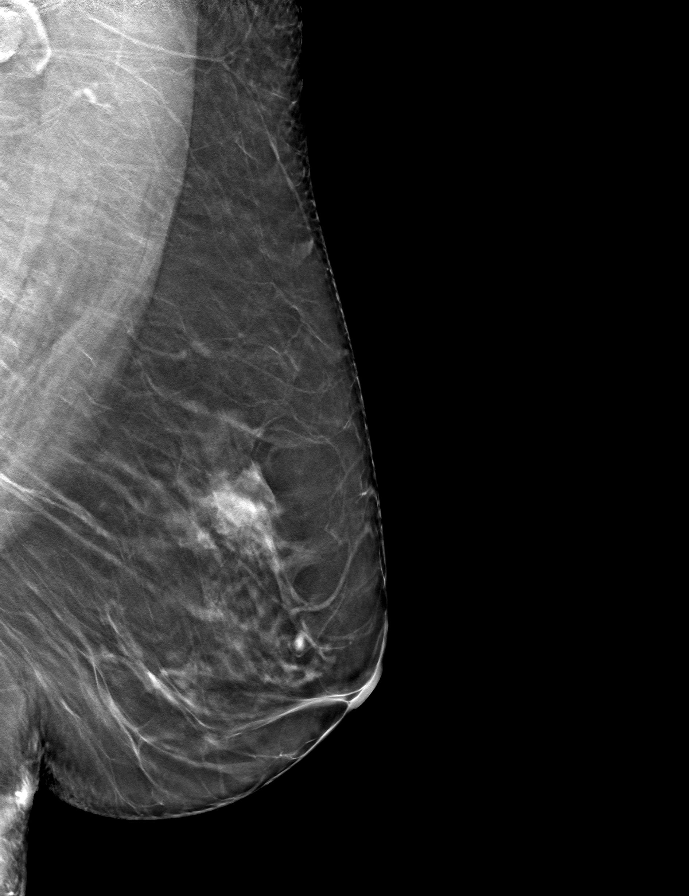
[frame 35/69]
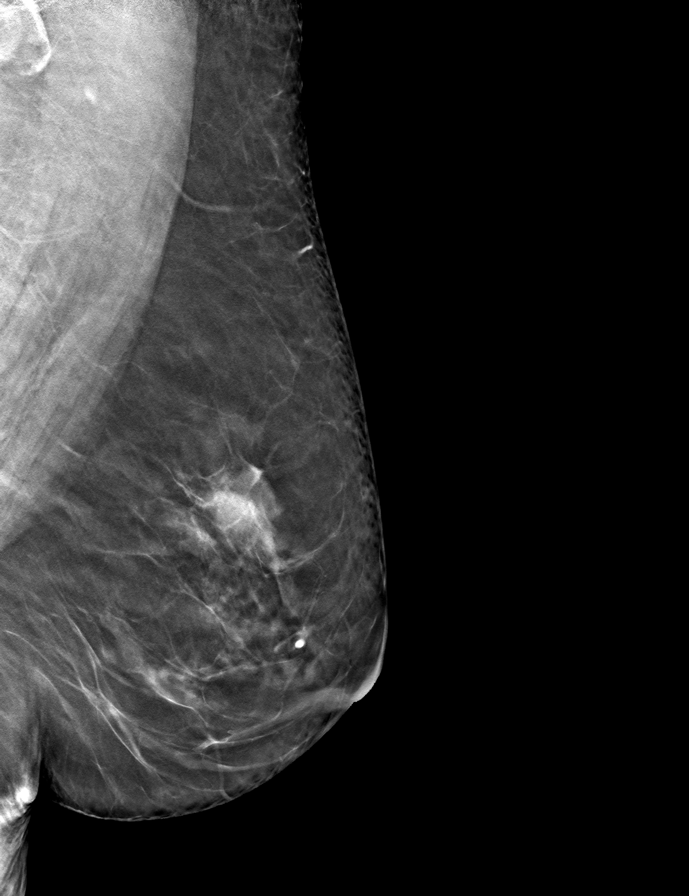

[R CC tomo · tomo slice 33/66.0]
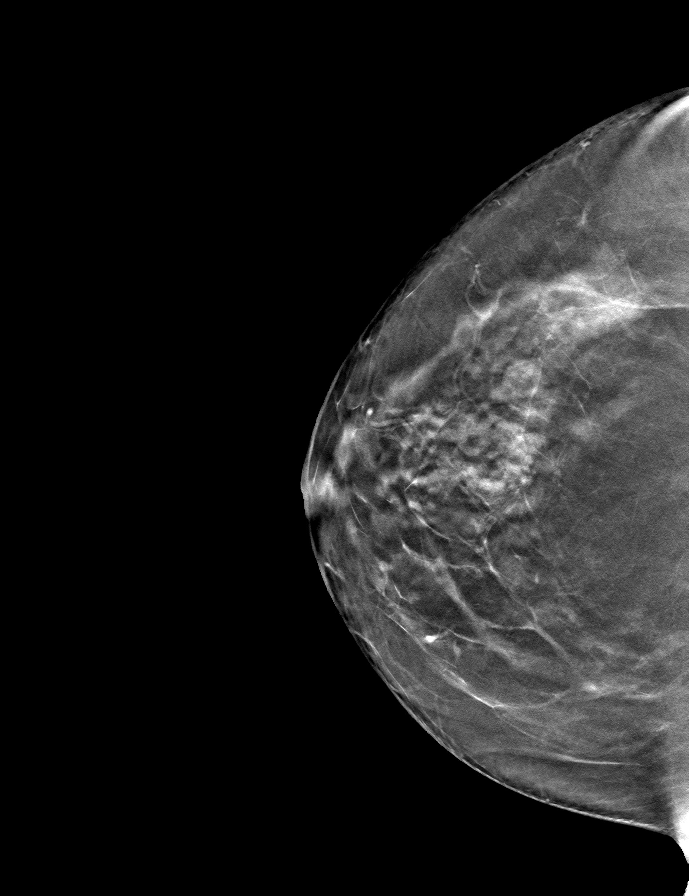

[L CC tomo · tomo slice 33/65.0]
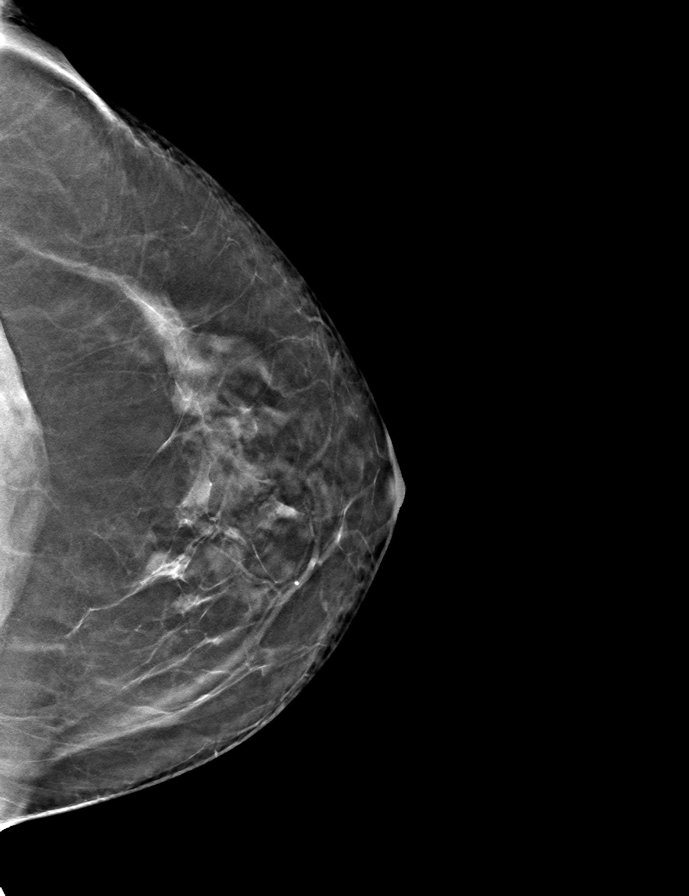

[R MLO tomo · tomo slice 36/71.0]
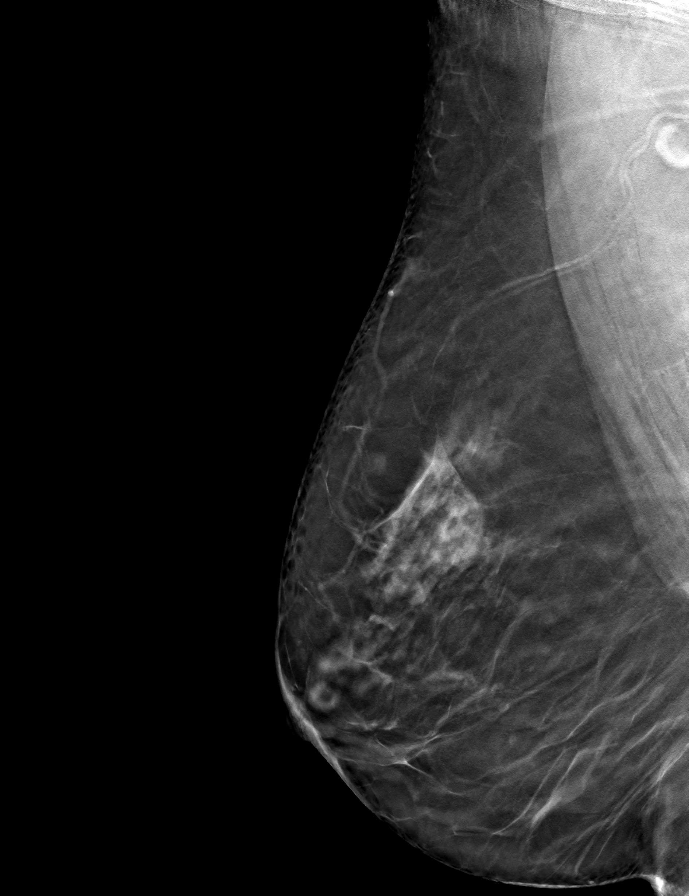

[9 of 24 positions shown; findings below may reference images not displayed]

ACR Breast Density Category b: There are scattered areas of
fibroglandular density.
FINDINGS: There are no findings suspicious for malignancy. Images were
processed with CAD.
IMPRESSION: No mammographic evidence of malignancy. A result letter of this
screening mammogram will be mailed directly to the patient.

RECOMMENDATION:
Screening mammogram in one year. (Code:CN-U-775)

BI-RADS CATEGORY  1: Negative.

## 2020-08-30 DIAGNOSIS — B358 Other dermatophytoses: Secondary | ICD-10-CM | POA: Diagnosis not present

## 2020-09-24 ENCOUNTER — Other Ambulatory Visit: Payer: Self-pay

## 2020-09-24 ENCOUNTER — Ambulatory Visit
Admission: RE | Admit: 2020-09-24 | Discharge: 2020-09-24 | Disposition: A | Discharge status: Home / Self Care | Payer: Medicare Other | Source: Ambulatory Visit | Attending: Family Medicine | Admitting: Family Medicine

## 2020-09-24 ENCOUNTER — Other Ambulatory Visit: Payer: Self-pay | Admitting: Family Medicine

## 2020-09-24 DIAGNOSIS — M810 Age-related osteoporosis without current pathological fracture: Secondary | ICD-10-CM | POA: Diagnosis not present

## 2020-09-24 DIAGNOSIS — Z780 Asymptomatic menopausal state: Secondary | ICD-10-CM | POA: Diagnosis not present

## 2020-09-24 DIAGNOSIS — Z0000 Encounter for general adult medical examination without abnormal findings: Secondary | ICD-10-CM

## 2020-10-04 DIAGNOSIS — G245 Blepharospasm: Secondary | ICD-10-CM | POA: Diagnosis not present

## 2020-10-05 DIAGNOSIS — L814 Other melanin hyperpigmentation: Secondary | ICD-10-CM | POA: Diagnosis not present

## 2020-10-05 DIAGNOSIS — B358 Other dermatophytoses: Secondary | ICD-10-CM | POA: Diagnosis not present

## 2020-10-11 DIAGNOSIS — J019 Acute sinusitis, unspecified: Secondary | ICD-10-CM | POA: Diagnosis not present

## 2020-10-11 DIAGNOSIS — R42 Dizziness and giddiness: Secondary | ICD-10-CM | POA: Diagnosis not present

## 2020-10-22 DIAGNOSIS — H8110 Benign paroxysmal vertigo, unspecified ear: Secondary | ICD-10-CM | POA: Diagnosis not present

## 2020-10-22 DIAGNOSIS — J310 Chronic rhinitis: Secondary | ICD-10-CM | POA: Diagnosis not present

## 2020-10-23 ENCOUNTER — Other Ambulatory Visit: Payer: Self-pay | Admitting: Family Medicine

## 2020-10-23 DIAGNOSIS — E7800 Pure hypercholesterolemia, unspecified: Secondary | ICD-10-CM

## 2020-11-01 DIAGNOSIS — J309 Allergic rhinitis, unspecified: Secondary | ICD-10-CM | POA: Diagnosis not present

## 2020-11-01 DIAGNOSIS — H903 Sensorineural hearing loss, bilateral: Secondary | ICD-10-CM | POA: Diagnosis not present

## 2020-11-01 DIAGNOSIS — H8109 Meniere's disease, unspecified ear: Secondary | ICD-10-CM | POA: Diagnosis not present

## 2020-11-01 DIAGNOSIS — R42 Dizziness and giddiness: Secondary | ICD-10-CM | POA: Diagnosis not present

## 2020-11-04 ENCOUNTER — Encounter: Payer: Self-pay | Admitting: Family Medicine

## 2020-11-09 ENCOUNTER — Ambulatory Visit (INDEPENDENT_AMBULATORY_CARE_PROVIDER_SITE_OTHER): Payer: Medicare Other | Admitting: Family Medicine

## 2020-11-09 ENCOUNTER — Other Ambulatory Visit: Payer: Self-pay

## 2020-11-09 VITALS — BP 130/78 | HR 87 | Temp 97.3°F | Ht 64.0 in | Wt 155.0 lb

## 2020-11-09 DIAGNOSIS — H8110 Benign paroxysmal vertigo, unspecified ear: Secondary | ICD-10-CM | POA: Diagnosis not present

## 2020-11-09 DIAGNOSIS — H6981 Other specified disorders of Eustachian tube, right ear: Secondary | ICD-10-CM

## 2020-11-09 MED ORDER — FLUTICASONE PROPIONATE 50 MCG/ACT NA SUSP: 2.0000 | Freq: Every day | NASAL | 6 refills | Status: DC

## 2020-11-09 NOTE — Progress Notes (Signed)
Subjective:    Patient ID: Michelle Foster, female    DOB: 07-31-1950, 70 y.o.   MRN: [id number]  HPI Patient [place].  She developed a pressure-like sensation in her ear that would not subside and pain in her ear.  She also developed severe vertigo that was associated with movement and head turning.  She went to the emergency room where she was diagnosed with an ear infection and was given steroids and antibiotics.  She went to see an ENT doctor who stated that the infection had resolved.  There was no apparent infection when she saw the ENT physician however he was perplexed as to the vertigo that she was experiencing.  It sounds like he was treating her empirically for Neer's disease that she was started on Maxide however the vertigo did not improve with the Maxide.  She states the vertigo comes and goes.  Is brought on by turning her head or changing positions.  It sounds like BPPV.  She denies any hearing loss or tinnitus.  She states that hearing evaluation at the ENT doctor proved normal and he decided that she did not have Mnire's disease.  She states that she has not had vertigo since July.  However she continues to have a pressure-like sensation in her right ear.  She states that she feels like there is fluid in her right ear.  On examination today there is no cerumen impaction.  There is no evidence of any infection.  The tympanic membrane is bulging outward slightly indicating middle ear pressure.  However I do not appreciate any effusion Past Medical History:  Diagnosis Date   Allergy    Arthritis    Chronic kidney disease    kidney stones in 1985, tumor on uretha,    Colon polyps    Estrogen deficiency    Eustachian tube dysfunction    Heart murmur    Hyperlipidemia    no meds   Osteoporosis    Past Surgical History:  Procedure Laterality Date   ABDOMINAL HYSTERECTOMY     TAH BSO by accident at 18    COLONOSCOPY  2012   done in [place],  physician has since retired   COLONOSCOPY WITH PROPOFOL N/A 11/13/2018   Procedure: COLONOSCOPY WITH PROPOFOL;  Surgeon: Irving Copas., MD;  Location: [facility];  Service: Gastroenterology;  Laterality: N/A;  NEEDS TO BE A 90 MIN SPOT   meniscal tear     right   OOPHORECTOMY     OVARY SURGERY     1982   POLYPECTOMY  11/13/2018   Procedure: POLYPECTOMY;  Surgeon: Irving Copas., MD;  Location: [facility];  Service: Gastroenterology;;   Current Outpatient Medications on File Prior to Visit  Medication Sig Dispense Refill   acetaminophen (TYLENOL) 325 MG tablet Take 650 mg by mouth every 6 (six) hours as needed.     clotrimazole-betamethasone (LOTRISONE) cream Apply 1 application topically 2 (two) times daily. 30 g 0   diazepam (VALIUM) 2 MG tablet Take 2 mg by mouth every 6 (six) hours as needed for anxiety.     ketoconazole (NIZORAL) 2 % shampoo APPLY TOPICALLY TWICE A WEEK 120 mL 1   levocetirizine (XYZAL) 5 MG tablet Take 5 mg by mouth every evening.     triamterene-hydrochlorothiazide (DYAZIDE) 37.5-25 MG capsule Take 1 capsule by mouth daily.     fluticasone (FLONASE) 50 MCG/ACT nasal spray SPRAY 2 SPRAYS INTO EACH NOSTRIL EVERY  DAY (Patient not taking: Reported on 11/09/2020) 16 mL 6   rosuvastatin (CRESTOR) 10 MG tablet TAKE 1 TABLET BY MOUTH EVERY DAY (Patient not taking: Reported on 11/09/2020) 90 tablet 3   sulfamethoxazole-trimethoprim (BACTRIM DS) 800-160 MG tablet Take 1 tablet by mouth 2 (two) times daily. (Patient not taking: Reported on 11/09/2020) 10 tablet 0   terbinafine (LAMISIL) 250 MG tablet Take 1 tablet (250 mg total) by mouth daily. (Patient not taking: Reported on 11/09/2020) 14 tablet 0   Current Facility-Administered Medications on File Prior to Visit  Medication Dose Route Frequency Provider Last Rate Last Admin   denosumab (PROLIA) injection 60 mg  60 mg Subcutaneous Q6 months Susy Frizzle, MD   60 mg at 06/22/20 1130   Allergies   Allergen Reactions   Codeine Nausea And Vomiting   Social History   Socioeconomic History   Marital status: Married    Spouse name: Not on file   Number of children: Not on file   Years of education: Not on file   Highest education level: Not on file  Occupational History   Not on file  Tobacco Use   Smoking status: Never   Smokeless tobacco: Never  Vaping Use   Vaping Use: Never used  Substance and Sexual Activity   Alcohol use: No   Drug use: No   Sexual activity: Not on file  Other Topics Concern   Not on file  Social History Narrative   Not on file   Social Determinants of Health   Financial Resource Strain: Not on file  Food Insecurity: Not on file  Transportation Needs: Not on file  Physical Activity: Not on file  Stress: Not on file  Social Connections: Not on file  Intimate Partner Violence: Not on file   Family History  Problem Relation Age of Onset   Breast cancer Mother 49   Heart disease Mother    Heart failure Father    Heart disease Father    Atrial fibrillation Father    Stroke Father 27   Heart failure Brother    Brain cancer Brother    Stomach cancer Neg Hx    Colon cancer Neg Hx    Pancreatic cancer Neg Hx    Esophageal cancer Neg Hx       Review of Systems  All other systems reviewed and are negative.     Objective:   Physical Exam Vitals reviewed.  Constitutional:      General: She is not in acute distress.    Appearance: She is well-developed. She is not diaphoretic.  HENT:     Head: Normocephalic and atraumatic.     Right Ear: Ear canal and external ear normal. No drainage. There is no impacted cerumen. Tympanic membrane is bulging.     Left Ear: Tympanic membrane, ear canal and external ear normal. No drainage. There is no impacted cerumen.     Nose: Nose normal.     Mouth/Throat:     Pharynx: No oropharyngeal exudate.  Eyes:     General: No scleral icterus.       Right eye: No discharge.        Left eye: No  discharge.     Conjunctiva/sclera: Conjunctivae normal.     Pupils: Pupils are equal, round, and reactive to light.  Neck:     Thyroid: No thyromegaly.     Vascular: No JVD.  Cardiovascular:     Rate and Rhythm: Normal rate and regular rhythm.  Heart sounds: Normal heart sounds. No murmur heard.   No friction rub. No gallop.  Pulmonary:     Effort: Pulmonary effort is normal. No respiratory distress.     Breath sounds: Normal breath sounds. No stridor. No wheezing or rales.  Chest:     Chest wall: No tenderness.  Abdominal:     General: Bowel sounds are normal. There is no distension.     Palpations: Abdomen is soft. There is no mass.     Tenderness: There is no abdominal tenderness. There is no guarding or rebound.  Musculoskeletal:     Cervical back: Normal range of motion and neck supple.  Lymphadenopathy:     Cervical: No cervical adenopathy.  Neurological:     Mental Status: She is alert and oriented to person, place, and time.     Cranial Nerves: No cranial nerve deficit.     Motor: No abnormal muscle tone.     Coordination: Coordination normal.     Deep Tendon Reflexes: Reflexes are normal and symmetric.  Psychiatric:        Behavior: Behavior normal.        Thought Content: Thought content normal.        Judgment: Judgment normal.          Assessment & Plan:  Benign paroxysmal positional vertigo, unspecified laterality  Dysfunction of right eustachian tube I believe the patient is having vertigo due to BPPV.  The vertigo has resolved as the patient started doing Epley maneuvers at home on her own.  I do not feel that the vertigo requires any additional work-up.  She denies any hearing loss or tinnitus so I do not feel that this is Mnire's disease.  An acoustic neuroma would be highly unlikely given her symptoms.  I do believe that she has eustachian tube dysfunction.  We will try combination of Xyzal and Flonase and if not improving, will consult ENT to  consider tympanostomy tube placement if they concur with my assessment

## 2020-11-15 ENCOUNTER — Telehealth: Payer: Self-pay

## 2020-11-15 MED ORDER — NIRMATRELVIR/RITONAVIR (PAXLOVID) TABLET (RENAL DOSING): 2.0000 | ORAL_TABLET | Freq: Two times a day (BID) | ORAL | 0 refills | Status: AC

## 2020-11-15 NOTE — Telephone Encounter (Signed)
Patient tested positive for COVID on 11/15/2020..   Sx began 11/13/2020 include HA, fever, and body aches.  Advised to continue symptom management with OTC medications: Tylenol/ Ibuprofen for fever/ body aches, Mucinex/ Delsym for cough/ chest congestion, Afrin/Sudafed/nasal saline for sinus pressure/ nasal congestion.  GFR 57. Order for Paxlovid sent to pharmacy. Advised possible SE include nausea, diarrhea, loss of taste and hypertension.  If chest pain, shortness of breath, fever >104 that is unresponsive to antipyretics noted, or if unable to tolerate fluids, advised to go to ER for evaluation.   Advised that the CDC recommends the following criteria prior to ending isolation in vaccinated and unvaccinated persons: at least 5 days since symptoms onset, then an additional 5 days of masking  AND 3 days fever free without antipyretics (Tylenol or Ibuprofen) AND improvement in respiratory symptoms.

## 2020-11-15 NOTE — Telephone Encounter (Signed)
Pt's spouse called in stating that this pt has tested positive for Covid, and would like to see if they could get some meds sent to the pharmacy. Pt's spouse stated that pt has a fever along with other symptoms.Please advise.   Cb#: [phone]

## 2020-11-17 ENCOUNTER — Ambulatory Visit: Payer: Medicare Other

## 2020-11-23 DIAGNOSIS — M549 Dorsalgia, unspecified: Secondary | ICD-10-CM | POA: Diagnosis not present

## 2020-11-23 DIAGNOSIS — R300 Dysuria: Secondary | ICD-10-CM | POA: Diagnosis not present

## 2020-11-23 DIAGNOSIS — R109 Unspecified abdominal pain: Secondary | ICD-10-CM | POA: Diagnosis not present

## 2020-12-01 ENCOUNTER — Encounter: Payer: Self-pay | Admitting: *Deleted

## 2020-12-07 DIAGNOSIS — L298 Other pruritus: Secondary | ICD-10-CM | POA: Diagnosis not present

## 2020-12-07 DIAGNOSIS — R1313 Dysphagia, pharyngeal phase: Secondary | ICD-10-CM | POA: Diagnosis not present

## 2020-12-07 DIAGNOSIS — H8109 Meniere's disease, unspecified ear: Secondary | ICD-10-CM | POA: Diagnosis not present

## 2020-12-07 DIAGNOSIS — J309 Allergic rhinitis, unspecified: Secondary | ICD-10-CM | POA: Diagnosis not present

## 2020-12-07 DIAGNOSIS — Z9109 Other allergy status, other than to drugs and biological substances: Secondary | ICD-10-CM | POA: Insufficient documentation

## 2020-12-07 DIAGNOSIS — K219 Gastro-esophageal reflux disease without esophagitis: Secondary | ICD-10-CM | POA: Diagnosis not present

## 2020-12-09 ENCOUNTER — Telehealth: Payer: Self-pay | Admitting: Family Medicine

## 2020-12-09 DIAGNOSIS — R1313 Dysphagia, pharyngeal phase: Secondary | ICD-10-CM | POA: Diagnosis not present

## 2020-12-09 NOTE — Telephone Encounter (Signed)
Left message for patient to call back and schedule Medicare Annual Wellness Visit (AWV) in office.   If not able to come in office, please offer to do virtually or by telephone.  Left office number and my jabber [phone].  Last AWV:10/27/2019  Please schedule at anytime with Nurse Health Advisor.

## 2020-12-16 ENCOUNTER — Telehealth: Payer: Self-pay

## 2020-12-16 NOTE — Telephone Encounter (Signed)
Pt called in stating that she has taken an at home UTI test and it came back positive for a UTI. Pt was wanting to know if she could get some meds for this sent to her pharmacy on file. Please advise  Cb#: [phone]

## 2020-12-17 ENCOUNTER — Other Ambulatory Visit: Payer: Self-pay | Admitting: Family Medicine

## 2020-12-17 MED ORDER — SULFAMETHOXAZOLE-TRIMETHOPRIM 800-160 MG PO TABS: 1.0000 | ORAL_TABLET | Freq: Two times a day (BID) | ORAL | 0 refills | Status: DC

## 2020-12-17 NOTE — Telephone Encounter (Signed)
Patient has been made aware of new rx.

## 2020-12-23 ENCOUNTER — Telehealth: Payer: Self-pay | Admitting: *Deleted

## 2020-12-23 ENCOUNTER — Ambulatory Visit
Admission: RE | Admit: 2020-12-23 | Discharge: 2020-12-23 | Disposition: A | Discharge status: Home / Self Care | Payer: Medicare Other | Source: Ambulatory Visit | Attending: Family Medicine | Admitting: Family Medicine

## 2020-12-23 ENCOUNTER — Other Ambulatory Visit: Payer: Self-pay

## 2020-12-23 ENCOUNTER — Other Ambulatory Visit: Payer: Medicare Other

## 2020-12-23 DIAGNOSIS — R300 Dysuria: Secondary | ICD-10-CM | POA: Diagnosis not present

## 2020-12-23 DIAGNOSIS — Z0000 Encounter for general adult medical examination without abnormal findings: Secondary | ICD-10-CM

## 2020-12-23 DIAGNOSIS — Z1231 Encounter for screening mammogram for malignant neoplasm of breast: Secondary | ICD-10-CM | POA: Diagnosis not present

## 2020-12-23 NOTE — Telephone Encounter (Signed)
Received call from patient.   Reports that she completed Bactrim for UTI, but continues to have Sx.   Advised to stop by office to leave urine sample to repeat labs.

## 2020-12-24 LAB — URINE CULTURE
MICRO NUMBER:: 12470270
SPECIMEN QUALITY:: ADEQUATE

## 2020-12-24 LAB — URINALYSIS, ROUTINE W REFLEX MICROSCOPIC
Bacteria, UA: NONE SEEN /HPF
Bilirubin Urine: NEGATIVE
Glucose, UA: NEGATIVE
Hyaline Cast: NONE SEEN /LPF
Ketones, ur: NEGATIVE
Leukocytes,Ua: NEGATIVE
Nitrite: NEGATIVE
Protein, ur: NEGATIVE
Specific Gravity, Urine: 1.02 (ref 1.001–1.035)
WBC, UA: NONE SEEN /HPF (ref 0–5)
pH: 6 (ref 5.0–8.0)

## 2020-12-24 LAB — MICROSCOPIC MESSAGE

## 2020-12-28 ENCOUNTER — Other Ambulatory Visit: Payer: Self-pay | Admitting: Family Medicine

## 2020-12-28 DIAGNOSIS — R928 Other abnormal and inconclusive findings on diagnostic imaging of breast: Secondary | ICD-10-CM

## 2021-01-03 ENCOUNTER — Other Ambulatory Visit: Payer: Self-pay

## 2021-01-03 ENCOUNTER — Ambulatory Visit (INDEPENDENT_AMBULATORY_CARE_PROVIDER_SITE_OTHER): Payer: Medicare Other | Admitting: *Deleted

## 2021-01-03 DIAGNOSIS — Z23 Encounter for immunization: Secondary | ICD-10-CM | POA: Diagnosis not present

## 2021-01-03 DIAGNOSIS — M810 Age-related osteoporosis without current pathological fracture: Secondary | ICD-10-CM | POA: Diagnosis not present

## 2021-01-10 ENCOUNTER — Other Ambulatory Visit: Payer: Self-pay | Admitting: Family Medicine

## 2021-01-10 ENCOUNTER — Ambulatory Visit
Admission: RE | Admit: 2021-01-10 | Discharge: 2021-01-10 | Disposition: A | Discharge status: Home / Self Care | Payer: Medicare Other | Source: Ambulatory Visit | Attending: Family Medicine | Admitting: Family Medicine

## 2021-01-10 ENCOUNTER — Other Ambulatory Visit: Payer: Self-pay

## 2021-01-10 DIAGNOSIS — R928 Other abnormal and inconclusive findings on diagnostic imaging of breast: Secondary | ICD-10-CM

## 2021-01-10 DIAGNOSIS — N6489 Other specified disorders of breast: Secondary | ICD-10-CM

## 2021-01-10 DIAGNOSIS — R922 Inconclusive mammogram: Secondary | ICD-10-CM | POA: Diagnosis not present

## 2021-01-10 DIAGNOSIS — Z803 Family history of malignant neoplasm of breast: Secondary | ICD-10-CM | POA: Diagnosis not present

## 2021-01-11 DIAGNOSIS — H25812 Combined forms of age-related cataract, left eye: Secondary | ICD-10-CM | POA: Diagnosis not present

## 2021-01-11 DIAGNOSIS — H18513 Endothelial corneal dystrophy, bilateral: Secondary | ICD-10-CM | POA: Diagnosis not present

## 2021-01-11 DIAGNOSIS — H25811 Combined forms of age-related cataract, right eye: Secondary | ICD-10-CM | POA: Diagnosis not present

## 2021-01-11 DIAGNOSIS — H25813 Combined forms of age-related cataract, bilateral: Secondary | ICD-10-CM | POA: Diagnosis not present

## 2021-01-12 DIAGNOSIS — G245 Blepharospasm: Secondary | ICD-10-CM | POA: Diagnosis not present

## 2021-01-18 DIAGNOSIS — B358 Other dermatophytoses: Secondary | ICD-10-CM | POA: Diagnosis not present

## 2021-01-18 DIAGNOSIS — L570 Actinic keratosis: Secondary | ICD-10-CM | POA: Diagnosis not present

## 2021-01-18 DIAGNOSIS — H61032 Chondritis of left external ear: Secondary | ICD-10-CM | POA: Diagnosis not present

## 2021-01-18 HISTORY — PX: BREAST BIOPSY: SHX20

## 2021-01-20 ENCOUNTER — Ambulatory Visit (INDEPENDENT_AMBULATORY_CARE_PROVIDER_SITE_OTHER): Payer: Medicare Other | Admitting: Family Medicine

## 2021-01-20 ENCOUNTER — Encounter: Payer: Self-pay | Admitting: Family Medicine

## 2021-01-20 ENCOUNTER — Other Ambulatory Visit: Payer: Self-pay

## 2021-01-20 VITALS — BP 124/62 | HR 84 | Temp 98.6°F | Resp 16 | Ht 64.0 in | Wt 151.0 lb

## 2021-01-20 DIAGNOSIS — Z0000 Encounter for general adult medical examination without abnormal findings: Secondary | ICD-10-CM

## 2021-01-20 DIAGNOSIS — Z0001 Encounter for general adult medical examination with abnormal findings: Secondary | ICD-10-CM | POA: Diagnosis not present

## 2021-01-20 DIAGNOSIS — E7800 Pure hypercholesterolemia, unspecified: Secondary | ICD-10-CM | POA: Diagnosis not present

## 2021-01-20 MED ORDER — NITROFURANTOIN MONOHYD MACRO 100 MG PO CAPS: 100.0000 mg | ORAL_CAPSULE | Freq: Every day | ORAL | 0 refills | Status: DC | PRN

## 2021-01-20 NOTE — Progress Notes (Signed)
Subjective:    Patient ID: Michelle Foster, female    DOB: 1950/12/05, 70 y.o.   MRN: [id number]  HPI Patient is a very pleasant 70 year old Caucasian female who is here today for a complete physical exam.  She has 1 concern.  She is getting frequent UTIs associated with sexual intercourse.  She states that she only notices the UTIs after sexual intercourse.  They do not seem to occur randomly.  Otherwise she is doing well.  Unfortunately she recently found a mass on her mammogram and she is scheduled for a biopsy tomorrow.  Her colonoscopy was performed in 2020 and was very tortuous.  Due to her history of precancerous polyps they recommended a repeat colonoscopy in 5 years.  Based on her age she does not need a Pap smear.  Her bone density test was performed earlier this year and showed improvement in her T score.  She will be due for that again in 2 years.  She is due for a flu shot as well as a COVID shot but she refuses that today.  Past Medical History:  Diagnosis Date   Allergy    Arthritis    Chronic kidney disease    kidney stones in 1985, tumor on uretha,    Colon polyps    Estrogen deficiency    Eustachian tube dysfunction    Heart murmur    Hyperlipidemia    no meds   Osteoporosis    Past Surgical History:  Procedure Laterality Date   ABDOMINAL HYSTERECTOMY     TAH BSO by accident at 70    COLONOSCOPY  2012   done in [place], physician has since retired   COLONOSCOPY WITH PROPOFOL N/A 11/13/2018   Procedure: COLONOSCOPY WITH PROPOFOL;  Surgeon: Irving Copas., MD;  Location: [facility];  Service: Gastroenterology;  Laterality: N/A;  NEEDS TO BE A 90 MIN SPOT   meniscal tear     right   OOPHORECTOMY     OVARY SURGERY     1982   POLYPECTOMY  11/13/2018   Procedure: POLYPECTOMY;  Surgeon: Irving Copas., MD;  Location: [facility];  Service: Gastroenterology;;   Current Outpatient Medications on File Prior to Visit  Medication Sig Dispense Refill    acetaminophen (TYLENOL) 325 MG tablet Take 650 mg by mouth every 6 (six) hours as needed.     clotrimazole-betamethasone (LOTRISONE) cream Apply 1 application topically 2 (two) times daily. 30 g 0   fluticasone (FLONASE) 50 MCG/ACT nasal spray Place 2 sprays into both nostrils daily. 16 g 6   ketoconazole (NIZORAL) 2 % shampoo APPLY TOPICALLY TWICE A WEEK 120 mL 1   levocetirizine (XYZAL) 5 MG tablet Take 5 mg by mouth every evening.     Current Facility-Administered Medications on File Prior to Visit  Medication Dose Route Frequency Provider Last Rate Last Admin   denosumab (PROLIA) injection 60 mg  60 mg Subcutaneous Q6 months Susy Frizzle, MD   60 mg at 01/03/21 1537   Allergies  Allergen Reactions   Codeine Nausea And Vomiting   Social History   Socioeconomic History   Marital status: Married    Spouse name: Not on file   Number of children: Not on file   Years of education: Not on file   Highest education level: Not on file  Occupational History   Not on file  Tobacco Use   Smoking status: Never   Smokeless tobacco: Never  Vaping Use   Vaping Use:  Never used  Substance and Sexual Activity   Alcohol use: No   Drug use: No   Sexual activity: Not on file  Other Topics Concern   Not on file  Social History Narrative   Not on file   Social Determinants of Health   Financial Resource Strain: Not on file  Food Insecurity: Not on file  Transportation Needs: Not on file  Physical Activity: Not on file  Stress: Not on file  Social Connections: Not on file  Intimate Partner Violence: Not on file   Family History  Problem Relation Age of Onset   Breast cancer Mother 61   Heart disease Mother    Heart failure Father    Heart disease Father    Atrial fibrillation Father    Stroke Father 45   Heart failure Brother    Brain cancer Brother    Stomach cancer Neg Hx    Colon cancer Neg Hx    Pancreatic cancer Neg Hx    Esophageal cancer Neg Hx       Review of  Systems  All other systems reviewed and are negative.     Objective:   Physical Exam Vitals reviewed.  Constitutional:      General: She is not in acute distress.    Appearance: She is well-developed. She is not diaphoretic.  HENT:     Head: Normocephalic and atraumatic.     Right Ear: External ear normal.     Left Ear: External ear normal.     Nose: Nose normal.     Mouth/Throat:     Pharynx: No oropharyngeal exudate.  Eyes:     General: No scleral icterus.       Right eye: No discharge.        Left eye: No discharge.     Conjunctiva/sclera: Conjunctivae normal.     Pupils: Pupils are equal, round, and reactive to light.  Neck:     Thyroid: No thyromegaly.     Vascular: No JVD.  Cardiovascular:     Rate and Rhythm: Normal rate and regular rhythm.     Heart sounds: Normal heart sounds. No murmur heard.   No friction rub. No gallop.  Pulmonary:     Effort: Pulmonary effort is normal. No respiratory distress.     Breath sounds: Normal breath sounds. No stridor. No wheezing or rales.  Chest:     Chest wall: No tenderness.  Abdominal:     General: Bowel sounds are normal. There is no distension.     Palpations: Abdomen is soft. There is no mass.     Tenderness: There is no abdominal tenderness. There is no guarding or rebound.  Musculoskeletal:     Cervical back: Normal range of motion and neck supple.  Lymphadenopathy:     Cervical: No cervical adenopathy.  Neurological:     Mental Status: She is alert and oriented to person, place, and time.     Cranial Nerves: No cranial nerve deficit.     Motor: No abnormal muscle tone.     Coordination: Coordination normal.     Deep Tendon Reflexes: Reflexes are normal and symmetric. Reflexes normal.  Psychiatric:        Behavior: Behavior normal.        Thought Content: Thought content normal.        Judgment: Judgment normal.          Assessment & Plan:  Pure hypercholesterolemia - Plan: CBC with Differential/Platelet,  COMPLETE METABOLIC  PANEL WITH GFR, Lipid panel  General medical exam  Physical exam today is normal.  I wished her the best of luck on her biopsy tomorrow.  Colonoscopy is up-to-date.  Bone density test is up-to-date.  Pap smear is not required.  I did encourage her to get a flu shot, COVID-vaccine,.  The remainder of her immunizations are up-to-date.  I will check a CBC CMP and a lipid panel.  We will try Macrobid 100 mg tablets 1 tablet after intercourse.  Hopefully 30 tablets will last her a year and reduce the frequency of her urinary tract infections

## 2021-01-21 ENCOUNTER — Other Ambulatory Visit: Payer: Self-pay | Admitting: Family Medicine

## 2021-01-21 ENCOUNTER — Other Ambulatory Visit: Payer: Self-pay | Admitting: *Deleted

## 2021-01-21 ENCOUNTER — Ambulatory Visit
Admission: RE | Admit: 2021-01-21 | Discharge: 2021-01-21 | Disposition: A | Discharge status: Home / Self Care | Payer: Medicare Other | Source: Ambulatory Visit | Attending: Family Medicine | Admitting: Family Medicine

## 2021-01-21 DIAGNOSIS — N6489 Other specified disorders of breast: Secondary | ICD-10-CM

## 2021-01-21 DIAGNOSIS — N6314 Unspecified lump in the right breast, lower inner quadrant: Secondary | ICD-10-CM | POA: Diagnosis not present

## 2021-01-21 DIAGNOSIS — D241 Benign neoplasm of right breast: Secondary | ICD-10-CM | POA: Diagnosis not present

## 2021-01-21 LAB — CBC WITH DIFFERENTIAL/PLATELET
Absolute Monocytes: 540 cells/uL (ref 200–950)
Basophils Absolute: 99 cells/uL (ref 0–200)
Basophils Relative: 1.3 %
Eosinophils Absolute: 258 cells/uL (ref 15–500)
Eosinophils Relative: 3.4 %
HCT: 39.6 % (ref 35.0–45.0)
Hemoglobin: 12.9 g/dL (ref 11.7–15.5)
Lymphs Abs: 2827 cells/uL (ref 850–3900)
MCH: 31.2 pg (ref 27.0–33.0)
MCHC: 32.6 g/dL (ref 32.0–36.0)
MCV: 95.7 fL (ref 80.0–100.0)
MPV: 10.6 fL (ref 7.5–12.5)
Monocytes Relative: 7.1 %
Neutro Abs: 3876 cells/uL (ref 1500–7800)
Neutrophils Relative %: 51 %
Platelets: 347 10*3/uL (ref 140–400)
RBC: 4.14 10*6/uL (ref 3.80–5.10)
RDW: 11.8 % (ref 11.0–15.0)
Total Lymphocyte: 37.2 %
WBC: 7.6 10*3/uL (ref 3.8–10.8)

## 2021-01-21 LAB — COMPLETE METABOLIC PANEL WITH GFR
AG Ratio: 1.3 (calc) (ref 1.0–2.5)
ALT: 9 U/L (ref 6–29)
AST: 14 U/L (ref 10–35)
Albumin: 4.4 g/dL (ref 3.6–5.1)
Alkaline phosphatase (APISO): 69 U/L (ref 37–153)
BUN/Creatinine Ratio: 28 (calc) — ABNORMAL HIGH (ref 6–22)
BUN: 29 mg/dL — ABNORMAL HIGH (ref 7–25)
CO2: 22 mmol/L (ref 20–32)
Calcium: 9.9 mg/dL (ref 8.6–10.4)
Chloride: 107 mmol/L (ref 98–110)
Creat: 1.02 mg/dL — ABNORMAL HIGH (ref 0.60–1.00)
Globulin: 3.3 g/dL (calc) (ref 1.9–3.7)
Glucose, Bld: 107 mg/dL — ABNORMAL HIGH (ref 65–99)
Potassium: 4.6 mmol/L (ref 3.5–5.3)
Sodium: 138 mmol/L (ref 135–146)
Total Bilirubin: 0.3 mg/dL (ref 0.2–1.2)
Total Protein: 7.7 g/dL (ref 6.1–8.1)
eGFR: 59 mL/min/{1.73_m2} — ABNORMAL LOW (ref >=60)

## 2021-01-21 LAB — LIPID PANEL
Cholesterol: 286 mg/dL — ABNORMAL HIGH (ref <200)
HDL: 39 mg/dL — ABNORMAL LOW (ref >=50)
LDL Cholesterol (Calc): 209 mg/dL (calc) — ABNORMAL HIGH
Non-HDL Cholesterol (Calc): 247 mg/dL (calc) — ABNORMAL HIGH (ref <130)
Total CHOL/HDL Ratio: 7.3 (calc) — ABNORMAL HIGH (ref <5.0)
Triglycerides: 204 mg/dL — ABNORMAL HIGH (ref <150)

## 2021-01-21 MED ORDER — ROSUVASTATIN CALCIUM 20 MG PO TABS: 20.0000 mg | ORAL_TABLET | Freq: Every day | ORAL | 3 refills | Status: DC

## 2021-04-13 DIAGNOSIS — G245 Blepharospasm: Secondary | ICD-10-CM | POA: Diagnosis not present

## 2021-04-20 DIAGNOSIS — M1990 Unspecified osteoarthritis, unspecified site: Secondary | ICD-10-CM | POA: Diagnosis not present

## 2021-04-20 DIAGNOSIS — Z79899 Other long term (current) drug therapy: Secondary | ICD-10-CM | POA: Diagnosis not present

## 2021-04-20 DIAGNOSIS — E7800 Pure hypercholesterolemia, unspecified: Secondary | ICD-10-CM | POA: Diagnosis not present

## 2021-04-20 DIAGNOSIS — H25811 Combined forms of age-related cataract, right eye: Secondary | ICD-10-CM | POA: Diagnosis not present

## 2021-05-04 DIAGNOSIS — E7800 Pure hypercholesterolemia, unspecified: Secondary | ICD-10-CM | POA: Diagnosis not present

## 2021-05-04 DIAGNOSIS — Z79899 Other long term (current) drug therapy: Secondary | ICD-10-CM | POA: Diagnosis not present

## 2021-05-04 DIAGNOSIS — H25812 Combined forms of age-related cataract, left eye: Secondary | ICD-10-CM | POA: Diagnosis not present

## 2021-05-04 DIAGNOSIS — M1990 Unspecified osteoarthritis, unspecified site: Secondary | ICD-10-CM | POA: Diagnosis not present

## 2021-05-14 ENCOUNTER — Other Ambulatory Visit: Payer: Self-pay | Admitting: Family Medicine

## 2021-05-16 ENCOUNTER — Other Ambulatory Visit: Payer: Self-pay

## 2021-05-30 ENCOUNTER — Other Ambulatory Visit: Payer: Medicare PPO

## 2021-05-30 ENCOUNTER — Other Ambulatory Visit: Payer: Self-pay

## 2021-05-30 DIAGNOSIS — E782 Mixed hyperlipidemia: Secondary | ICD-10-CM | POA: Diagnosis not present

## 2021-05-30 DIAGNOSIS — E7800 Pure hypercholesterolemia, unspecified: Secondary | ICD-10-CM

## 2021-05-30 DIAGNOSIS — Z0000 Encounter for general adult medical examination without abnormal findings: Secondary | ICD-10-CM

## 2021-05-30 LAB — CBC WITH DIFFERENTIAL/PLATELET
Absolute Monocytes: 637 cells/uL (ref 200–950)
Basophils Absolute: 114 cells/uL (ref 0–200)
Basophils Relative: 1.2 %
Eosinophils Absolute: 342 cells/uL (ref 15–500)
Eosinophils Relative: 3.6 %
HCT: 40.3 % (ref 35.0–45.0)
Hemoglobin: 13.1 g/dL (ref 11.7–15.5)
Lymphs Abs: 2945 cells/uL (ref 850–3900)
MCH: 30.9 pg (ref 27.0–33.0)
MCHC: 32.5 g/dL (ref 32.0–36.0)
MCV: 95 fL (ref 80.0–100.0)
MPV: 11.1 fL (ref 7.5–12.5)
Monocytes Relative: 6.7 %
Neutro Abs: 5463 cells/uL (ref 1500–7800)
Neutrophils Relative %: 57.5 %
Platelets: 330 10*3/uL (ref 140–400)
RBC: 4.24 10*6/uL (ref 3.80–5.10)
RDW: 12.1 % (ref 11.0–15.0)
Total Lymphocyte: 31 %
WBC: 9.5 10*3/uL (ref 3.8–10.8)

## 2021-05-30 LAB — COMPREHENSIVE METABOLIC PANEL
AG Ratio: 1.3 (calc) (ref 1.0–2.5)
ALT: 9 U/L (ref 6–29)
AST: 13 U/L (ref 10–35)
Albumin: 4.3 g/dL (ref 3.6–5.1)
Alkaline phosphatase (APISO): 45 U/L (ref 37–153)
BUN/Creatinine Ratio: 21 (calc) (ref 6–22)
BUN: 23 mg/dL (ref 7–25)
CO2: 28 mmol/L (ref 20–32)
Calcium: 10.8 mg/dL — ABNORMAL HIGH (ref 8.6–10.4)
Chloride: 107 mmol/L (ref 98–110)
Creat: 1.08 mg/dL — ABNORMAL HIGH (ref 0.60–1.00)
Globulin: 3.2 g/dL (calc) (ref 1.9–3.7)
Glucose, Bld: 102 mg/dL — ABNORMAL HIGH (ref 65–99)
Potassium: 4.7 mmol/L (ref 3.5–5.3)
Sodium: 141 mmol/L (ref 135–146)
Total Bilirubin: 0.4 mg/dL (ref 0.2–1.2)
Total Protein: 7.5 g/dL (ref 6.1–8.1)

## 2021-05-30 LAB — LIPID PANEL
Cholesterol: 147 mg/dL (ref <200)
HDL: 36 mg/dL — ABNORMAL LOW (ref >=50)
LDL Cholesterol (Calc): 84 mg/dL (calc)
Non-HDL Cholesterol (Calc): 111 mg/dL (calc) (ref <130)
Total CHOL/HDL Ratio: 4.1 (calc) (ref <5.0)
Triglycerides: 176 mg/dL — ABNORMAL HIGH (ref <150)

## 2021-06-03 ENCOUNTER — Ambulatory Visit (INDEPENDENT_AMBULATORY_CARE_PROVIDER_SITE_OTHER): Payer: Medicare PPO | Admitting: Family Medicine

## 2021-06-03 ENCOUNTER — Other Ambulatory Visit: Payer: Self-pay

## 2021-06-03 ENCOUNTER — Encounter: Payer: Self-pay | Admitting: Family Medicine

## 2021-06-03 VITALS — BP 118/82 | HR 66 | Temp 97.2°F | Resp 18 | Ht 64.0 in | Wt 145.0 lb

## 2021-06-03 DIAGNOSIS — M810 Age-related osteoporosis without current pathological fracture: Secondary | ICD-10-CM | POA: Diagnosis not present

## 2021-06-03 DIAGNOSIS — Z0001 Encounter for general adult medical examination with abnormal findings: Secondary | ICD-10-CM

## 2021-06-03 DIAGNOSIS — E7800 Pure hypercholesterolemia, unspecified: Secondary | ICD-10-CM

## 2021-06-03 DIAGNOSIS — Z0000 Encounter for general adult medical examination without abnormal findings: Secondary | ICD-10-CM

## 2021-06-03 NOTE — Progress Notes (Signed)
? ?Subjective:  ? ? Patient ID: Michelle Foster, female    DOB: 02-09-1951, 71 y.o.   MRN: [id number] ? ?HPI ?Patient is a very pleasant 71 year old Caucasian female here today for complete physical exam.  When I last saw her in the fall, her cholesterol was extremely high.  Since that time she has drastically switched her diet and started taking her statin and as result her cholesterol has plummeted.  It is outstanding today as shown by the lab work below.  Her mammogram is up-to-date.  Her last bone density test was performed in 2022.  It showed improvement in her osteoporosis.  She is due to recheck that in 2025.  Her next colonoscopy is due in 2025 having had 1 in 2020 that showed a polyp.  Her Pap smear is not required.  She is due for a tetanus shot as well as a COVID booster.  She politely declines both of these at the present time. ?Lab on 05/30/2021  ?Component Date Value Ref Range Status  ? Cholesterol 05/30/2021 147  <200 mg/dL Final  ? HDL 05/30/2021 36 (L)  > OR = 50 mg/dL Final  ? Triglycerides 05/30/2021 176 (H)  <150 mg/dL Final  ? LDL Cholesterol (Calc) 05/30/2021 84  mg/dL (calc) Final  ? Comment: Reference range: <100 ?Marland Kitchen ?Desirable range <100 mg/dL for primary prevention;   ?<70 mg/dL for patients with CHD or diabetic patients  ?with > or = 2 CHD risk factors. ?. ?LDL-C is now calculated using the Martin-Hopkins  ?calculation, which is a validated novel method providing  ?better accuracy than the Friedewald equation in the  ?estimation of LDL-C.  ?Cresenciano Genre et al. Annamaria Helling. 1638;466(59): 2061-2068  ?(http://education.[facility].com/faq/FAQ164) ?  ? Total CHOL/HDL Ratio 05/30/2021 4.1  <5.0 (calc) Final  ? Non-HDL Cholesterol (Calc) 05/30/2021 111  <130 mg/dL (calc) Final  ? Comment: For patients with diabetes plus 1 major ASCVD risk  ?factor, treating to a non-HDL-C goal of <100 mg/dL  ?(LDL-C of <70 mg/dL) is considered a therapeutic  ?option. ?  ? Glucose, Bld 05/30/2021 102 (H)  65 - 99 mg/dL  Final  ? Comment: . ?           Fasting reference interval ?. ?For someone without known diabetes, a glucose value ?between 100 and 125 mg/dL is consistent with ?prediabetes and should be confirmed with a ?follow-up test. ?. ?  ? BUN 05/30/2021 23  7 - 25 mg/dL Final  ? Creat 05/30/2021 1.08 (H)  0.60 - 1.00 mg/dL Final  ? BUN/Creatinine Ratio 05/30/2021 21  6 - 22 (calc) Final  ? Sodium 05/30/2021 141  135 - 146 mmol/L Final  ? Potassium 05/30/2021 4.7  3.5 - 5.3 mmol/L Final  ? Chloride 05/30/2021 107  98 - 110 mmol/L Final  ? CO2 05/30/2021 28  20 - 32 mmol/L Final  ? Calcium 05/30/2021 10.8 (H)  8.6 - 10.4 mg/dL Final  ? Total Protein 05/30/2021 7.5  6.1 - 8.1 g/dL Final  ? Albumin 05/30/2021 4.3  3.6 - 5.1 g/dL Final  ? Globulin 05/30/2021 3.2  1.9 - 3.7 g/dL (calc) Final  ? AG Ratio 05/30/2021 1.3  1.0 - 2.5 (calc) Final  ? Total Bilirubin 05/30/2021 0.4  0.2 - 1.2 mg/dL Final  ? Alkaline phosphatase (APISO) 05/30/2021 45  37 - 153 U/L Final  ? AST 05/30/2021 13  10 - 35 U/L Final  ? ALT 05/30/2021 9  6 - 29 U/L Final  ? WBC 05/30/2021  9.5  3.8 - 10.8 Thousand/uL Final  ? RBC 05/30/2021 4.24  3.80 - 5.10 Million/uL Final  ? Hemoglobin 05/30/2021 13.1  11.7 - 15.5 g/dL Final  ? HCT 05/30/2021 40.3  35.0 - 45.0 % Final  ? MCV 05/30/2021 95.0  80.0 - 100.0 fL Final  ? MCH 05/30/2021 30.9  27.0 - 33.0 pg Final  ? MCHC 05/30/2021 32.5  32.0 - 36.0 g/dL Final  ? RDW 05/30/2021 12.1  11.0 - 15.0 % Final  ? Platelets 05/30/2021 330  140 - 400 Thousand/uL Final  ? MPV 05/30/2021 11.1  7.5 - 12.5 fL Final  ? Neutro Abs 05/30/2021 5,463  1,500 - 7,800 cells/uL Final  ? Lymphs Abs 05/30/2021 2,945  850 - 3,900 cells/uL Final  ? Absolute Monocytes 05/30/2021 637  200 - 950 cells/uL Final  ? Eosinophils Absolute 05/30/2021 342  15 - 500 cells/uL Final  ? Basophils Absolute 05/30/2021 114  0 - 200 cells/uL Final  ? Neutrophils Relative % 05/30/2021 57.5  % Final  ? Total Lymphocyte 05/30/2021 31.0  % Final  ? Monocytes  Relative 05/30/2021 6.7  % Final  ? Eosinophils Relative 05/30/2021 3.6  % Final  ? Basophils Relative 05/30/2021 1.2  % Final  ? ? ?Past Medical History:  ?Diagnosis Date  ? Allergy   ? Arthritis   ? Chronic kidney disease   ? 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 Colon polyps   ? Estrogen deficiency   ? Eustachian tube dysfunction   ? Heart murmur   ? Hyperlipidemia   ? no meds  ? Osteoporosis   ? ?Past Surgical History:  ?Procedure Laterality Date  ? ABDOMINAL HYSTERECTOMY    ? TAH BSO by accident at 53   ? COLONOSCOPY  2012  ? done in [place], physician has since retired  ? COLONOSCOPY WITH PROPOFOL N/A 11/13/2018  ? Procedure: COLONOSCOPY WITH PROPOFOL;  Surgeon: Mansouraty, Telford Nab., MD;  Location: [facility];  Service: Gastroenterology;  Laterality: N/A;  NEEDS TO BE A 90 MIN SPOT  ? meniscal tear    ? right  ? OOPHORECTOMY    ? OVARY SURGERY    ? 1982  ? POLYPECTOMY  11/13/2018  ? Procedure: POLYPECTOMY;  Surgeon: Mansouraty, Telford Nab., MD;  Location: [facility];  Service: Gastroenterology;;  ? ?Current Outpatient Medications on File Prior to Visit  ?Medication Sig Dispense Refill  ? acetaminophen (TYLENOL) 325 MG tablet Take 650 mg by mouth every 6 (six) hours as needed.    ? clotrimazole-betamethasone (LOTRISONE) cream Apply 1 application topically 2 (two) times daily. 30 g 0  ? fluticasone (FLONASE) 50 MCG/ACT nasal spray Place 2 sprays into both nostrils daily. 16 g 6  ? ketoconazole (NIZORAL) 2 % shampoo APPLY TOPICALLY TWICE A WEEK 120 mL 1  ? levocetirizine (XYZAL) 5 MG tablet Take 5 mg by mouth every evening.    ? rosuvastatin (CRESTOR) 20 MG tablet Take 1 tablet (20 mg total) by mouth daily. 90 tablet 3  ? ?Current Facility-Administered Medications on File Prior to Visit  ?Medication Dose Route Frequency Provider Last Rate Last Admin  ? denosumab (PROLIA) injection 60 mg  60 mg Subcutaneous Q6 months Susy Frizzle, MD   60 mg at 01/03/21 1537  ? ?Allergies  ?Allergen Reactions  ?  Codeine Nausea And Vomiting  ? ?Social History  ? ?Socioeconomic History  ? Marital status: Married  ?  Spouse name: Not on file  ? Number of  children: Not on file  ? Years of education: Not on file  ? Highest education level: Not on file  ?Occupational History  ? Not on file  ?Tobacco Use  ? Smoking status: Never  ? Smokeless tobacco: Never  ?Vaping Use  ? Vaping Use: Never used  ?Substance and Sexual Activity  ? Alcohol use: No  ? Drug use: No  ? Sexual activity: Not on file  ?Other Topics Concern  ? Not on file  ?Social History Narrative  ? Not on file  ? ?Social Determinants of Health  ? ?Financial Resource Strain: Not on file  ?Food Insecurity: Not on file  ?Transportation Needs: Not on file  ?Physical Activity: Not on file  ?Stress: Not on file  ?Social Connections: Not on file  ?Intimate Partner Violence: Not on file  ? ?Family History  ?Problem Relation Age of Onset  ? Breast cancer Mother 66  ? Heart disease Mother   ? Heart failure Father   ? Heart disease Father   ? Atrial fibrillation Father   ? Stroke Father 73  ? Heart failure Brother   ? Brain cancer Brother   ? Stomach cancer Neg Hx   ? Colon cancer Neg Hx   ? Pancreatic cancer Neg Hx   ? Esophageal cancer Neg Hx   ? ? ? ? ?Review of Systems  ?All other systems reviewed and are negative. ? ?   ?Objective:  ? Physical Exam ?Vitals reviewed.  ?Constitutional:   ?   General: She is not in acute distress. ?   Appearance: She is well-developed. She is not diaphoretic.  ?HENT:  ?   Head: Normocephalic and atraumatic.  ?   Right Ear: External ear normal.  ?   Left Ear: External ear normal.  ?   Nose: Nose normal.  ?   Mouth/Throat:  ?   Pharynx: No oropharyngeal exudate.  ?Eyes:  ?   General: No scleral icterus.    ?   Right eye: No discharge.     ?   Left eye: No discharge.  ?   Conjunctiva/sclera: Conjunctivae normal.  ?   Pupils: Pupils are equal, round, and reactive to light.  ?Neck:  ?   Thyroid: No thyromegaly.  ?   Vascular: No JVD.  ?Cardiovascular:   ?   Rate and Rhythm: Normal rate and regular rhythm.  ?   Heart sounds: Normal heart sounds. No murmur heard. ?  No friction rub. No gallop.  ?Pulmonary:  ?   Effort: Pulmonary effort is normal. No respirat

## 2021-06-07 DIAGNOSIS — Z9841 Cataract extraction status, right eye: Secondary | ICD-10-CM | POA: Diagnosis not present

## 2021-06-07 DIAGNOSIS — Z961 Presence of intraocular lens: Secondary | ICD-10-CM | POA: Diagnosis not present

## 2021-06-07 DIAGNOSIS — Z9842 Cataract extraction status, left eye: Secondary | ICD-10-CM | POA: Diagnosis not present

## 2021-07-13 DIAGNOSIS — G245 Blepharospasm: Secondary | ICD-10-CM | POA: Diagnosis not present

## 2021-08-31 DIAGNOSIS — M48062 Spinal stenosis, lumbar region with neurogenic claudication: Secondary | ICD-10-CM | POA: Diagnosis not present

## 2021-09-01 ENCOUNTER — Other Ambulatory Visit: Payer: Self-pay | Admitting: Family Medicine

## 2021-09-01 NOTE — Telephone Encounter (Signed)
Did you still want pt to continue this med?

## 2021-09-27 DIAGNOSIS — M48062 Spinal stenosis, lumbar region with neurogenic claudication: Secondary | ICD-10-CM | POA: Diagnosis not present

## 2021-09-28 DIAGNOSIS — M1712 Unilateral primary osteoarthritis, left knee: Secondary | ICD-10-CM | POA: Diagnosis not present

## 2021-09-28 DIAGNOSIS — M25562 Pain in left knee: Secondary | ICD-10-CM | POA: Diagnosis not present

## 2021-10-04 DIAGNOSIS — Z6825 Body mass index (BMI) 25.0-25.9, adult: Secondary | ICD-10-CM | POA: Diagnosis not present

## 2021-10-04 DIAGNOSIS — M48062 Spinal stenosis, lumbar region with neurogenic claudication: Secondary | ICD-10-CM | POA: Diagnosis not present

## 2021-10-11 ENCOUNTER — Ambulatory Visit: Payer: Medicare Other | Admitting: Family Medicine

## 2021-10-12 DIAGNOSIS — G245 Blepharospasm: Secondary | ICD-10-CM | POA: Diagnosis not present

## 2021-10-14 ENCOUNTER — Telehealth: Payer: Self-pay

## 2021-10-14 NOTE — Telephone Encounter (Signed)
Pharmacy faxed a refill request for terbinafine (LAMISIL) 250 MG tablet [[id number]]  DISCONTINUED   Order Details Dose: 250 mg Route: Oral Frequency: Daily  Dispense Quantity: 14 tablet Refills: 0        Sig: Take 1 tablet (250 mg total) by mouth daily.  Patient not taking: Reported on 08/06/2018       Start Date: 09/03/17 End Date: 08/06/18  Discontinued by: Susy Frizzle, MD on 08/06/2018 14:25

## 2021-10-14 NOTE — Telephone Encounter (Signed)
It has been discontinue. It not pt med list. Refuse refill

## 2021-10-14 NOTE — Progress Notes (Signed)
Cardiology Office Note   Date:  10/21/2021   ID:  Michelle Foster, DOB [birth year], MRN [id number]  PCP:  Susy Frizzle, MD  Cardiologist:   Michelle Sortino Martinique, MD   Chief Complaint  Patient presents with   Hypertension      History of Present Illness: Michelle Foster is a 71 y.o. female who is seen at the request of Dr Dennard Schaumann for evaluation of labile HTN. She has a history of HLD and family history of CAD. She was seen by Dr Gwenlyn Found in the past- last in June 2020. Reported normal heart cath in 1995 in [place]. Had chest pain in 2019. Myoview and Echo were unremarkable. She also describes chronic swelling in her legs. Venous dopplers were normal in 2020.  She reports 2-3 weeks ago she was shopping and developed a HA. BP was elevated at 171/104. She rested and BP came down. She has been checking her BP with a wrist cuff and it is up and down. No change otherwise in health history. She does eat out a lot. Doesn't add sodium. States she doesn't like to take medication. States in the past her BP was always low.     Past Medical History:  Diagnosis Date   Allergy    Arthritis    Chronic kidney disease    kidney stones in 1985, tumor on uretha,    Colon polyps    Estrogen deficiency    Eustachian tube dysfunction    Hyperlipidemia    no meds   Osteoporosis     Past Surgical History:  Procedure Laterality Date   ABDOMINAL HYSTERECTOMY     TAH BSO by accident at 24    COLONOSCOPY  2012   done in [place], physician has since retired   COLONOSCOPY WITH PROPOFOL N/A 11/13/2018   Procedure: COLONOSCOPY WITH PROPOFOL;  Surgeon: Irving Copas., MD;  Location: [facility];  Service: Gastroenterology;  Laterality: N/A;  NEEDS TO BE A 90 MIN SPOT   meniscal tear     right   OOPHORECTOMY     OVARY SURGERY     1982   POLYPECTOMY  11/13/2018   Procedure: POLYPECTOMY;  Surgeon: Irving Copas., MD;  Location: [facility];  Service: Gastroenterology;;   ureteral tumor  removal       Current Outpatient Medications  Medication Sig Dispense Refill   acetaminophen (TYLENOL) 325 MG tablet Take 650 mg by mouth every 6 (six) hours as needed.     clotrimazole-betamethasone (LOTRISONE) cream Apply 1 application topically 2 (two) times daily. 30 g 0   fluticasone (FLONASE) 50 MCG/ACT nasal spray Place 2 sprays into both nostrils daily. 16 g 6   ketoconazole (NIZORAL) 2 % shampoo APPLY TOPICALLY TWICE A WEEK 120 mL 1   levocetirizine (XYZAL) 5 MG tablet Take 5 mg by mouth every evening.     nitrofurantoin, macrocrystal-monohydrate, (MACROBID) 100 MG capsule TAKE 1 CAPSULE (100 MG TOTAL) BY MOUTH DAILY AS NEEDED (AFTER INTERCOURSE) 30 capsule 0   rosuvastatin (CRESTOR) 20 MG tablet Take 1 tablet (20 mg total) by mouth daily. 90 tablet 3   Current Facility-Administered Medications  Medication Dose Route Frequency Provider Last Rate Last Admin   denosumab (PROLIA) injection 60 mg  60 mg Subcutaneous Q6 months Susy Frizzle, MD   60 mg at 01/03/21 1537    Allergies:   Codeine    Social History:  The patient  reports that she has never smoked. She has never used  smokeless tobacco. She reports that she does not drink alcohol and does not use drugs.   Family History:  The patient's family history includes Alzheimer's disease in her mother; Arrhythmia in her father; Atrial fibrillation in her father; Brain cancer in her brother; Breast cancer (age of onset: 55) in her mother; Heart disease in her father and mother; Heart failure in her brother and father; Stroke (age of onset: 56) in her father.    ROS:  Please see the history of present illness.   Otherwise, review of systems are positive for none.   All other systems are reviewed and negative.    PHYSICAL EXAM: VS:  BP 136/83   Pulse 78   Ht '5\' 3"'$  (1.6 m)   Wt 145 lb 3.2 oz (65.9 kg)   SpO2 98%   BMI 25.72 kg/m  , BMI Body mass index is 25.72 kg/m. By my exam BP 146/86 right, 146/94 left arm GEN: Well  nourished, well developed, in no acute distress HEENT: normal Neck: no JVD, carotid bruits, or masses Cardiac: RRR; no murmurs, rubs, or gallops,no edema  Respiratory:  clear to auscultation bilaterally, normal work of breathing GI: soft, nontender, nondistended, + BS MS: no deformity or atrophy. Legs are full but no pitting edema. No varicosities.  Skin: warm and dry, no rash Neuro:  Strength and sensation are intact Psych: euthymic mood, full affect   EKG:  EKG is ordered today. The ekg ordered today demonstrates NSR rate 78. Normal. I have personally reviewed and interpreted this study.    Recent Labs: 05/30/2021: ALT 9; BUN 23; Creat 1.08; Hemoglobin 13.1; Platelets 330; Potassium 4.7; Sodium 141    Lipid Panel    Component Value Date/Time   CHOL 147 05/30/2021 0816   TRIG 176 (H) 05/30/2021 0816   HDL 36 (L) 05/30/2021 0816   CHOLHDL 4.1 05/30/2021 0816   LDLCALC 84 05/30/2021 0816      Wt Readings from Last 3 Encounters:  10/21/21 145 lb 3.2 oz (65.9 kg)  06/03/21 145 lb (65.8 kg)  01/20/21 151 lb (68.5 kg)      Other studies Reviewed: Additional studies/ records that were reviewed today include:   Myoview 04/03/17: Study Highlights    Nuclear stress EF: 72%. The left ventricular ejection fraction is hyperdynamic (>65%). Blood pressure demonstrated a hypertensive response to exercise. There was no ST segment deviation noted during stress. This is a low risk study.   Normal exercise nuclear stress test with no evidence for prior infarct or ischemia.  Normal exercise capacity. Hypertensive blood pressure response to exertion  Echo 04/02/17: Study Conclusions   - Left ventricle: The cavity size was normal. Wall thickness was    normal. Systolic function was vigorous. The estimated ejection    fraction was in the range of 65% to 70%. Wall motion was normal;    there were no regional wall motion abnormalities. Doppler    parameters are consistent with  abnormal left ventricular    relaxation (grade 1 diastolic dysfunction).   Impressions:   - Normal LV systolic function; mild diastolic dysfunction; trace MR    and TR.    ASSESSMENT AND PLAN:  1.  Labile HTN. Recent onset. Labs in March OK. Exam is negative. Suspect she has essential HTN. At this point she does not want to take medication. Discussed importance of lifestyle modification with DASH/low sodium diet, regular aerobic exercise. I asked her to keep a diary of BP readings once a day when relaxed.  Prefer arm cuff. Will have her return in one month to review readings. If BP remains high will need antihypertensive therapy. 2. HLD good response to statin therapy 3. Leg swelling. Really no pitting on exam. Doubt related to edema. Venous dopplers normal in the past. Suggested compression hose but she is not interested.    Current medicines are reviewed at length with the patient today.  The patient does not have concerns regarding medicines.  The following changes have been made:  no change  Labs/ tests ordered today include:  No orders of the defined types were placed in this encounter.        Disposition:   FU with me or APP in 4 weeks  Signed, Panagiota Perfetti Martinique, MD  10/21/2021 4:21 PM    [facility] [address] [place], [place], [address] Phone [phone], Fax [phone]

## 2021-10-19 ENCOUNTER — Telehealth: Payer: Self-pay

## 2021-10-19 NOTE — Telephone Encounter (Signed)
Pt called stated that she has a fungal infection on her leg/butt,ect. Pt stated you have Rx Terbinafine before?  Please advice

## 2021-10-20 NOTE — Telephone Encounter (Signed)
LM for pt to returncall

## 2021-10-21 ENCOUNTER — Encounter: Payer: Self-pay | Admitting: Cardiology

## 2021-10-21 ENCOUNTER — Ambulatory Visit: Payer: Medicare PPO | Admitting: Cardiology

## 2021-10-21 VITALS — BP 136/83 | HR 78 | Ht 63.0 in | Wt 145.2 lb

## 2021-10-21 DIAGNOSIS — E7800 Pure hypercholesterolemia, unspecified: Secondary | ICD-10-CM

## 2021-10-21 DIAGNOSIS — I10 Essential (primary) hypertension: Secondary | ICD-10-CM

## 2021-10-21 DIAGNOSIS — M7989 Other specified soft tissue disorders: Secondary | ICD-10-CM

## 2021-10-21 NOTE — Telephone Encounter (Signed)
Pt called back, stated that the OTC med does not work. Pt wanted to be seen today, however, there is no available time w/pcp today.   Advice pt is she wanted something urgent, she would have to go to UC.  Pt voiced understanding and will try to do that.

## 2021-10-24 DIAGNOSIS — B358 Other dermatophytoses: Secondary | ICD-10-CM | POA: Diagnosis not present

## 2021-10-24 DIAGNOSIS — L304 Erythema intertrigo: Secondary | ICD-10-CM | POA: Diagnosis not present

## 2021-11-10 ENCOUNTER — Other Ambulatory Visit: Payer: Self-pay | Admitting: Family Medicine

## 2021-11-10 DIAGNOSIS — Z1231 Encounter for screening mammogram for malignant neoplasm of breast: Secondary | ICD-10-CM

## 2021-11-24 ENCOUNTER — Ambulatory Visit: Payer: Medicare PPO | Admitting: Cardiology

## 2021-11-30 DIAGNOSIS — M25562 Pain in left knee: Secondary | ICD-10-CM | POA: Diagnosis not present

## 2021-12-01 DIAGNOSIS — S83242A Other tear of medial meniscus, current injury, left knee, initial encounter: Secondary | ICD-10-CM | POA: Diagnosis not present

## 2021-12-26 ENCOUNTER — Ambulatory Visit
Admission: RE | Admit: 2021-12-26 | Discharge: 2021-12-26 | Disposition: A | Discharge status: Home / Self Care | Payer: Medicare PPO | Source: Ambulatory Visit | Attending: Family Medicine | Admitting: Family Medicine

## 2021-12-26 DIAGNOSIS — Z1231 Encounter for screening mammogram for malignant neoplasm of breast: Secondary | ICD-10-CM

## 2021-12-27 DIAGNOSIS — M65862 Other synovitis and tenosynovitis, left lower leg: Secondary | ICD-10-CM | POA: Diagnosis not present

## 2021-12-27 DIAGNOSIS — S83242A Other tear of medial meniscus, current injury, left knee, initial encounter: Secondary | ICD-10-CM | POA: Diagnosis not present

## 2021-12-27 DIAGNOSIS — M21962 Unspecified acquired deformity of left lower leg: Secondary | ICD-10-CM | POA: Diagnosis not present

## 2021-12-27 DIAGNOSIS — M659 Synovitis and tenosynovitis, unspecified: Secondary | ICD-10-CM | POA: Diagnosis not present

## 2021-12-27 DIAGNOSIS — S83282A Other tear of lateral meniscus, current injury, left knee, initial encounter: Secondary | ICD-10-CM | POA: Diagnosis not present

## 2021-12-27 DIAGNOSIS — M94262 Chondromalacia, left knee: Secondary | ICD-10-CM | POA: Diagnosis not present

## 2021-12-28 ENCOUNTER — Ambulatory Visit: Payer: Medicare PPO

## 2022-01-14 ENCOUNTER — Other Ambulatory Visit: Payer: Self-pay | Admitting: Family Medicine

## 2022-01-16 DIAGNOSIS — M25562 Pain in left knee: Secondary | ICD-10-CM | POA: Diagnosis not present

## 2022-01-16 DIAGNOSIS — S83242D Other tear of medial meniscus, current injury, left knee, subsequent encounter: Secondary | ICD-10-CM | POA: Diagnosis not present

## 2022-01-16 NOTE — Telephone Encounter (Signed)
Requested Prescriptions  Pending Prescriptions Disp Refills  . rosuvastatin (CRESTOR) 20 MG tablet [Pharmacy Med Name: ROSUVASTATIN CALCIUM 20 MG TAB] 90 tablet 1    Sig: TAKE 1 TABLET BY MOUTH EVERY DAY     Cardiovascular:  Antilipid - Statins 2 Failed - 01/14/2022  1:29 AM      Failed - Cr in normal range and within 360 days    Creat  Date Value Ref Range Status  05/30/2021 1.08 (H) 0.60 - 1.00 mg/dL Final         Failed - Valid encounter within last 12 months    Recent Outpatient Visits          7 months ago General medical exam   [place] Susy Frizzle, MD   12 months ago Pure hypercholesterolemia   [place] Dennard Schaumann, Cammie Mcgee, MD   1 year ago Benign paroxysmal positional vertigo, unspecified laterality   [place], Cammie Mcgee, MD   1 year ago [place] Eulogio Bear, NP   1 year ago Tinea corporis   [place], [place] Nunnery, MD             Failed - Lipid Panel in normal range within the last 12 months    Cholesterol  Date Value Ref Range Status  05/30/2021 147 <200 mg/dL Final   LDL Cholesterol (Calc)  Date Value Ref Range Status  05/30/2021 84 mg/dL (calc) Final    Comment:    Reference range: <100 . Desirable range <100 mg/dL for primary prevention;   <70 mg/dL for patients with CHD or diabetic patients  with > or = 2 CHD risk factors. Marland Kitchen LDL-C is now calculated using the Martin-Hopkins  calculation, which is a validated novel method providing  better accuracy than the Friedewald equation in the  estimation of LDL-C.  Cresenciano Genre et al. Annamaria Helling. 2706;237(62): 2061-2068  (http://education.[facility].com/faq/FAQ164)    HDL  Date Value Ref Range Status  05/30/2021 36 (L) > OR = 50 mg/dL Final   Triglycerides  Date Value Ref Range Status  05/30/2021 176 (H) <150 mg/dL Final         Passed - Patient is not pregnant

## 2022-01-17 DIAGNOSIS — S83242D Other tear of medial meniscus, current injury, left knee, subsequent encounter: Secondary | ICD-10-CM | POA: Diagnosis not present

## 2022-01-17 DIAGNOSIS — M25562 Pain in left knee: Secondary | ICD-10-CM | POA: Diagnosis not present

## 2022-01-24 DIAGNOSIS — M25562 Pain in left knee: Secondary | ICD-10-CM | POA: Diagnosis not present

## 2022-01-24 DIAGNOSIS — S83242D Other tear of medial meniscus, current injury, left knee, subsequent encounter: Secondary | ICD-10-CM | POA: Diagnosis not present

## 2022-01-25 DIAGNOSIS — G245 Blepharospasm: Secondary | ICD-10-CM | POA: Diagnosis not present

## 2022-01-27 ENCOUNTER — Ambulatory Visit: Payer: Medicare PPO | Admitting: Cardiology

## 2022-01-30 DIAGNOSIS — S83242D Other tear of medial meniscus, current injury, left knee, subsequent encounter: Secondary | ICD-10-CM | POA: Diagnosis not present

## 2022-01-30 DIAGNOSIS — M25562 Pain in left knee: Secondary | ICD-10-CM | POA: Diagnosis not present

## 2022-02-03 DIAGNOSIS — S83242D Other tear of medial meniscus, current injury, left knee, subsequent encounter: Secondary | ICD-10-CM | POA: Diagnosis not present

## 2022-02-03 DIAGNOSIS — M25562 Pain in left knee: Secondary | ICD-10-CM | POA: Diagnosis not present

## 2022-02-23 ENCOUNTER — Ambulatory Visit (INDEPENDENT_AMBULATORY_CARE_PROVIDER_SITE_OTHER): Payer: Medicare PPO

## 2022-02-23 ENCOUNTER — Ambulatory Visit: Payer: Medicare PPO | Admitting: Orthopaedic Surgery

## 2022-02-23 DIAGNOSIS — M1712 Unilateral primary osteoarthritis, left knee: Secondary | ICD-10-CM

## 2022-02-23 DIAGNOSIS — G8929 Other chronic pain: Secondary | ICD-10-CM

## 2022-02-23 DIAGNOSIS — M25562 Pain in left knee: Secondary | ICD-10-CM | POA: Diagnosis not present

## 2022-02-23 NOTE — Progress Notes (Signed)
The patient is a 70 year old that I am seeing for the first time as a patient but have seen family members before so have met her.  She has been dealing with severe left knee pain for some time now.  She actually had arthroscopic surgery in [place] on her left knee at just the beginning of October.  She said at times he did find significant meniscal tearing but also significant cartilage loss on the medial compartment of her knee.  She still has severe medial joint line tenderness and pain since then and constant swelling.  The pain wakes her up at night and hurts with weightbearing as well.  X-rays of her left knee today show tricompartment arthritis with slight varus malalignment and almost bone-on-bone wear the medial compartment and the patellofemoral joint.  This does correlate with the MRI that she brought with her as well and with what her interoperative scope findings were.   On exam today she does have a mild effusion.  There is varus malalignment of the left knee that is correctable.  There is a painful arc of motion of the left knee but her knee is ligamentously stable.  There is severe medial joint line tenderness.  We talked in length in detail about her knee situation.  Given the severity of her arthritis and the pain she is having combined with very conservative treatment, we are recommending a total knee arthroplasty.  She is not on blood thinning medications and nondiabetic.  She is a healthy 85 and very active.  I did talk to her about knee replacement surgery.  I showed her knee replacement model and went over her x-rays with her.  I described the surgery involved in detail and discussed the risk and benefits of surgery as well as what to expect during intraoperative and postoperative course.  All questions and concerns were answered and addressed.  She does wish to have this scheduled soon and given the severity of her pain and I agree with this as well.

## 2022-03-16 ENCOUNTER — Telehealth: Payer: Self-pay

## 2022-03-16 ENCOUNTER — Ambulatory Visit (INDEPENDENT_AMBULATORY_CARE_PROVIDER_SITE_OTHER): Payer: Medicare PPO

## 2022-03-16 VITALS — Ht 63.0 in | Wt 146.0 lb

## 2022-03-16 DIAGNOSIS — Z0000 Encounter for general adult medical examination without abnormal findings: Secondary | ICD-10-CM | POA: Diagnosis not present

## 2022-03-16 NOTE — Patient Instructions (Signed)
Michelle Foster , Thank you for taking time to come for your Medicare Wellness Visit. I appreciate your ongoing commitment to your health goals. Please review the following plan we discussed and let me know if I can assist you in the future.   These are the goals we discussed:  Goals   None     This is a list of the screening recommended for you and due dates:  Health Maintenance  Topic Date Due   Hepatitis C Screening: USPSTF Recommendation to screen - Ages 15-79 yo.  Never done   DTaP/Tdap/Td vaccine (1 - Tdap) Never done   Zoster (Shingles) Vaccine (1 of 2) Never done   Flu Shot  10/18/2021   COVID-19 Vaccine (3 - 2023-24 season) 11/18/2021   Medicare Annual Wellness Visit  06/04/2022   Mammogram  12/27/2022   Colon Cancer Screening  11/12/2028   Pneumonia Vaccine  Completed   DEXA scan (bone density measurement)  Completed   HPV Vaccine  Aged Out    Advanced directives: Please bring a copy of your health care power of attorney and living will to the office to be added to your chart at your convenience.   Conditions/risks identified: Aim for 30 minutes of exercise or brisk walking, 6-8 glasses of water, and 5 servings of fruits and vegetables each day.   Next appointment: Follow up in one year for your annual wellness visit.   Preventive Care 40 Years and Older, Female  Preventive care refers to lifestyle choices and visits with your health care provider that can promote health and wellness. What does preventive care include? A yearly physical exam. This is also called an annual well check. Dental exams once or twice a year. Routine eye exams. Ask your health care provider how often you should have your eyes checked. Personal lifestyle choices, including: Daily care of your teeth and gums. Regular physical activity. Eating a healthy diet. Avoiding tobacco and drug use. Limiting alcohol use. Practicing safe sex. Taking low doses of aspirin every day. Taking vitamin and  mineral supplements as recommended by your health care provider. What happens during an annual well check? The services and screenings done by your health care provider during your annual well check will depend on your age, overall health, lifestyle risk factors, and family history of disease. Counseling  Your health care provider may ask you questions about your: Alcohol use. Tobacco use. Drug use. Emotional well-being. Home and relationship well-being. Sexual activity. Eating habits. History of falls. Memory and ability to understand (cognition). Work and work Statistician. Screening  You may have the following tests or measurements: Height, weight, and BMI. Blood pressure. Lipid and cholesterol levels. These may be checked every 5 years, or more frequently if you are over 25 years old. Skin check. Lung cancer screening. You may have this screening every year starting at age 80 if you have a 30-pack-year history of smoking and currently smoke or have quit within the past 15 years. Fecal occult blood test (FOBT) of the stool. You may have this test every year starting at age 91. Flexible sigmoidoscopy or colonoscopy. You may have a sigmoidoscopy every 5 years or a colonoscopy every 10 years starting at age 20. Prostate cancer screening. Recommendations will vary depending on your family history and other risks. Hepatitis C blood test. Hepatitis B blood test. Sexually transmitted disease (STD) testing. Diabetes screening. This is done by checking your blood sugar (glucose) after you have not eaten for a while (fasting). You  may have this done every 1-3 years. Abdominal aortic aneurysm (AAA) screening. You may need this if you are a current or former smoker. Osteoporosis. You may be screened starting at age 23 if you are at high risk. Talk with your health care provider about your test results, treatment options, and if necessary, the need for more tests. Vaccines  Your health care  provider may recommend certain vaccines, such as: Influenza vaccine. This is recommended every year. Tetanus, diphtheria, and acellular pertussis (Tdap, Td) vaccine. You may need a Td booster every 10 years. Zoster vaccine. You may need this after age 59. Pneumococcal 13-valent conjugate (PCV13) vaccine. One dose is recommended after age 82. Pneumococcal polysaccharide (PPSV23) vaccine. One dose is recommended after age 55. Talk to your health care provider about which screenings and vaccines you need and how often you need them. This information is not intended to replace advice given to you by your health care provider. Make sure you discuss any questions you have with your health care provider. Document Released: 04/02/2015 Document Revised: 11/24/2015 Document Reviewed: 01/05/2015 Elsevier Interactive Patient Education  2017 [place] Prevention in the Home Falls can cause injuries. They can happen to people of all ages. There are many things you can do to make your home safe and to help prevent falls. What can I do on the outside of my home? Regularly fix the edges of walkways and driveways and fix any cracks. Remove anything that might make you trip as you walk through a door, such as a raised step or threshold. Trim any bushes or trees on the path to your home. Use bright outdoor lighting. Clear any walking paths of anything that might make someone trip, such as rocks or tools. Regularly check to see if handrails are loose or broken. Make sure that both sides of any steps have handrails. Any raised decks and porches should have guardrails on the edges. Have any leaves, snow, or ice cleared regularly. Use sand or salt on walking paths during winter. Clean up any spills in your garage right away. This includes oil or grease spills. What can I do in the bathroom? Use night lights. Install grab bars by the toilet and in the tub and shower. Do not use towel bars as grab  bars. Use non-skid mats or decals in the tub or shower. If you need to sit down in the shower, use a plastic, non-slip stool. Keep the floor dry. Clean up any water that spills on the floor as soon as it happens. Remove soap buildup in the tub or shower regularly. Attach bath mats securely with double-sided non-slip rug tape. Do not have throw rugs and other things on the floor that can make you trip. What can I do in the bedroom? Use night lights. Make sure that you have a light by your bed that is easy to reach. Do not use any sheets or blankets that are too big for your bed. They should not hang down onto the floor. Have a firm chair that has side arms. You can use this for support while you get dressed. Do not have throw rugs and other things on the floor that can make you trip. What can I do in the kitchen? Clean up any spills right away. Avoid walking on wet floors. Keep items that you use a lot in easy-to-reach places. If you need to reach something above you, use a strong step stool that has a grab bar. Keep electrical  cords out of the way. Do not use floor polish or wax that makes floors slippery. If you must use wax, use non-skid floor wax. Do not have throw rugs and other things on the floor that can make you trip. What can I do with my stairs? Do not leave any items on the stairs. Make sure that there are handrails on both sides of the stairs and use them. Fix handrails that are broken or loose. Make sure that handrails are as long as the stairways. Check any carpeting to make sure that it is firmly attached to the stairs. Fix any carpet that is loose or worn. Avoid having throw rugs at the top or bottom of the stairs. If you do have throw rugs, attach them to the floor with carpet tape. Make sure that you have a light switch at the top of the stairs and the bottom of the stairs. If you do not have them, ask someone to add them for you. What else can I do to help prevent  falls? Wear shoes that: Do not have high heels. Have rubber bottoms. Are comfortable and fit you well. Are closed at the toe. Do not wear sandals. If you use a stepladder: Make sure that it is fully opened. Do not climb a closed stepladder. Make sure that both sides of the stepladder are locked into place. Ask someone to hold it for you, if possible. Clearly mark and make sure that you can see: Any grab bars or handrails. First and last steps. Where the edge of each step is. Use tools that help you move around (mobility aids) if they are needed. These include: Canes. Walkers. Scooters. Crutches. Turn on the lights when you go into a dark area. Replace any light bulbs as soon as they burn out. Set up your furniture so you have a clear path. Avoid moving your furniture around. If any of your floors are uneven, fix them. If there are any pets around you, be aware of where they are. Review your medicines with your doctor. Some medicines can make you feel dizzy. This can increase your chance of falling. Ask your doctor what other things that you can do to help prevent falls. This information is not intended to replace advice given to you by your health care provider. Make sure you discuss any questions you have with your health care provider. Document Released: 12/31/2008 Document Revised: 08/12/2015 Document Reviewed: 04/10/2014 Elsevier Interactive Patient Education  2017 [employer].

## 2022-03-16 NOTE — Telephone Encounter (Signed)
Patient seen for AWV and states that she needs to have Prolia injections.  Please advise.

## 2022-03-16 NOTE — Progress Notes (Signed)
Subjective:   Michelle Foster is a 71 y.o. female who presents for Medicare Annual (Subsequent) preventive examination.  I connected with  Vonda Harth Creveling on 03/16/22 by a audio enabled telemedicine application and verified that I am speaking with the correct person using two identifiers.  Patient Location: Home  Provider Location: Office/Clinic  I discussed the limitations of evaluation and management by telemedicine. The patient expressed understanding and agreed to proceed.  Review of Systems     Cardiac Risk Factors include: advanced age (>5mn, >>53women);dyslipidemia;hypertension     Objective:    Today's Vitals   03/16/22 1335  Weight: 146 lb (66.2 kg)  Height: '5\' 3"'$  (1.6 m)   Body mass index is 25.86 kg/m.     03/16/2022    1:38 PM 01/20/2021    9:09 AM 03/23/2017    4:29 PM  Advanced Directives  Does Patient Have a Medical Advance Directive? Yes Yes Yes  Type of Advance Directive Living will;Healthcare Power of [place]  Does patient want to make changes to medical advance directive? No - Patient declined No - Patient declined   Copy of [place] Chart? No - copy requested  No - copy requested  Would patient like information on creating a medical advance directive?  No - Patient declined     Current Medications (verified) Outpatient Encounter Medications as of 03/16/2022  Medication Sig   acetaminophen (TYLENOL) 325 MG tablet Take 650 mg by mouth every 6 (six) hours as needed.   clotrimazole-betamethasone (LOTRISONE) cream Apply 1 application topically 2 (two) times daily.   fluticasone (FLONASE) 50 MCG/ACT nasal spray Place 2 sprays into both nostrils daily.   ketoconazole (NIZORAL) 2 % shampoo APPLY TOPICALLY TWICE A WEEK   levocetirizine (XYZAL) 5 MG tablet Take 5 mg by mouth every evening.   nitrofurantoin, macrocrystal-monohydrate, (MACROBID) 100 MG capsule TAKE 1 CAPSULE (100 MG TOTAL)  BY MOUTH DAILY AS NEEDED (AFTER INTERCOURSE)   rosuvastatin (CRESTOR) 20 MG tablet TAKE 1 TABLET BY MOUTH EVERY DAY   Facility-Administered Encounter Medications as of 03/16/2022  Medication   denosumab (PROLIA) injection 60 mg    Allergies (verified) Codeine   History: Past Medical History:  Diagnosis Date   Allergy    Arthritis    Chronic kidney disease    kidney stones in 1985, tumor on uretha,    Colon polyps    Estrogen deficiency    Eustachian tube dysfunction    Hyperlipidemia    no meds   Osteoporosis    Past Surgical History:  Procedure Laterality Date   ABDOMINAL HYSTERECTOMY     TAH BSO by accident at 89   BREAST BIOPSY Right 01/2021   [place]   COLONOSCOPY WITH PROPOFOL N/A 11/13/2018   Procedure: COLONOSCOPY WITH PROPOFOL;  Surgeon: MIrving Copas, MD;  [facility];  Service: Gastroenterology;  Laterality: N/A;  NEEDS TO BE A 90 MIN SPOT   meniscal tear     right   OOPHORECTOMY     OVARY SURGERY     1982   POLYPECTOMY  11/13/2018   Procedure: POLYPECTOMY;  Surgeon: Mansouraty, GTelford Nab, MD;  Location: [facility];  Service: Gastroenterology;;   ureteral tumor removal     Family History  Problem Relation Age of Onset   Breast cancer Mother 758  Heart disease Mother    Alzheimer's disease  Mother    Arrhythmia Father    Heart failure Father    Heart disease Father    Atrial fibrillation Father    Stroke Father 52   Heart failure Brother    Brain cancer Brother    Stomach cancer Neg Hx    Colon cancer Neg Hx    Pancreatic cancer Neg Hx    Esophageal cancer Neg Hx    Social History   Socioeconomic History   Marital status: Married    Spouse name: Not on file   Number of children: 1   Years of education: Not on file   Highest education level: Not on file  Occupational History   Not on file  Tobacco Use   Smoking status: Never   Smokeless tobacco: Never   Vaping Use   Vaping Use: Never used  Substance and Sexual Activity   Alcohol use: No   Drug use: No   Sexual activity: Not on file  Other Topics Concern   Not on file  Social History Narrative   Retired   Investment banker, operational of Health   Financial Resource Strain: [place]  (03/16/2022)   Overall Financial Resource Strain (CARDIA)    Difficulty of Paying Living Expenses: Not hard at all  Food Insecurity: No Food Insecurity (03/16/2022)   Hunger Vital Sign    Worried About Running Out of Food in the Last Year: Never true    Ran Out of Food in the Last Year: Never true  Transportation Needs: No Transportation Needs (03/16/2022)   PRAPARE - Hydrologist (Medical): No    Lack of Transportation (Non-Medical): No  Physical Activity: Inactive (03/16/2022)   Exercise Vital Sign    Days of Exercise per Week: 0 days    Minutes of Exercise per Session: 0 min  Stress: No Stress Concern Present (03/16/2022)   [place]    Feeling of Stress : Not at all  Social Connections: Moderately Integrated (03/16/2022)   Social Connection and Isolation Panel [NHANES]    Frequency of Communication with Friends and Family: More than three times a week    Frequency of Social Gatherings with Friends and Family: Three times a week    Attends Religious Services: More than 4 times per year    Active Member of Clubs or Organizations: No    Attends Archivist Meetings: Never    Marital Status: Married    Tobacco Counseling Counseling given: Not Answered   Clinical Intake:  Pre-visit preparation completed: Yes  Pain : No/denies pain  Diabetes: No  How often do you need to have someone help you when you read instructions, pamphlets, or other written materials from your doctor or pharmacy?: 1 - Never  Diabetic?No   Interpreter Needed?: No  Information entered by :: Denman George  LPN   Activities of Daily Living    03/16/2022    1:38 PM  In your present state of health, do you have any difficulty performing the following activities:  Hearing? 0  Vision? 0  Difficulty concentrating or making decisions? 0  Walking or climbing stairs? 0  Dressing or bathing? 0  Doing errands, shopping? 0  Preparing Food and eating ? N  Using the Toilet? N  In the past six months, have you accidently leaked urine? N  Do you have problems with loss of bowel control? N  Managing your Medications? N  Managing your Finances? N  Housekeeping or managing your Housekeeping? N    Patient Care Team: Susy Frizzle, MD as PCP - General (Family Medicine)  Indicate any recent Medical Services you may have received from other than [facility] providers in the past year (date may be approximate).     Assessment:   This is a routine wellness examination for Aiko.  Hearing/Vision screen Hearing Screening - Comments:: Denies hearing difficulty   Vision Screening - Comments:: up to date with routine eye exams with Dr. Norlene Duel    Dietary issues and exercise activities discussed: Current Exercise Habits: The patient does not participate in regular exercise at present   Goals Addressed   None   Depression Screen    03/16/2022    1:36 PM 06/03/2021    9:35 AM 01/20/2021    9:07 AM 04/08/2020    8:14 AM 10/27/2019   10:46 AM 03/08/2017   10:27 AM  PHQ 2/9 Scores  PHQ - 2 Score 0 0 0 0 0 0  PHQ- 9 Score  0    3    Fall Risk    03/16/2022    1:36 PM 06/03/2021    9:35 AM 01/20/2021    9:07 AM 04/08/2020    8:14 AM 10/27/2019   10:46 AM  [place] in the past year? 0 0 0 0 0  Number falls in past yr: 0 0 0  0  Injury with Fall? 0 0 0  0  Risk for fall due to : No Fall Risks  No Fall Risks    Follow up Falls evaluation completed;Education provided;Falls prevention discussed  Falls evaluation completed      FALL RISK PREVENTION PERTAINING TO THE HOME:  Any stairs in  or around the home? Yes  If so, are there any without handrails? No  Home free of loose throw rugs in walkways, pet beds, electrical cords, etc? Yes  Adequate lighting in your home to reduce risk of falls? Yes   ASSISTIVE DEVICES UTILIZED TO PREVENT FALLS:  Life alert? No  Use of a cane, walker or w/c? No  Grab bars in the bathroom? Yes  Shower chair or bench in shower? No  Elevated toilet seat or a handicapped toilet? Yes   TIMED UP AND GO:  Was the test performed? No . Telephonic visit   Cognitive Function:        03/16/2022    1:38 PM  6CIT Screen  What Year? 0 points  What month? 0 points  What time? 0 points  Count back from 20 0 points  Months in reverse 0 points  Repeat phrase 0 points  Total Score 0 points    Immunizations Immunization History  Administered Date(s) Administered   Fluad Quad(high Dose 65+) 11/27/2018, 12/18/2019, 01/03/2021   Influenza, High Dose Seasonal PF 01/25/2018   Influenza,inj,Quad PF,6+ Mos 03/08/2017   PFIZER(Purple Top)SARS-COV-2 Vaccination 04/14/2019, 05/05/2019   Pneumococcal Conjugate-13 10/27/2019   Pneumococcal Polysaccharide-23 03/08/2017    TDAP status: Due, Education has been provided regarding the importance of this vaccine. Advised may receive this vaccine at local pharmacy or Health Dept. Aware to provide a copy of the vaccination record if obtained from local pharmacy or Health Dept. Verbalized acceptance and understanding.  Flu Vaccine status: Due, Education has been provided regarding the importance of this vaccine. Advised may receive this vaccine at local pharmacy or Health Dept. Aware to provide a copy of the vaccination record if obtained from local pharmacy or Health Dept.  Verbalized acceptance and understanding.  Pneumococcal vaccine status: Up to date  Covid-19 vaccine status: Information provided on how to obtain vaccines.   Qualifies for Shingles Vaccine? Yes   Zostavax completed No   Shingrix Completed?:  No.    Education has been provided regarding the importance of this vaccine. Patient has been advised to call insurance company to determine out of pocket expense if they have not yet received this vaccine. Advised may also receive vaccine at local pharmacy or Health Dept. Verbalized acceptance and understanding.  Screening Tests Health Maintenance  Topic Date Due   Hepatitis C Screening  Never done   DTaP/Tdap/Td (1 - Tdap) Never done   Zoster Vaccines- Shingrix (1 of 2) Never done   INFLUENZA VACCINE  10/18/2021   COVID-19 Vaccine (3 - 2023-24 season) 11/18/2021   MAMMOGRAM  12/27/2022   Medicare Annual Wellness (AWV)  03/17/2023   COLONOSCOPY (Pts 45-102yr Insurance coverage will need to be confirmed)  11/12/2028   Pneumonia Vaccine 71 Years old  Completed   DEXA SCAN  Completed   HPV VACCINES  Aged Out    Health Maintenance  Health Maintenance Due  Topic Date Due   Hepatitis C Screening  Never done   DTaP/Tdap/Td (1 - Tdap) Never done   Zoster Vaccines- Shingrix (1 of 2) Never done   INFLUENZA VACCINE  10/18/2021   COVID-19 Vaccine (3 - 2023-24 season) 11/18/2021    Colorectal cancer screening: Type of screening: Colonoscopy. Completed 11/13/18. Repeat every 10 years  Mammogram status: Completed 12/26/21. Repeat every year  Bone Density status: Completed 09/24/20. Results reflect: Bone density results: OSTEOPOROSIS. Repeat every as directed  years.  Lung Cancer Screening: (Low Dose CT Chest recommended if Age 71-80years, 30 pack-year currently smoking OR have quit w/in 15years.) does not qualify.   Lung Cancer Screening Referral: n/a  Additional Screening:  Hepatitis C Screening: does qualify; Completed at next office visit   Vision Screening: Recommended annual ophthalmology exams for early detection of glaucoma and other disorders of the eye. Is the patient up to date with their annual eye exam?  Yes  Who is the provider or what is the name of the office in which the  patient attends annual eye exams? Dr. ANorlene Duel If pt is not established with a provider, would they like to be referred to a provider to establish care? No .   Dental Screening: Recommended annual dental exams for proper oral hygiene  Community Resource Referral / Chronic Care Management: CRR required this visit?  No   CCM required this visit?  No      Plan:     I have personally reviewed and noted the following in the patient's chart:   Medical and social history Use of alcohol, tobacco or illicit drugs  Current medications and supplements including opioid prescriptions. Patient is not currently taking opioid prescriptions. Functional ability and status Nutritional status Physical activity Advanced directives List of other physicians Hospitalizations, surgeries, and ER visits in previous 12 months Vitals Screenings to include cognitive, depression, and falls Referrals and appointments  In addition, I have reviewed and discussed with patient certain preventive protocols, quality metrics, and best practice recommendations. A written personalized care plan for preventive services as well as general preventive health recommendations were provided to patient.     SVanetta Mulders [place]  [id number]  Due to this being a virtual visit, the after visit summary with patients personalized plan was offered to patient via  mail or my-chart. Patient would like to access on my-chart  Nurse Notes: No concerns; Patient to be scheduled for Prolia and flu shots

## 2022-04-03 ENCOUNTER — Other Ambulatory Visit: Payer: Self-pay

## 2022-04-20 ENCOUNTER — Other Ambulatory Visit: Payer: Self-pay | Admitting: Physician Assistant

## 2022-04-20 DIAGNOSIS — Z01818 Encounter for other preprocedural examination: Secondary | ICD-10-CM

## 2022-04-28 NOTE — Patient Instructions (Addendum)
SURGICAL WAITING ROOM VISITATION  Patients having surgery or a procedure may have no more than 2 support people in the waiting area - these visitors may rotate.    Children under the age of 38 must have an adult with them who is not the patient.  Due to an increase in RSV and influenza rates and associated hospitalizations, children ages 26 and under may not visit patients in [place].  If the patient needs to stay at the hospital during part of their recovery, the visitor guidelines for inpatient rooms apply. Pre-op nurse will coordinate an appropriate time for 1 support person to accompany patient in pre-op.  This support person may not rotate.    Please refer to the [facility] website for the visitor guidelines for Inpatients (after your surgery is over and you are in a regular room).       Your procedure is scheduled on:  05/09/2022    Report to [facility] Main Entrance    Report to admitting at 0730 AM   Call this number if you have problems the morning of surgery [phone]   Do not eat food :After Midnight.   After Midnight you may have the following liquids until __0630____ AM DAY OF SURGERY  Water Non-Citrus Juices (without pulp, NO RED-Apple, White grape, White cranberry) Black Coffee (NO MILK/CREAM OR CREAMERS, sugar ok)  Clear Tea (NO MILK/CREAM OR CREAMERS, sugar ok) regular and decaf                             Plain Jell-O (NO RED)                                           Fruit ices (not with fruit pulp, NO RED)                                     Popsicles (NO RED)                                                               Sports drinks like Gatorade (NO RED)                      The day of surgery:  Drink ONE (1) Pre-Surgery Clear Ensure or G2 at   0630AM ( have completed by )  the morning of surgery. Drink in one sitting. Do not sip.  This drink was given to you during your hospital  pre-op appointment visit. Nothing else to  drink after completing the  Pre-Surgery Clear Ensure or G2.          If you have questions, please contact your surgeon's office.       Oral Hygiene is also important to reduce your risk of infection.                                    Remember - BRUSH YOUR TEETH THE MORNING OF SURGERY WITH  YOUR REGULAR TOOTHPASTE  DENTURES WILL BE REMOVED PRIOR TO SURGERY PLEASE DO NOT APPLY "Poly grip" OR ADHESIVES!!!   Do NOT smoke after Midnight   Take these medicines the morning of surgery with A SIP OF WATER:   none   DO NOT TAKE ANY ORAL DIABETIC MEDICATIONS DAY OF YOUR SURGERY  Bring CPAP mask and tubing day of surgery.                              You may not have any metal on your body including hair pins, jewelry, and body piercing             Do not wear make-up, lotions, powders, perfumes/cologne, or deodorant  Do not wear nail polish including gel and S&S, artificial/acrylic nails, or any other type of covering on natural nails including finger and toenails. If you have artificial nails, gel coating, etc. that needs to be removed by a nail salon please have this removed prior to surgery or surgery may need to be canceled/ delayed if the surgeon/ anesthesia feels like they are unable to be safely monitored.   Do not shave  48 hours prior to surgery.               Men may shave face and neck.   Do not bring valuables to the hospital. [place].   Contacts, glasses, dentures or bridgework may not be worn into surgery.   Bring small overnight bag day of surgery.   DO NOT [place]. PHARMACY WILL DISPENSE MEDICATIONS LISTED ON YOUR MEDICATION LIST TO YOU DURING YOUR ADMISSION [place]!    Patients discharged on the day of surgery will not be allowed to drive home.  Someone NEEDS to stay with you for the first 24 hours after anesthesia.   Special Instructions: Bring a copy of your healthcare  power of attorney and living will documents the day of surgery if you haven't scanned them before.              Please read over the following fact sheets you were given: IF [place] [phone]   If you received a COVID test during your pre-op visit  it is requested that you wear a mask when out in public, stay away from anyone that may not be feeling well and notify your surgeon if you develop symptoms. If you test positive for Covid or have been in contact with anyone that has tested positive in the last 10 days please notify you surgeon.    [facility] - Preparing for Surgery Before surgery, you can play an important role.  Because skin is not sterile, your skin needs to be as free of germs as possible.  You can reduce the number of germs on your skin by washing with CHG (chlorahexidine gluconate) soap before surgery.  CHG is an antiseptic cleaner which kills germs and bonds with the skin to continue killing germs even after washing. Please DO NOT use if you have an allergy to CHG or antibacterial soaps.  If your skin becomes reddened/irritated stop using the CHG and inform your nurse when you arrive at Short Stay. Do not shave (including legs and underarms) for at least 48 hours prior to the first CHG  shower.  You may shave your face/neck. Please follow these instructions carefully:  1.  Shower with CHG Soap the night before surgery and the  morning of Surgery.  2.  If you choose to wash your hair, wash your hair first as usual with your  normal  shampoo.  3.  After you shampoo, rinse your hair and body thoroughly to remove the  shampoo.                           4.  Use CHG as you would any other liquid soap.  You can apply chg directly  to the skin and wash                       Gently with a scrungie or clean washcloth.  5.  Apply the CHG Soap to your body ONLY FROM THE NECK DOWN.   Do not use on face/ open                           Wound  or open sores. Avoid contact with eyes, ears mouth and genitals (private parts).                       Wash face,  Genitals (private parts) with your normal soap.             6.  Wash thoroughly, paying special attention to the area where your surgery  will be performed.  7.  Thoroughly rinse your body with warm water from the neck down.  8.  DO NOT shower/wash with your normal soap after using and rinsing off  the CHG Soap.                9.  Pat yourself dry with a clean towel.            10.  Wear clean pajamas.            11.  Place clean sheets on your bed the night of your first shower and do not  sleep with pets. Day of Surgery : Do not apply any lotions/deodorants the morning of surgery.  Please wear clean clothes to the hospital/surgery center.  FAILURE TO FOLLOW THESE INSTRUCTIONS MAY RESULT IN THE CANCELLATION OF YOUR SURGERY PATIENT SIGNATURE_________________________________  NURSE SIGNATURE__________________________________  ________________________________________________________________________

## 2022-04-28 NOTE — Progress Notes (Signed)
Anesthesia Review:  PCP: Lamar Blinks  Cardiologist : Chest x-ray : 2v- 11/23/21  EKG : 11/23/21  CT Card- 01/09/22  PFT- 11/24/21  Echo : Stress test: Cardiac Cath :  Activity level:  Sleep Study/ CPAP : Fasting Blood Sugar :      / Checks Blood Sugar -- times a day:   Blood Thinner/ Instructions /Last Dose: ASA / Instructions/ Last Dose :    81 mg aspirin  DM- type  Hgba1c-  Semaglutide weekly

## 2022-05-01 ENCOUNTER — Other Ambulatory Visit: Payer: Medicare PPO

## 2022-05-01 ENCOUNTER — Other Ambulatory Visit: Payer: Self-pay

## 2022-05-01 ENCOUNTER — Encounter (HOSPITAL_COMMUNITY)
Admission: RE | Admit: 2022-05-01 | Discharge: 2022-05-01 | Disposition: A | Discharge status: Home / Self Care | Payer: Medicare PPO | Source: Ambulatory Visit | Attending: Orthopaedic Surgery | Admitting: Orthopaedic Surgery

## 2022-05-01 ENCOUNTER — Encounter (HOSPITAL_COMMUNITY): Payer: Self-pay

## 2022-05-01 DIAGNOSIS — Z01818 Encounter for other preprocedural examination: Secondary | ICD-10-CM | POA: Diagnosis present

## 2022-05-01 DIAGNOSIS — R7309 Other abnormal glucose: Secondary | ICD-10-CM | POA: Diagnosis not present

## 2022-05-01 HISTORY — DX: Blepharospasm: G24.5

## 2022-05-01 HISTORY — DX: Prediabetes: R73.03

## 2022-05-01 HISTORY — DX: Personal history of urinary calculi: Z87.442

## 2022-05-01 LAB — COMPREHENSIVE METABOLIC PANEL
ALT: 10 U/L (ref 0–44)
AST: 17 U/L (ref 15–41)
Albumin: 4 g/dL (ref 3.5–5.0)
Alkaline Phosphatase: 93 U/L (ref 38–126)
Anion gap: 7 (ref 5–15)
BUN: 17 mg/dL (ref 8–23)
CO2: 24 mmol/L (ref 22–32)
Calcium: 9.9 mg/dL (ref 8.9–10.3)
Chloride: 107 mmol/L (ref 98–111)
Creatinine, Ser: 1.05 mg/dL — ABNORMAL HIGH (ref 0.44–1.00)
GFR, Estimated: 57 mL/min — ABNORMAL LOW (ref >60)
Glucose, Bld: 102 mg/dL — ABNORMAL HIGH (ref 70–99)
Potassium: 4.2 mmol/L (ref 3.5–5.1)
Sodium: 138 mmol/L (ref 135–145)
Total Bilirubin: 0.7 mg/dL (ref 0.3–1.2)
Total Protein: 7.9 g/dL (ref 6.5–8.1)

## 2022-05-01 LAB — CBC
HCT: 39.9 % (ref 36.0–46.0)
Hemoglobin: 12.8 g/dL (ref 12.0–15.0)
MCH: 31.8 pg (ref 26.0–34.0)
MCHC: 32.1 g/dL (ref 30.0–36.0)
MCV: 99.3 fL (ref 80.0–100.0)
Platelets: 310 10*3/uL (ref 150–400)
RBC: 4.02 MIL/uL (ref 3.87–5.11)
RDW: 13 % (ref 11.5–15.5)
WBC: 9.2 10*3/uL (ref 4.0–10.5)
nRBC: 0 % (ref 0.0–0.2)

## 2022-05-01 LAB — GLUCOSE, CAPILLARY: Glucose-Capillary: 95 mg/dL (ref 70–99)

## 2022-05-01 LAB — HEMOGLOBIN A1C
Hgb A1c MFr Bld: 5.4 % (ref 4.8–5.6)
Mean Plasma Glucose: 108.28 mg/dL

## 2022-05-01 LAB — SURGICAL PCR SCREEN
MRSA, PCR: NEGATIVE
Staphylococcus aureus: NEGATIVE

## 2022-05-04 ENCOUNTER — Telehealth: Payer: Self-pay | Admitting: *Deleted

## 2022-05-04 NOTE — Telephone Encounter (Signed)
Ortho [facility] pre-op call completed. 

## 2022-05-04 NOTE — Care Plan (Signed)
[facility] RNCM call to patient prior to surgery to discuss her upcoming Left total knee arthroplasty with Dr. Ninfa Linden on 05/05/22. She is an Ortho bundle patient through [facility] and is agreeable to case management. She lives with her spouse, who will be assisting at her home after discharge. She has a RW already. Anticipate HHPT will be needed. Patient requested referral to [facility] in [place], [place] area. They can see her in the home and transition over to [place] when acceptable. Reviewed all post op care instructions. Will continue to follow for needs.

## 2022-05-04 NOTE — H&P (Signed)
TOTAL KNEE ADMISSION H&P  Patient is being admitted for left total knee arthroplasty.  Subjective:  Chief Complaint:left knee pain.  HPI: Michelle Foster, 72 y.o. female, has a history of pain and functional disability in the left knee due to arthritis and has failed non-surgical conservative treatments for greater than 12 weeks to includeNSAID's and/or analgesics, corticosteriod injections, use of assistive devices, and activity modification.  Onset of symptoms was gradual, starting 1 years ago with rapidlly worsening course since that time. The patient noted prior procedures on the knee to include  arthroscopy and menisectomy on the left knee(s).  Patient currently rates pain in the left knee(s) at 10 out of 10 with activity. Patient has night pain, worsening of pain with activity and weight bearing, pain that interferes with activities of daily living, pain with passive range of motion, crepitus, and joint swelling.  Patient has evidence of subchondral sclerosis, periarticular osteophytes, and joint space narrowing by imaging studies. There is no active infection.  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,    Colon polyps    Estrogen deficiency    Eustachian tube dysfunction    History of kidney stones    Hyperlipidemia    no meds   Osteoporosis    Pre-diabetes     Past Surgical History:  Procedure Laterality Date   ABDOMINAL HYSTERECTOMY     TAH BSO by accident at 54    bilateral cataract surgery      BREAST BIOPSY Right 01/2021   COLONOSCOPY   2012   done in [place], physician has since retired   COLONOSCOPY WITH PROPOFOL N/A 11/13/2018   Procedure: COLONOSCOPY WITH PROPOFOL;  Surgeon: Irving Copas., MD;  Location: [facility];  Service: Gastroenterology;  Laterality: N/A;  NEEDS TO BE A 90 MIN SPOT   left knee meniscus tear      meniscal tear     right   OOPHORECTOMY     OVARY SURGERY     1982   POLYPECTOMY  11/13/2018   Procedure: POLYPECTOMY;  Surgeon: Irving Copas., MD;  Location: [facility];  Service: Gastroenterology;;   ureteral tumor removal      Current Facility-Administered Medications  Medication Dose Route Frequency Provider Last Rate Last Admin   denosumab (PROLIA) injection 60 mg  60 mg Subcutaneous Q6 months Susy Frizzle, MD   60 mg at 01/03/21 1537   Current Outpatient Medications  Medication Sig Dispense Refill Last Dose   acetaminophen (TYLENOL) 325 MG tablet Take 650 mg by mouth every 6 (six) hours as needed for moderate pain.      aspirin EC 81 MG tablet Take 81 mg by mouth daily. Swallow whole.      ibuprofen (ADVIL) 200 MG tablet Take 400 mg by mouth every 6 (six) hours as needed.      ketoconazole (NIZORAL) 2 % cream Apply 1 Application topically daily as needed for irritation.      levocetirizine (XYZAL) 5 MG tablet Take 5 mg by mouth  at bedtime.      nitrofurantoin, macrocrystal-monohydrate, (MACROBID) 100 MG capsule TAKE 1 CAPSULE (100 MG TOTAL) BY MOUTH DAILY AS NEEDED (AFTER INTERCOURSE) 30 capsule 0    rosuvastatin (CRESTOR) 20 MG tablet TAKE 1 TABLET BY MOUTH EVERY DAY (Patient taking differently: Take 20 mg by mouth at bedtime.) 90 tablet 1    clotrimazole-betamethasone (LOTRISONE) cream Apply 1 application topically 2 (two) times daily. (Patient not taking: Reported on 04/27/2022) 30 g 0 Not Taking   fluticasone (FLONASE) 50 MCG/ACT nasal spray Place 2 sprays into both nostrils daily. (Patient not taking: Reported on 04/27/2022) 16 g 6 Not Taking   ketoconazole (NIZORAL) 2  % shampoo APPLY TOPICALLY TWICE A WEEK (Patient not taking: Reported on 04/27/2022) 120 mL 1 Not Taking   Allergies  Allergen Reactions   Codeine Nausea And Vomiting    Social History   Tobacco Use   Smoking status: Never   Smokeless tobacco: Never  Substance Use Topics   Alcohol use: No    Family History  Problem Relation Age of Onset   Breast cancer Mother 81   Heart disease Mother    Alzheimer's disease Mother    Arrhythmia Father    Heart failure Father    Heart disease Father    Atrial fibrillation Father    Stroke Father 26   Heart failure Brother    Brain cancer Brother    Stomach cancer Neg Hx    Colon cancer Neg Hx    Pancreatic cancer Neg Hx    Esophageal cancer Neg Hx      Review of Systems  All other systems reviewed and are negative.   Objective:  Physical Exam Vitals reviewed.  Constitutional:      Appearance: Normal appearance. She is normal weight.  HENT:     Head: Normocephalic and atraumatic.  Eyes:     Extraocular Movements: Extraocular movements intact.     Pupils: Pupils are equal, round, and reactive to light.  Cardiovascular:     Rate and Rhythm: Normal rate and regular rhythm.  Pulmonary:     Effort: Pulmonary effort is normal.     Breath sounds: Normal breath sounds.  Abdominal:     Palpations: Abdomen is soft.  Musculoskeletal:     Cervical back: Normal range of motion and neck supple.     Left knee: Effusion, bony tenderness and crepitus present. Decreased range of motion. Tenderness present over the medial joint line. Abnormal alignment and abnormal meniscus.  Neurological:     Mental Status: She is alert and oriented to person, place, and time.  Psychiatric:        Behavior: Behavior normal.     Vital signs in last 24 hours:    Labs:   Estimated body mass index is 27.2 kg/m as calculated from the following:   Height as of 05/01/22: 5' 3.5" (1.613 m).   Weight as of 05/01/22: 70.8 kg.   Imaging Review Plain  radiographs demonstrate severe degenerative joint disease of the left knee(s). The overall alignment ismild varus. The bone quality appears to be excellent for age and reported activity level.      Assessment/Plan:  End stage arthritis, left knee   The patient history, physical examination, clinical judgment of the provider and imaging studies are consistent with end stage degenerative joint disease of the left knee(s) and total knee arthroplasty is deemed medically necessary. The treatment options including medical management, injection therapy arthroscopy and arthroplasty were discussed at length. The  risks and benefits of total knee arthroplasty were presented and reviewed. The risks due to aseptic loosening, infection, stiffness, patella tracking problems, thromboembolic complications and other imponderables were discussed. The patient acknowledged the explanation, agreed to proceed with the plan and consent was signed. Patient is being admitted for inpatient treatment for surgery, pain control, PT, OT, prophylactic antibiotics, VTE prophylaxis, progressive ambulation and ADL's and discharge planning. The patient is planning to be discharged home with home health services

## 2022-05-05 ENCOUNTER — Observation Stay (HOSPITAL_COMMUNITY): Payer: Medicare PPO

## 2022-05-05 ENCOUNTER — Ambulatory Visit (HOSPITAL_COMMUNITY): Payer: Medicare PPO | Admitting: Physician Assistant

## 2022-05-05 ENCOUNTER — Encounter (HOSPITAL_COMMUNITY)
Admission: RE | Disposition: A | Discharge status: Home Health | Payer: Self-pay | Source: Ambulatory Visit | Attending: Orthopaedic Surgery

## 2022-05-05 ENCOUNTER — Ambulatory Visit (HOSPITAL_BASED_OUTPATIENT_CLINIC_OR_DEPARTMENT_OTHER): Payer: Medicare PPO | Admitting: Anesthesiology

## 2022-05-05 ENCOUNTER — Encounter (HOSPITAL_COMMUNITY): Payer: Self-pay | Admitting: Orthopaedic Surgery

## 2022-05-05 ENCOUNTER — Other Ambulatory Visit: Payer: Self-pay

## 2022-05-05 ENCOUNTER — Observation Stay (HOSPITAL_COMMUNITY)
Admission: RE | Admit: 2022-05-05 | Discharge: 2022-05-08 | Disposition: A | Discharge status: Home Health | Payer: Medicare PPO | Source: Ambulatory Visit | Attending: Orthopaedic Surgery | Admitting: Orthopaedic Surgery

## 2022-05-05 DIAGNOSIS — M1712 Unilateral primary osteoarthritis, left knee: Principal | ICD-10-CM | POA: Diagnosis present

## 2022-05-05 DIAGNOSIS — N189 Chronic kidney disease, unspecified: Secondary | ICD-10-CM | POA: Diagnosis not present

## 2022-05-05 DIAGNOSIS — Z79899 Other long term (current) drug therapy: Secondary | ICD-10-CM | POA: Diagnosis not present

## 2022-05-05 DIAGNOSIS — Z96652 Presence of left artificial knee joint: Secondary | ICD-10-CM

## 2022-05-05 DIAGNOSIS — Z7982 Long term (current) use of aspirin: Secondary | ICD-10-CM | POA: Insufficient documentation

## 2022-05-05 HISTORY — PX: TOTAL KNEE ARTHROPLASTY: SHX125

## 2022-05-05 LAB — TYPE AND SCREEN
ABO/RH(D): B POS
Antibody Screen: NEGATIVE

## 2022-05-05 LAB — ABO/RH: ABO/RH(D): B POS

## 2022-05-05 SURGERY — ARTHROPLASTY, KNEE, TOTAL
Anesthesia: Spinal | Site: Knee | Laterality: Left

## 2022-05-05 MED ORDER — CEFAZOLIN SODIUM-DEXTROSE 1-4 GM/50ML-% IV SOLN
1.0000 g | Freq: Four times a day (QID) | INTRAVENOUS | Status: AC
  Administered 2022-05-05 – 2022-05-06 (×2): 1 g via INTRAVENOUS
  Filled 2022-05-05 (×2): qty 50

## 2022-05-05 MED ORDER — PHENYLEPHRINE HCL (PRESSORS) 10 MG/ML IV SOLN
INTRAVENOUS | Status: AC
  Filled 2022-05-05: qty 1

## 2022-05-05 MED ORDER — OXYCODONE HCL 5 MG/5ML PO SOLN: 5.0000 mg | Freq: Once | ORAL | Status: DC | PRN

## 2022-05-05 MED ORDER — CHLORHEXIDINE GLUCONATE 0.12 % MT SOLN
15.0000 mL | Freq: Once | OROMUCOSAL | Status: AC
  Administered 2022-05-05: 15 mL via OROMUCOSAL

## 2022-05-05 MED ORDER — LIDOCAINE HCL (CARDIAC) PF 100 MG/5ML IV SOSY
PREFILLED_SYRINGE | INTRAVENOUS | Status: DC | PRN
  Administered 2022-05-05: 20 mg via INTRAVENOUS
  Administered 2022-05-05: 40 mg via INTRAVENOUS

## 2022-05-05 MED ORDER — LACTATED RINGERS IV SOLN: INTRAVENOUS | Status: DC

## 2022-05-05 MED ORDER — CEFAZOLIN SODIUM-DEXTROSE 2-4 GM/100ML-% IV SOLN
2.0000 g | INTRAVENOUS | Status: AC
  Administered 2022-05-05: 2 g via INTRAVENOUS
  Filled 2022-05-05: qty 100

## 2022-05-05 MED ORDER — HYDROMORPHONE HCL 2 MG PO TABS: 2.0000 mg | ORAL_TABLET | ORAL | Status: DC | PRN

## 2022-05-05 MED ORDER — OXYCODONE HCL 5 MG PO TABS
5.0000 mg | ORAL_TABLET | ORAL | Status: DC | PRN
  Administered 2022-05-05: 5 mg via ORAL
  Administered 2022-05-06 – 2022-05-08 (×10): 10 mg via ORAL
  Filled 2022-05-05 (×2): qty 2
  Filled 2022-05-05: qty 1
  Filled 2022-05-05 (×9): qty 2

## 2022-05-05 MED ORDER — HYDROMORPHONE HCL 1 MG/ML IJ SOLN: 0.2500 mg | INTRAMUSCULAR | Status: DC | PRN

## 2022-05-05 MED ORDER — ONDANSETRON HCL 4 MG/2ML IJ SOLN
INTRAMUSCULAR | Status: AC
  Filled 2022-05-05: qty 2

## 2022-05-05 MED ORDER — ACETAMINOPHEN 325 MG PO TABS
325.0000 mg | ORAL_TABLET | Freq: Four times a day (QID) | ORAL | Status: DC | PRN
  Administered 2022-05-07: 650 mg via ORAL
  Filled 2022-05-05 (×2): qty 2

## 2022-05-05 MED ORDER — BUPIVACAINE IN DEXTROSE 0.75-8.25 % IT SOLN
INTRATHECAL | Status: DC | PRN
  Administered 2022-05-05: 1.6 mL via INTRATHECAL

## 2022-05-05 MED ORDER — OXYCODONE HCL 5 MG PO TABS: 5.0000 mg | ORAL_TABLET | Freq: Once | ORAL | Status: DC | PRN

## 2022-05-05 MED ORDER — FENTANYL CITRATE (PF) 100 MCG/2ML IJ SOLN
INTRAMUSCULAR | Status: DC | PRN
  Administered 2022-05-05: 50 ug via INTRAVENOUS

## 2022-05-05 MED ORDER — GLYCOPYRROLATE 0.2 MG/ML IJ SOLN
INTRAMUSCULAR | Status: AC
  Filled 2022-05-05: qty 1

## 2022-05-05 MED ORDER — POLYETHYLENE GLYCOL 3350 17 G PO PACK: 17.0000 g | PACK | Freq: Every day | ORAL | Status: DC | PRN

## 2022-05-05 MED ORDER — LIDOCAINE HCL (PF) 2 % IJ SOLN
INTRAMUSCULAR | Status: AC
  Filled 2022-05-05: qty 5

## 2022-05-05 MED ORDER — PROPOFOL 500 MG/50ML IV EMUL
INTRAVENOUS | Status: DC | PRN
  Administered 2022-05-05: 50 ug/kg/min via INTRAVENOUS

## 2022-05-05 MED ORDER — 0.9 % SODIUM CHLORIDE (POUR BTL) OPTIME
TOPICAL | Status: DC | PRN
  Administered 2022-05-05: 1000 mL

## 2022-05-05 MED ORDER — GLYCOPYRROLATE 0.2 MG/ML IJ SOLN
INTRAMUSCULAR | Status: DC | PRN
  Administered 2022-05-05: .2 mg via INTRAVENOUS

## 2022-05-05 MED ORDER — ONDANSETRON HCL 4 MG PO TABS: 4.0000 mg | ORAL_TABLET | Freq: Four times a day (QID) | ORAL | Status: DC | PRN

## 2022-05-05 MED ORDER — PROPOFOL 1000 MG/100ML IV EMUL
INTRAVENOUS | Status: AC
  Filled 2022-05-05: qty 100

## 2022-05-05 MED ORDER — DIPHENHYDRAMINE HCL 12.5 MG/5ML PO ELIX
12.5000 mg | ORAL_SOLUTION | ORAL | Status: DC | PRN
  Administered 2022-05-05: 25 mg via ORAL
  Filled 2022-05-05: qty 10

## 2022-05-05 MED ORDER — ALUM & MAG HYDROXIDE-SIMETH 200-200-20 MG/5ML PO SUSP
30.0000 mL | ORAL | Status: DC | PRN
  Administered 2022-05-08: 30 mL via ORAL
  Filled 2022-05-05: qty 30

## 2022-05-05 MED ORDER — MIDAZOLAM HCL 5 MG/5ML IJ SOLN
INTRAMUSCULAR | Status: DC | PRN
  Administered 2022-05-05: .5 mg via INTRAVENOUS

## 2022-05-05 MED ORDER — TRANEXAMIC ACID-NACL 1000-0.7 MG/100ML-% IV SOLN
1000.0000 mg | INTRAVENOUS | Status: AC
  Administered 2022-05-05: 1000 mg via INTRAVENOUS
  Filled 2022-05-05: qty 100

## 2022-05-05 MED ORDER — POVIDONE-IODINE 10 % EX SWAB
2.0000 | Freq: Once | CUTANEOUS | Status: AC
  Administered 2022-05-05: 2 via TOPICAL

## 2022-05-05 MED ORDER — PHENOL 1.4 % MT LIQD: 1.0000 | OROMUCOSAL | Status: DC | PRN

## 2022-05-05 MED ORDER — DOCUSATE SODIUM 100 MG PO CAPS
100.0000 mg | ORAL_CAPSULE | Freq: Two times a day (BID) | ORAL | Status: DC
  Administered 2022-05-05 – 2022-05-08 (×6): 100 mg via ORAL
  Filled 2022-05-05 (×6): qty 1

## 2022-05-05 MED ORDER — METOCLOPRAMIDE HCL 5 MG/ML IJ SOLN: 5.0000 mg | Freq: Three times a day (TID) | INTRAMUSCULAR | Status: DC | PRN

## 2022-05-05 MED ORDER — PANTOPRAZOLE SODIUM 40 MG PO TBEC
40.0000 mg | DELAYED_RELEASE_TABLET | Freq: Every day | ORAL | Status: DC
  Administered 2022-05-05 – 2022-05-08 (×4): 40 mg via ORAL
  Filled 2022-05-05 (×4): qty 1

## 2022-05-05 MED ORDER — HYDROMORPHONE HCL 1 MG/ML IJ SOLN
0.5000 mg | INTRAMUSCULAR | Status: DC | PRN
  Administered 2022-05-06 – 2022-05-07 (×2): 1 mg via INTRAVENOUS
  Filled 2022-05-05 (×3): qty 1

## 2022-05-05 MED ORDER — SODIUM CHLORIDE 0.9 % IV SOLN: INTRAVENOUS | Status: DC

## 2022-05-05 MED ORDER — EPHEDRINE SULFATE (PRESSORS) 50 MG/ML IJ SOLN
INTRAMUSCULAR | Status: DC | PRN
  Administered 2022-05-05: 5 mg via INTRAVENOUS

## 2022-05-05 MED ORDER — FENTANYL CITRATE (PF) 100 MCG/2ML IJ SOLN
INTRAMUSCULAR | Status: AC
  Filled 2022-05-05: qty 2

## 2022-05-05 MED ORDER — SODIUM CHLORIDE 0.9 % IR SOLN
Status: DC | PRN
  Administered 2022-05-05: 1000 mL

## 2022-05-05 MED ORDER — METHOCARBAMOL 500 MG PO TABS
500.0000 mg | ORAL_TABLET | Freq: Four times a day (QID) | ORAL | Status: DC | PRN
  Administered 2022-05-06 – 2022-05-08 (×3): 500 mg via ORAL
  Filled 2022-05-05 (×4): qty 1

## 2022-05-05 MED ORDER — ONDANSETRON HCL 4 MG/2ML IJ SOLN
INTRAMUSCULAR | Status: DC | PRN
  Administered 2022-05-05: 4 mg via INTRAVENOUS

## 2022-05-05 MED ORDER — METHOCARBAMOL 500 MG IVPB - SIMPLE MED: 500.0000 mg | Freq: Four times a day (QID) | INTRAVENOUS | Status: DC | PRN

## 2022-05-05 MED ORDER — BUPIVACAINE HCL (PF) 0.25 % IJ SOLN
INTRAMUSCULAR | Status: DC | PRN
  Administered 2022-05-05: 30 mL

## 2022-05-05 MED ORDER — ACETAMINOPHEN 10 MG/ML IV SOLN: 1000.0000 mg | Freq: Once | INTRAVENOUS | Status: DC | PRN

## 2022-05-05 MED ORDER — FENTANYL CITRATE PF 50 MCG/ML IJ SOSY
50.0000 ug | PREFILLED_SYRINGE | INTRAMUSCULAR | Status: DC
  Administered 2022-05-05: 50 ug via INTRAVENOUS
  Filled 2022-05-05: qty 2

## 2022-05-05 MED ORDER — DEXAMETHASONE SODIUM PHOSPHATE 10 MG/ML IJ SOLN
INTRAMUSCULAR | Status: DC | PRN
  Administered 2022-05-05: 8 mg via INTRAVENOUS

## 2022-05-05 MED ORDER — ORAL CARE MOUTH RINSE: 15.0000 mL | Freq: Once | OROMUCOSAL | Status: AC

## 2022-05-05 MED ORDER — MIDAZOLAM HCL 2 MG/2ML IJ SOLN
1.0000 mg | INTRAMUSCULAR | Status: DC
  Filled 2022-05-05: qty 2

## 2022-05-05 MED ORDER — ONDANSETRON HCL 4 MG/2ML IJ SOLN: 4.0000 mg | Freq: Once | INTRAMUSCULAR | Status: DC | PRN

## 2022-05-05 MED ORDER — MENTHOL 3 MG MT LOZG: 1.0000 | LOZENGE | OROMUCOSAL | Status: DC | PRN

## 2022-05-05 MED ORDER — ASPIRIN 81 MG PO CHEW
81.0000 mg | CHEWABLE_TABLET | Freq: Two times a day (BID) | ORAL | Status: DC
  Administered 2022-05-05 – 2022-05-08 (×6): 81 mg via ORAL
  Filled 2022-05-05 (×6): qty 1

## 2022-05-05 MED ORDER — METOCLOPRAMIDE HCL 5 MG PO TABS: 5.0000 mg | ORAL_TABLET | Freq: Three times a day (TID) | ORAL | Status: DC | PRN

## 2022-05-05 MED ORDER — PHENYLEPHRINE HCL-NACL 20-0.9 MG/250ML-% IV SOLN
INTRAVENOUS | Status: DC | PRN
  Administered 2022-05-05: 40 ug/min via INTRAVENOUS

## 2022-05-05 MED ORDER — ROPIVACAINE HCL 7.5 MG/ML IJ SOLN
INTRAMUSCULAR | Status: DC | PRN
  Administered 2022-05-05: 20 mL via PERINEURAL

## 2022-05-05 MED ORDER — DEXAMETHASONE SODIUM PHOSPHATE 10 MG/ML IJ SOLN
INTRAMUSCULAR | Status: AC
  Filled 2022-05-05: qty 1

## 2022-05-05 MED ORDER — ONDANSETRON HCL 4 MG/2ML IJ SOLN
4.0000 mg | Freq: Four times a day (QID) | INTRAMUSCULAR | Status: DC | PRN
  Administered 2022-05-06: 4 mg via INTRAVENOUS
  Filled 2022-05-05: qty 2

## 2022-05-05 MED ORDER — MIDAZOLAM HCL 2 MG/2ML IJ SOLN
INTRAMUSCULAR | Status: AC
  Filled 2022-05-05: qty 2

## 2022-05-05 MED ORDER — BUPIVACAINE HCL 0.25 % IJ SOLN
INTRAMUSCULAR | Status: AC
  Filled 2022-05-05: qty 1

## 2022-05-05 SURGICAL SUPPLY — 53 items
BAG ZIPLOCK 12X15 (MISCELLANEOUS) ×1 IMPLANT
BENZOIN TINCTURE PRP APPL 2/3 (GAUZE/BANDAGES/DRESSINGS) IMPLANT
BLADE SAG 18X100X1.27 (BLADE) ×1 IMPLANT
BLADE SURG SZ10 CARB STEEL (BLADE) ×2 IMPLANT
BNDG ELASTIC 4X5.8 VLCR STR LF (GAUZE/BANDAGES/DRESSINGS) IMPLANT
BOWL SMART MIX CTS (DISPOSABLE) IMPLANT
CEMENT BONE R 1X40 (Cement) IMPLANT
CEMENT BONE SIMPLEX SPEEDSET (Cement) IMPLANT
COOLER ICEMAN CLASSIC (MISCELLANEOUS) ×1 IMPLANT
COVER SURGICAL LIGHT HANDLE (MISCELLANEOUS) ×1 IMPLANT
CUFF TOURN SGL QUICK 34 (TOURNIQUET CUFF) ×1
CUFF TRNQT CYL 34X4.125X (TOURNIQUET CUFF) ×1 IMPLANT
DRAPE INCISE IOBAN 66X45 STRL (DRAPES) ×1 IMPLANT
DRAPE U-SHAPE 47X51 STRL (DRAPES) ×1 IMPLANT
DURAPREP 26ML APPLICATOR (WOUND CARE) ×1 IMPLANT
ELECT REM PT RETURN 15FT ADLT (MISCELLANEOUS) ×1 IMPLANT
FEMUR CMT CR STD SZ 6 LT KNEE (Joint) ×1 IMPLANT
FEMUR CMTD CR STD SZ 6 LT KNEE (Joint) IMPLANT
GAUZE PAD ABD 8X10 STRL (GAUZE/BANDAGES/DRESSINGS) ×2 IMPLANT
GAUZE SPONGE 4X4 12PLY STRL (GAUZE/BANDAGES/DRESSINGS) ×1 IMPLANT
GAUZE XEROFORM 1X8 LF (GAUZE/BANDAGES/DRESSINGS) IMPLANT
GLOVE BIO SURGEON STRL SZ7.5 (GLOVE) ×1 IMPLANT
GLOVE BIOGEL PI IND STRL 8 (GLOVE) ×2 IMPLANT
GLOVE ECLIPSE 8.0 STRL XLNG CF (GLOVE) ×1 IMPLANT
GOWN STRL REUS W/ TWL XL LVL3 (GOWN DISPOSABLE) ×2 IMPLANT
GOWN STRL REUS W/TWL XL LVL3 (GOWN DISPOSABLE) ×2
HANDPIECE INTERPULSE COAX TIP (DISPOSABLE) ×1
HDLS TROCR DRIL PIN KNEE 75 (PIN) ×2
HOLDER FOLEY CATH W/STRAP (MISCELLANEOUS) IMPLANT
KIT TURNOVER KIT A (KITS) IMPLANT
LINER TIB ASF PS CD/6-7 16 LT (Liner) IMPLANT
NS IRRIG 1000ML POUR BTL (IV SOLUTION) ×1 IMPLANT
PACK TOTAL KNEE CUSTOM (KITS) ×1 IMPLANT
PAD COLD SHLDR WRAP-ON (PAD) ×1 IMPLANT
PADDING CAST COTTON 6X4 STRL (CAST SUPPLIES) ×2 IMPLANT
PIN DRILL HDLS TROCAR 75 4PK (PIN) IMPLANT
PROTECTOR NERVE ULNAR (MISCELLANEOUS) ×1 IMPLANT
SCREW FEMALE HEX FIX 25X2.5 (ORTHOPEDIC DISPOSABLE SUPPLIES) IMPLANT
SET HNDPC FAN SPRY TIP SCT (DISPOSABLE) ×1 IMPLANT
SET PAD KNEE POSITIONER (MISCELLANEOUS) ×1 IMPLANT
STAPLER VISISTAT 35W (STAPLE) IMPLANT
STEM POLY PAT PLY 29M KNEE (Knees) IMPLANT
STEM TIBIA 5 DEG SZ D L KNEE (Knees) IMPLANT
STRIP CLOSURE SKIN 1/2X4 (GAUZE/BANDAGES/DRESSINGS) IMPLANT
SUT MNCRL AB 4-0 PS2 18 (SUTURE) IMPLANT
SUT VIC AB 0 CT1 27 (SUTURE) ×1
SUT VIC AB 0 CT1 27XBRD ANTBC (SUTURE) ×1 IMPLANT
SUT VIC AB 1 CT1 36 (SUTURE) ×2 IMPLANT
SUT VIC AB 2-0 CT1 27 (SUTURE) ×2
SUT VIC AB 2-0 CT1 TAPERPNT 27 (SUTURE) ×2 IMPLANT
TIBIA STEM 5 DEG SZ D L KNEE (Knees) ×1 IMPLANT
TRAY FOLEY MTR SLVR 16FR STAT (SET/KITS/TRAYS/PACK) IMPLANT
WATER STERILE IRR 1000ML POUR (IV SOLUTION) ×2 IMPLANT

## 2022-05-05 NOTE — Transfer of Care (Signed)
Immediate Anesthesia Transfer of Care Note  Patient: Michelle Foster  Procedure(s) Performed: LEFT TOTAL KNEE ARTHROPLASTY (Left: Knee)  Patient Location: PACU  Anesthesia Type:Spinal  Level of Consciousness: awake, alert , oriented, and patient cooperative  Airway & Oxygen Therapy: Patient Spontanous Breathing and Patient connected to face mask oxygen  Post-op Assessment: Report given to RN and Post -op Vital signs reviewed and stable  Post vital signs: Reviewed and stable  Last Vitals:  Vitals Value Taken Time  BP 110/71 05/05/22 1403  Temp    Pulse 77 05/05/22 1404  Resp 13 05/05/22 1404  SpO2 100 % 05/05/22 1404  Vitals shown include unvalidated device data.  Last Pain:  Vitals:   05/05/22 1145  TempSrc:   PainSc: 0-No pain         Complications: No notable events documented.

## 2022-05-05 NOTE — Anesthesia Postprocedure Evaluation (Signed)
Anesthesia Post Note  Patient: Michelle Foster  Procedure(s) Performed: LEFT TOTAL KNEE ARTHROPLASTY (Left: Knee)     Patient location during evaluation: PACU Anesthesia Type: Spinal Level of consciousness: oriented and awake and alert Pain management: pain level controlled Vital Signs Assessment: post-procedure vital signs reviewed and stable Respiratory status: spontaneous breathing, respiratory function stable and patient connected to nasal cannula oxygen Cardiovascular status: blood pressure returned to baseline and stable Postop Assessment: no headache, no backache and no apparent nausea or vomiting Anesthetic complications: no  No notable events documented.  Last Vitals:  Vitals:   05/05/22 1403 05/05/22 1415  BP: 110/71 116/65  Pulse: 82 73  Resp: 18 12  Temp: 36.4 C   SpO2: 100% 100%    Last Pain:  Vitals:   05/05/22 1430  TempSrc:   PainSc: 0-No pain    LLE Motor Response: Purposeful movement (05/05/22 1430) LLE Sensation: Decreased (05/05/22 1430) RLE Motor Response: Purposeful movement (05/05/22 1430) RLE Sensation: Decreased (05/05/22 1430) L Sensory Level: L3-Anterior knee, lower leg (05/05/22 1430) R Sensory Level: L3-Anterior knee, lower leg (05/05/22 1430)  Zamariya Neal S

## 2022-05-05 NOTE — Evaluation (Signed)
Physical Therapy Evaluation Patient Details [name] MRN: [id number] DOB: Nov 08, 1950 Today's Date: 05/05/2022  History of Present Illness  Pt s/p L TKR  Clinical Impression  Pt s/p L TKR and presents with decreased L LE strength/ROM and post op pain limiting  functional mobility.  Pt should progress to dc home with family assist.     Recommendations for follow up therapy are one component of a multi-disciplinary discharge planning process, led by the attending physician.  Recommendations may be updated based on patient status, additional functional criteria and insurance authorization.  Follow Up Recommendations Follow physician's recommendations for discharge plan and follow up therapies      Assistance Recommended at Discharge Frequent or constant Supervision/Assistance  Patient can return home with the following  A lot of help with walking and/or transfers;A little help with bathing/dressing/bathroom;Assistance with cooking/housework;Assist for transportation;Help with stairs or ramp for entrance    Equipment Recommendations Rolling walker (2 wheels)  Recommendations for Other Services       Functional Status Assessment Patient has had a recent decline in their functional status and demonstrates the ability to make significant improvements in function in a reasonable and predictable amount of time.     Precautions / Restrictions Precautions Precautions: Knee;Fall Required Braces or Orthoses: Knee Immobilizer - Left Knee Immobilizer - Left: Discontinue once straight leg raise with < 10 degree lag Restrictions Weight Bearing Restrictions: No Other Position/Activity Restrictions: WBAT      Mobility  Bed Mobility Overal bed mobility: Needs Assistance Bed Mobility: Supine to Sit, Sit to Supine     Supine to sit: Min assist, Mod assist Sit to supine: Min assist, Mod assist   General bed mobility comments: cues for sequence with assist to manage L LE and to  bring trunk to upright    Transfers                   General transfer comment: NT 2* weakness in quads and pt report of decreased sensation in buttocks with sitting    Ambulation/Gait               General Gait Details: Pt to EOB sitting x 15 min only - standing/ambulation deferred 2* residual spinal/quad weakness  Stairs            Wheelchair Mobility    Modified Rankin (Stroke Patients Only)       Balance Overall balance assessment: Needs assistance Sitting-balance support: Feet supported, No upper extremity supported Sitting balance-Leahy Scale: Fair                                       Pertinent Vitals/Pain Pain Assessment Pain Assessment: 0-10    Home Living Family/patient expects to be discharged to:: Private residence Living Arrangements: Spouse/significant other Available Help at Discharge: Available 24 hours/day Type of Home: House Home Access: Stairs to enter Entrance Stairs-Rails: None Entrance Stairs-Number of Steps: 1   Home Layout: One level Home Equipment: Cane - single point;Grab bars - tub/shower      Prior Function Prior Level of Function : Independent/Modified Independent             Mobility Comments: using cane as needed       Hand Dominance   Dominant Hand: Right    Extremity/Trunk Assessment   Upper Extremity Assessment Upper Extremity Assessment: Overall WFL for tasks assessed    Lower  Extremity Assessment Lower Extremity Assessment: RLE deficits/detail    Cervical / Trunk Assessment Cervical / Trunk Assessment: Normal  Communication   Communication: No difficulties  Cognition Arousal/Alertness: Awake/alert Behavior During Therapy: WFL for tasks assessed/performed Overall Cognitive Status: Within Functional Limits for tasks assessed                                          General Comments      Exercises Total Joint Exercises Ankle Circles/Pumps: AROM,  Both, 15 reps, Supine   Assessment/Plan    PT Assessment Patient needs continued PT services  PT Problem List Decreased strength;Decreased range of motion;Decreased activity tolerance;Decreased balance;Decreased mobility;Pain;Decreased knowledge of use of DME       PT Treatment Interventions DME instruction;Gait training;Stair training;Functional mobility training;Therapeutic activities;Therapeutic exercise;Patient/family education    PT Goals (Current goals can be found in the Care Plan section)  Acute Rehab PT Goals Patient Stated Goal: REgain PT Goal Formulation: With patient Time For Goal Achievement: 05/12/22 Potential to Achieve Goals: Good    Frequency 7X/week     Co-evaluation               AM-PAC PT "6 Clicks" Mobility  Outcome Measure Help needed turning from your back to your side while in a flat bed without using bedrails?: A Little Help needed moving from lying on your back to sitting on the side of a flat bed without using bedrails?: A Little Help needed moving to and from a bed to a chair (including a wheelchair)?: A Lot Help needed standing up from a chair using your arms (e.g., wheelchair or bedside chair)?: A Lot Help needed to walk in hospital room?: Total Help needed climbing 3-5 steps with a railing? : Total 6 Click Score: 12    End of Session   Activity Tolerance: Patient tolerated treatment well Patient left: in bed;with call bell/phone within reach;with bed alarm set Nurse Communication: Mobility status PT Visit Diagnosis: Difficulty in walking, not elsewhere classified (R26.2)    Time: [id number] PT Time Calculation (min) (ACUTE ONLY): 29 min   Charges:   PT Evaluation $PT Eval Low Complexity: 1 Low PT Treatments $Therapeutic Activity: 8-22 mins        Debe Coder PT Acute Rehabilitation Services Pager [phone] Office [phone]   Elianna Windom 05/05/2022, 6:10 PM

## 2022-05-05 NOTE — Plan of Care (Signed)
  Problem: Education: Goal: Knowledge of the prescribed therapeutic regimen will improve Outcome: Progressing   Problem: Activity: Goal: Ability to avoid complications of mobility impairment will improve Outcome: Progressing   Problem: Pain Management: Goal: Pain level will decrease with appropriate interventions Outcome: Progressing   Problem: Skin Integrity: Goal: Will show signs of wound healing Outcome: Progressing   Problem: Education: Goal: Knowledge of General Education information will improve Description: Including pain rating scale, medication(s)/side effects and non-pharmacologic comfort measures Outcome: Progressing   Problem: Activity: Goal: Risk for activity intolerance will decrease Outcome: Progressing   Problem: Nutrition: Goal: Adequate nutrition will be maintained Outcome: Progressing   Problem: Elimination: Goal: Will not experience complications related to urinary retention Outcome: Progressing   Problem: Pain Managment: Goal: General experience of comfort will improve Outcome: Progressing

## 2022-05-05 NOTE — Interval H&P Note (Signed)
History and Physical Interval Note: The patient understands that she is here today for a left knee replacement to treat her left knee osteoarthritis.  There has been no acute or interval change in her medical status.  See recent H&P.  The risks and benefits of surgery have been explained in detail and informed consent is obtained.  The left operative knee has been marked.  05/05/2022 10:24 AM  Edman Circle Quinones  has presented today for surgery, with the diagnosis of osteoarthritis left knee.  The various methods of treatment have been discussed with the patient and family. After consideration of risks, benefits and other options for treatment, the patient has consented to  Procedure(s): LEFT TOTAL KNEE ARTHROPLASTY (Left) as a surgical intervention.  The patient's history has been reviewed, patient examined, no change in status, stable for surgery.  I have reviewed the patient's chart and labs.  Questions were answered to the patient's satisfaction.     Mcarthur Rossetti

## 2022-05-05 NOTE — Anesthesia Procedure Notes (Signed)
Anesthesia Procedure Image    

## 2022-05-05 NOTE — Anesthesia Procedure Notes (Addendum)
Spinal  Patient location during procedure: OR Start time: 05/05/2022 12:02 PM End time: 05/05/2022 12:09 PM Reason for block: surgical anesthesia Staffing Performed: anesthesiologist  Anesthesiologist: Myrtie Soman, MD Performed by: Myrtie Soman, MD Authorized by: Myrtie Soman, MD   Preanesthetic Checklist Completed: patient identified, IV checked, site marked, risks and benefits discussed, surgical consent, monitors and equipment checked, pre-op evaluation and timeout performed Spinal Block Patient position: sitting Prep: Betadine Patient monitoring: heart rate, continuous pulse ox and blood pressure Approach: midline Location: L3-4 Injection technique: single-shot Needle Needle type: Sprotte  Needle gauge: 24 G Needle length: 9 cm Assessment Sensory level: T6 Events: CSF return and second provider Additional Notes

## 2022-05-05 NOTE — Anesthesia Preprocedure Evaluation (Signed)
Anesthesia Evaluation  Patient identified by MRN, date of birth, ID band Patient awake    Reviewed: Allergy & Precautions, H&P , NPO status , Patient's Chart, lab work & pertinent test results  Airway Mallampati: II  TM Distance: >3 FB Neck ROM: Full    Dental no notable dental hx.    Pulmonary neg pulmonary ROS   Pulmonary exam normal breath sounds clear to auscultation       Cardiovascular negative cardio ROS Normal cardiovascular exam Rhythm:Regular Rate:Normal     Neuro/Psych negative neurological ROS  negative psych ROS   GI/Hepatic negative GI ROS, Neg liver ROS,,,  Endo/Other  negative endocrine ROS    Renal/GU negative Renal ROS  negative genitourinary   Musculoskeletal  (+) Arthritis , Osteoarthritis,    Abdominal   Peds negative pediatric ROS (+)  Hematology negative hematology ROS (+)   Anesthesia Other Findings   Reproductive/Obstetrics negative OB ROS                             Anesthesia Physical Anesthesia Plan  ASA: 2  Anesthesia Plan: Spinal   Post-op Pain Management: Regional block*   Induction: Intravenous  PONV Risk Score and Plan: 2 and Ondansetron, Dexamethasone, Propofol infusion and Treatment may vary due to age or medical condition  Airway Management Planned: Simple Face Mask  Additional Equipment:   Intra-op Plan:   Post-operative Plan:   Informed Consent: I have reviewed the patients History and Physical, chart, labs and discussed the procedure including the risks, benefits and alternatives for the proposed anesthesia with the patient or authorized representative who has indicated his/her understanding and acceptance.     Dental advisory given  Plan Discussed with: CRNA and Surgeon  Anesthesia Plan Comments:        Anesthesia Quick Evaluation

## 2022-05-05 NOTE — Op Note (Signed)
Operative Note  Date of operation: 05/05/2022 Preoperative diagnosis: Left knee primary osteoarthritis Postoperative diagnosis: Same  Procedure: Left cemented total knee arthroplasty  Implants: Biomet/Zimmer persona knee system with size 6 left standard CR femur, size D left tibial tray, 60 mm medial congruent left fixed-bearing polythene insert, 29 mm patella button  Surgeon: Lind Guest. Ninfa Linden, MD Assistant: Benita Stabile, PA-C  Anesthesia: #1 left lower extremity adductor canal block, #2 spinal, #3 local Tourniquet time: Just under 1 hour EBL: Under 100 cc Antibiotics: IV Ancef Complications: None  Indications: The patient is a 72 year old female with debilitating arthritis involving her left knee.  She has tried and failed all forms of conservative treatment and even an arthroscopic intervention by another orthopedic surgeon in October last year had no help for her knee.  She has significant subchondral edema around the medial compartment of the knee as well as areas of full-thickness cartilage loss and chronic meniscal tearing.  She also has cartilage loss at the patellofemoral joint.  Due to her continued pain and the detrimental effect this has had on her quality of life, her mobility and her actives daily living we are recommending a total knee arthroplasty.  We had a long thorough discussion about the risk of acute blood loss anemia, nerve or vessel injury, fracture, infection, DVT, implant failure and wound healing issues.  She understands hopefully her goals are decreased pain, improve mobility and improve quality of life.  Procedure description: After informed consent was obtained and the appropriate left knee was marked, anesthesia obtained a left lower extremity adductor canal block in the holding room.  The patient was then brought to the operating room and set up on the operating table where spinal anesthesia was obtained.  She was then laid in supine position on the  operating table and a Foley catheter was placed.  A nonsterile tourniquet is placed around her upper left thigh and her left thigh, knee, leg and ankle were prepped and draped with DuraPrep and sterile drapes including a sterile stockinette.  A timeout was called and she was identified as the correct patient the correct left knee.  An Esmarch was then used to wrap out the leg and the tourniquet was inflated to 300 mm of pressure.  With the knee extended a direct midline incision was made over the patella and carried proximally distally.  Dissection was carried down to the knee joint and a medial parapatellar arthrotomy was made finding a mild effusion.  There was definitely synovitis in the knee and we remove synovium from the anterior/superior compartment of the knee.  We also removed osteophytes from all 3 compartments and remnants of the ACL as well as medial lateral meniscus.  With the knee in a flexed position we set her extramedullary cutting guide for making her proximal tibia cut correction for varus valgus and a 3 degree slope.  We made this cut to take 2 mm off the low side.  We made the cut without difficulty.  We then went to the femur and used an intramedullary cutting guide for making her distal femoral cut setting this for left knee at 5 degrees externally rotated we made that cut without difficulty and brought the knee back down to full extension and she actually hyperextended with a 10 mm block and closer to past the 12 mm block she felt like it was more stable.  We then went back to the femoral sizing guide with the knee in a flexed position and chose a  size 6 femur.  We put a 4-in-1 cutting block for size 6 femur and made her anterior posterior cuts followed our chamfer cuts.  Of note the femoral sizing guide was based off the epicondylar axis.  We then went back to the tibia and chose a size D tibial tray for a left knee setting the rotation off of the tibial tubercle and femur.  We like the  position of the coverage over the tibial plateau.  We then did our drill punch and keel hole for this.  Next we trialed our size D left tibia followed by our size 6 left CR standard femur.  We went all the way to a 16 mm medial congruent polythene insert and I was pleased with range of motion and stability without insert.  We then made a patella cut and drilled 3 holes for size 29 patella button.  With all transportation the knee we put her through several cycles of motion we are pleased with range of motion and stability.  We then removed all transportation from the knee and irrigate the knee with normal saline solution using pulsatile lavage.  We dried the knee well and then placed our Marcaine with epinephrine around the arthrotomy.  With the knee in a flexed position we then mixed our cement and cemented our Biomet Zimmer persona tibial tray for a left knee size D followed by our size 6 left CR standard femur.  We placed our 16 mm medial congruent left polythene insert and cemented our size 29 patella button.  We then held the knee extended and compressed for the cement to harden.  Once it hardened with the tourniquet down and hemostasis was obtained with electrocautery.  The arthrotomy was then closed with interrupted #1 Vicryl suture followed by 0 Vicryl's deep tissue and 2-0 Vicryl close subcutaneous tissue.  The skin was closed with staples.  Well-padded sterile dressings applied.  The patient was taken to the recovery room with all final counts being correct and no complications noted.  Benita Stabile, PA-C did assist during the entire case from beginning to end and his assistance was medically necessary and crucial for soft tissue management and retraction, helping guide implant placement and a layered closure of the wound.

## 2022-05-05 NOTE — Anesthesia Procedure Notes (Signed)
Anesthesia Regional Block: Adductor canal block   Pre-Anesthetic Checklist: , timeout performed,  Correct Patient, Correct Site, Correct Laterality,  Correct Procedure, Correct Position, site marked,  Risks and benefits discussed,  Surgical consent,  Pre-op evaluation,  At surgeon's request and post-op pain management  Laterality: Left  Prep: chloraprep       Needles:  Injection technique: Single-shot  Needle Type: Echogenic Needle     Needle Length: 9cm      Additional Needles:   Procedures:,,,, ultrasound used (permanent image in chart),,    Narrative:  Start time: 05/05/2022 11:38 AM End time: 05/05/2022 11:42 AM Injection made incrementally with aspirations every 5 mL.  Performed by: Personally  Anesthesiologist: Myrtie Soman, MD  Additional Notes: Patient tolerated the procedure well without complications

## 2022-05-06 DIAGNOSIS — M1712 Unilateral primary osteoarthritis, left knee: Secondary | ICD-10-CM | POA: Diagnosis not present

## 2022-05-06 LAB — BASIC METABOLIC PANEL
Anion gap: 8 (ref 5–15)
BUN: 12 mg/dL (ref 8–23)
CO2: 23 mmol/L (ref 22–32)
Calcium: 9.4 mg/dL (ref 8.9–10.3)
Chloride: 105 mmol/L (ref 98–111)
Creatinine, Ser: 0.88 mg/dL (ref 0.44–1.00)
GFR, Estimated: >60 mL/min (ref >60)
Glucose, Bld: 127 mg/dL — ABNORMAL HIGH (ref 70–99)
Potassium: 4.5 mmol/L (ref 3.5–5.1)
Sodium: 136 mmol/L (ref 135–145)

## 2022-05-06 LAB — CBC
HCT: 33.8 % — ABNORMAL LOW (ref 36.0–46.0)
Hemoglobin: 10.9 g/dL — ABNORMAL LOW (ref 12.0–15.0)
MCH: 31.7 pg (ref 26.0–34.0)
MCHC: 32.2 g/dL (ref 30.0–36.0)
MCV: 98.3 fL (ref 80.0–100.0)
Platelets: 289 10*3/uL (ref 150–400)
RBC: 3.44 MIL/uL — ABNORMAL LOW (ref 3.87–5.11)
RDW: 12.7 % (ref 11.5–15.5)
WBC: 15 10*3/uL — ABNORMAL HIGH (ref 4.0–10.5)
nRBC: 0 % (ref 0.0–0.2)

## 2022-05-06 MED ORDER — ASPIRIN 81 MG PO CHEW: 81.0000 mg | CHEWABLE_TABLET | Freq: Two times a day (BID) | ORAL | 0 refills | Status: DC

## 2022-05-06 MED ORDER — METHOCARBAMOL 500 MG PO TABS: 500.0000 mg | ORAL_TABLET | Freq: Four times a day (QID) | ORAL | 1 refills | Status: DC | PRN

## 2022-05-06 MED ORDER — OXYCODONE HCL 5 MG PO TABS: 5.0000 mg | ORAL_TABLET | ORAL | 0 refills | Status: DC | PRN

## 2022-05-06 NOTE — Progress Notes (Signed)
Subjective: 1 Day Post-Op Procedure(s) (LRB): LEFT TOTAL KNEE ARTHROPLASTY (Left) Patient reports pain as moderate.  PT recommends patient stay another day due to significant quad weakness, and I agree with this given fall risk.  Objective: Vital signs in last 24 hours: Temp:  [97.6 F (36.4 C)-98.1 F (36.7 C)] 98.1 F (36.7 C) (02/17 0940) Pulse Rate:  [60-82] 72 (02/17 0940) Resp:  [11-20] 17 (02/17 0940) BP: (101-143)/(59-97) 116/59 (02/17 0940) SpO2:  [97 %-100 %] 100 % (02/17 0940)  Intake/Output from previous day: 02/16 0701 - 02/17 0700 In: 3774.8 [P.O.:1200; I.V.:2274.8; IV Piggyback:300] Out: 2860 [Urine:2810; Blood:50] Intake/Output this shift: No intake/output data recorded.  Recent Labs    05/06/22 0321  HGB 10.9*   Recent Labs    05/06/22 0321  WBC 15.0*  RBC 3.44*  HCT 33.8*  PLT 289   Recent Labs    05/06/22 0321  NA 136  K 4.5  CL 105  CO2 23  BUN 12  CREATININE 0.88  GLUCOSE 127*  CALCIUM 9.4   No results for input(s): "LABPT", "INR" in the last 72 hours.  Sensation intact distally Intact pulses distally Dorsiflexion/Plantar flexion intact Incision: dressing C/D/I Compartment soft   Assessment/Plan: 1 Day Post-Op Procedure(s) (LRB): LEFT TOTAL KNEE ARTHROPLASTY (Left) Up with therapy Plan for discharge tomorrow Discharge home with home health      Mcarthur Rossetti 05/06/2022, 11:34 AM

## 2022-05-06 NOTE — Progress Notes (Signed)
Physical Therapy Treatment Patient Details [name] MRN: [id number] DOB: 04/01/1950 Today's Date: 05/06/2022   History of Present Illness Pt s/p L TKR    PT Comments    Pt agreeable to bed level therex only this pm.  Pt states in bathroom a short time ago and experienced knee buckling and near fall - pt using RW and with KI in place.   Recommendations for follow up therapy are one component of a multi-disciplinary discharge planning process, led by the attending physician.  Recommendations may be updated based on patient status, additional functional criteria and insurance authorization.  Follow Up Recommendations  Follow physician's recommendations for discharge plan and follow up therapies     Assistance Recommended at Discharge Frequent or constant Supervision/Assistance  Patient can return home with the following A little help with walking and/or transfers;A little help with bathing/dressing/bathroom;Assistance with cooking/housework;Assist for transportation;Help with stairs or ramp for entrance   Equipment Recommendations  Rolling walker (2 wheels)    Recommendations for Other Services       Precautions / Restrictions Precautions Precautions: Knee;Fall Required Braces or Orthoses: Knee Immobilizer - Left Knee Immobilizer - Left: Discontinue once straight leg raise with < 10 degree lag Restrictions Weight Bearing Restrictions: No Other Position/Activity Restrictions: WBAT     Mobility  Bed Mobility Overal bed mobility: Needs Assistance Bed Mobility: Supine to Sit     Supine to sit: Min assist     General bed mobility comments: cues for sequence with assist to manage L LE    Transfers Overall transfer level: Needs assistance Equipment used: Rolling walker (2 wheels) Transfers: Sit to/from Stand Sit to Stand: Min assist           General transfer comment: cues for LE management and use of UEs to self assist.  Physical assist to bring wt up  and fwd and to balance in initial standing    Ambulation/Gait Ambulation/Gait assistance: Min assist Gait Distance (Feet): 74 Feet Assistive device: Rolling walker (2 wheels) Gait Pattern/deviations: Step-to pattern, Decreased step length - right, Decreased step length - left, Shuffle, Antalgic, Trunk flexed Gait velocity: decr     General Gait Details: cues for sequence, posture and position from [employer]             Wheelchair Mobility    Modified Rankin (Stroke Patients Only)       Balance Overall balance assessment: Needs assistance Sitting-balance support: Feet supported, No upper extremity supported Sitting balance-Leahy Scale: Good     Standing balance support: No upper extremity supported Standing balance-Leahy Scale: Fair                              Cognition Arousal/Alertness: Awake/alert Behavior During Therapy: WFL for tasks assessed/performed Overall Cognitive Status: Within Functional Limits for tasks assessed                                          Exercises Total Joint Exercises Ankle Circles/Pumps: AROM, Both, 15 reps, Supine Quad Sets: AROM, Both, Supine, 15 reps Heel Slides: AAROM, Left, Supine, 20 reps Straight Leg Raises: AAROM, Left, Supine, 15 reps Goniometric ROM: AAROM R knee -8 - 85    General Comments        Pertinent Vitals/Pain Pain Assessment Pain Assessment: 0-10 Pain Score: 6  Pain  Location: R knee Pain Descriptors / Indicators: Aching, Sore Pain Intervention(s): Limited activity within patient's tolerance, Monitored during session, Patient requesting pain meds-RN notified, RN gave pain meds during session, Ice applied    Home Living                          Prior Function            PT Goals (current goals can now be found in the care plan section) Acute Rehab PT Goals Patient Stated Goal: REgain PT Goal Formulation: With patient Time For Goal Achievement:  05/12/22 Potential to Achieve Goals: Good Progress towards PT goals: Progressing toward goals    Frequency    7X/week      PT Plan Current plan remains appropriate    Co-evaluation              AM-PAC PT "6 Clicks" Mobility   Outcome Measure  Help needed turning from your back to your side while in a flat bed without using bedrails?: A Little Help needed moving from lying on your back to sitting on the side of a flat bed without using bedrails?: A Little Help needed moving to and from a bed to a chair (including a wheelchair)?: A Little Help needed standing up from a chair using your arms (e.g., wheelchair or bedside chair)?: A Little Help needed to walk in hospital room?: A Little Help needed climbing 3-5 steps with a railing? : A Lot 6 Click Score: 17    End of Session Equipment Utilized During Treatment: Gait belt;Right knee immobilizer Activity Tolerance: Patient tolerated treatment well Patient left: in bed;with call bell/phone within reach;with family/visitor present Nurse Communication: Mobility status PT Visit Diagnosis: Difficulty in walking, not elsewhere classified (R26.2)     Time: [id number] PT Time Calculation (min) (ACUTE ONLY): 22 min  Charges:  $Gait Training: 8-22 mins $Therapeutic Exercise: 8-22 mins                     Debe Coder PT Acute Rehabilitation Services Pager [phone] Office [phone]    Khayree Delellis 05/06/2022, 2:32 PM

## 2022-05-06 NOTE — Progress Notes (Signed)
Physical Therapy Treatment Patient Details [name] MRN: [id number] DOB: 03/16/1951 Today's Date: 05/06/2022   History of Present Illness Pt s/p L TKR    PT Comments    Pt very motivated and eager to progress.  Pt performed therex program with assist but OOB deferred pending arrival of KI 2* very limited quad strength.   Recommendations for follow up therapy are one component of a multi-disciplinary discharge planning process, led by the attending physician.  Recommendations may be updated based on patient status, additional functional criteria and insurance authorization.  Follow Up Recommendations  Follow physician's recommendations for discharge plan and follow up therapies     Assistance Recommended at Discharge Frequent or constant Supervision/Assistance  Patient can return home with the following A little help with walking and/or transfers;A little help with bathing/dressing/bathroom;Assistance with cooking/housework;Assist for transportation;Help with stairs or ramp for entrance   Equipment Recommendations  Rolling walker (2 wheels)    Recommendations for Other Services       Precautions / Restrictions Precautions Precautions: Knee;Fall Required Braces or Orthoses: Knee Immobilizer - Left Knee Immobilizer - Left: Discontinue once straight leg raise with < 10 degree lag Restrictions Weight Bearing Restrictions: No Other Position/Activity Restrictions: WBAT     Mobility  Bed Mobility                    Transfers                        Ambulation/Gait                   Stairs             Wheelchair Mobility    Modified Rankin (Stroke Patients Only)       Balance                                            Cognition Arousal/Alertness: Awake/alert Behavior During Therapy: WFL for tasks assessed/performed Overall Cognitive Status: Within Functional Limits for tasks assessed                                           Exercises Total Joint Exercises Ankle Circles/Pumps: AROM, Both, 15 reps, Supine Quad Sets: AROM, Both, 10 reps, Supine Heel Slides: AAROM, Left, 15 reps, Supine Straight Leg Raises: AAROM, Left, 10 reps, Supine Goniometric ROM: AAROM R knee -10 - 70    General Comments        Pertinent Vitals/Pain Pain Assessment Pain Assessment: 0-10 Pain Score: 5  Pain Location: R knee Pain Descriptors / Indicators: Aching, Sore Pain Intervention(s): Limited activity within patient's tolerance, Monitored during session, Premedicated before session    Home Living                          Prior Function            PT Goals (current goals can now be found in the care plan section) Acute Rehab PT Goals Patient Stated Goal: REgain PT Goal Formulation: With patient Time For Goal Achievement: 05/12/22 Potential to Achieve Goals: Good Progress towards PT goals: Progressing toward goals    Frequency    7X/week  PT Plan Current plan remains appropriate    Co-evaluation              AM-PAC PT "6 Clicks" Mobility   Outcome Measure  Help needed turning from your back to your side while in a flat bed without using bedrails?: A Little Help needed moving from lying on your back to sitting on the side of a flat bed without using bedrails?: A Little Help needed moving to and from a bed to a chair (including a wheelchair)?: A Lot Help needed standing up from a chair using your arms (e.g., wheelchair or bedside chair)?: A Lot Help needed to walk in hospital room?: Total Help needed climbing 3-5 steps with a railing? : Total 6 Click Score: 12    End of Session Equipment Utilized During Treatment: Gait belt;Right knee immobilizer Activity Tolerance: Patient tolerated treatment well Patient left: in bed;with bed alarm set;with call bell/phone within reach Nurse Communication: Mobility status PT Visit Diagnosis: Difficulty in  walking, not elsewhere classified (R26.2)     Time: [id number] PT Time Calculation (min) (ACUTE ONLY): 16 min  Charges:  $Therapeutic Exercise: 8-22 mins                     [place] Pager [phone] Office [phone]    Tylesha Gibeault 05/06/2022, 10:58 AM

## 2022-05-06 NOTE — Progress Notes (Signed)
Physical Therapy Treatment Patient Details [name] MRN: [id number] DOB: 01-14-1951 Today's Date: 05/06/2022   History of Present Illness Pt s/p L TKR    PT Comments    Pt continues very motivated and up to ambulate in hall with RW, min assist, sequencing cues and KI in place.  Distance ltd by fatigue and increasing pain.   Recommendations for follow up therapy are one component of a multi-disciplinary discharge planning process, led by the attending physician.  Recommendations may be updated based on patient status, additional functional criteria and insurance authorization.  Follow Up Recommendations  Follow physician's recommendations for discharge plan and follow up therapies     Assistance Recommended at Discharge Frequent or constant Supervision/Assistance  Patient can return home with the following A little help with walking and/or transfers;A little help with bathing/dressing/bathroom;Assistance with cooking/housework;Assist for transportation;Help with stairs or ramp for entrance   Equipment Recommendations  Rolling walker (2 wheels)    Recommendations for Other Services       Precautions / Restrictions Precautions Precautions: Knee;Fall Required Braces or Orthoses: Knee Immobilizer - Left Knee Immobilizer - Left: Discontinue once straight leg raise with < 10 degree lag Restrictions Weight Bearing Restrictions: No Other Position/Activity Restrictions: WBAT     Mobility  Bed Mobility Overal bed mobility: Needs Assistance Bed Mobility: Supine to Sit     Supine to sit: Min assist     General bed mobility comments: cues for sequence with assist to manage L LE    Transfers Overall transfer level: Needs assistance Equipment used: Rolling walker (2 wheels) Transfers: Sit to/from Stand Sit to Stand: Min assist           General transfer comment: cues for LE management and use of UEs to self assist.  Physical assist to bring wt up and fwd and to  balance in initial standing    Ambulation/Gait Ambulation/Gait assistance: Min assist Gait Distance (Feet): 74 Feet Assistive device: Rolling walker (2 wheels) Gait Pattern/deviations: Step-to pattern, Decreased step length - right, Decreased step length - left, Shuffle, Antalgic, Trunk flexed Gait velocity: decr     General Gait Details: cues for sequence, posture and position from [employer]             Wheelchair Mobility    Modified Rankin (Stroke Patients Only)       Balance Overall balance assessment: Needs assistance Sitting-balance support: Feet supported, No upper extremity supported Sitting balance-Leahy Scale: Good     Standing balance support: No upper extremity supported Standing balance-Leahy Scale: Fair                              Cognition Arousal/Alertness: Awake/alert Behavior During Therapy: WFL for tasks assessed/performed Overall Cognitive Status: Within Functional Limits for tasks assessed                                          Exercises Total Joint Exercises Ankle Circles/Pumps: AROM, Both, 15 reps, Supine Quad Sets: AROM, Both, 10 reps, Supine Heel Slides: AAROM, Left, 15 reps, Supine Straight Leg Raises: AAROM, Left, 10 reps, Supine Goniometric ROM: AAROM R knee -10 - 70    General Comments        Pertinent Vitals/Pain Pain Assessment Pain Assessment: 0-10 Pain Score: 5  Pain Location: R knee Pain Descriptors /  Indicators: Aching, Sore Pain Intervention(s): Limited activity within patient's tolerance, Monitored during session, Premedicated before session    Home Living                          Prior Function            PT Goals (current goals can now be found in the care plan section) Acute Rehab PT Goals Patient Stated Goal: REgain PT Goal Formulation: With patient Time For Goal Achievement: 05/12/22 Potential to Achieve Goals: Good Progress towards PT goals: Progressing  toward goals    Frequency    7X/week      PT Plan Current plan remains appropriate    Co-evaluation              AM-PAC PT "6 Clicks" Mobility   Outcome Measure  Help needed turning from your back to your side while in a flat bed without using bedrails?: A Little Help needed moving from lying on your back to sitting on the side of a flat bed without using bedrails?: A Little Help needed moving to and from a bed to a chair (including a wheelchair)?: A Little Help needed standing up from a chair using your arms (e.g., wheelchair or bedside chair)?: A Little Help needed to walk in hospital room?: A Little Help needed climbing 3-5 steps with a railing? : A Lot 6 Click Score: 17    End of Session Equipment Utilized During Treatment: Gait belt;Right knee immobilizer Activity Tolerance: Patient tolerated treatment well Patient left: in chair;with call bell/phone within reach;with chair alarm set Nurse Communication: Mobility status PT Visit Diagnosis: Difficulty in walking, not elsewhere classified (R26.2)     Time: [id number] PT Time Calculation (min) (ACUTE ONLY): 20 min  Charges:  $Gait Training: 8-22 mins $Therapeutic Exercise: 8-22 mins                     Debe Coder PT Acute Rehabilitation Services Pager [phone] Office [phone]    Jashira Cotugno 05/06/2022, 11:03 AM

## 2022-05-06 NOTE — Discharge Instructions (Signed)

## 2022-05-07 ENCOUNTER — Other Ambulatory Visit: Payer: Self-pay | Admitting: Physician Assistant

## 2022-05-07 DIAGNOSIS — M1712 Unilateral primary osteoarthritis, left knee: Secondary | ICD-10-CM | POA: Diagnosis not present

## 2022-05-07 NOTE — Progress Notes (Signed)
Subjective: 2 Days Post-Op Procedure(s) (LRB): LEFT TOTAL KNEE ARTHROPLASTY (Left) Patient reports pain as moderate.    Objective: Vital signs in last 24 hours: Temp:  [97.7 F (36.5 C)-98.3 F (36.8 C)] 98 F (36.7 C) (02/18 0544) Pulse Rate:  [63-80] 80 (02/18 0544) Resp:  [16-18] 16 (02/18 0544) BP: (105-116)/(51-59) 105/53 (02/18 0544) SpO2:  [89 %-100 %] 89 % (02/18 0544)  Intake/Output from previous day: 02/17 0701 - 02/18 0700 In: 960 [P.O.:960] Out: 325 [Urine:325] Intake/Output this shift: No intake/output data recorded.  Recent Labs    05/06/22 0321  HGB 10.9*   Recent Labs    05/06/22 0321  WBC 15.0*  RBC 3.44*  HCT 33.8*  PLT 289   Recent Labs    05/06/22 0321  NA 136  K 4.5  CL 105  CO2 23  BUN 12  CREATININE 0.88  GLUCOSE 127*  CALCIUM 9.4   No results for input(s): "LABPT", "INR" in the last 72 hours.  Neurologically intact Neurovascular intact Sensation intact distally Intact pulses distally Dorsiflexion/Plantar flexion intact Incision: scant drainage No cellulitis present Compartment soft   Assessment/Plan: 2 Days Post-Op Procedure(s) (LRB): LEFT TOTAL KNEE ARTHROPLASTY (Left) Advance diet Up with therapy D/C IV fluids Discharge home with home health likely after second PT session today as long as O2 sats and BP stable and she progresses well with PT WBAT LLE ABLA- mild and stable Encouraged po fluids       Aundra Dubin 05/07/2022, 8:54 AM

## 2022-05-07 NOTE — Progress Notes (Signed)
Physical Therapy Treatment Patient Details [name] MRN: [id number] DOB: 04-17-1950 Today's Date: 05/07/2022   History of Present Illness Pt s/p L TKR    PT Comments    Pt continues cooperative but reporting increased pain starting last night.  Pt performed limited therex program and requesting short break prior to ambulation attempt.   Recommendations for follow up therapy are one component of a multi-disciplinary discharge planning process, led by the attending physician.  Recommendations may be updated based on patient status, additional functional criteria and insurance authorization.  Follow Up Recommendations  Follow physician's recommendations for discharge plan and follow up therapies     Assistance Recommended at Discharge Frequent or constant Supervision/Assistance  Patient can return home with the following A little help with walking and/or transfers;A little help with bathing/dressing/bathroom;Assistance with cooking/housework;Assist for transportation;Help with stairs or ramp for entrance   Equipment Recommendations  Rolling walker (2 wheels)    Recommendations for Other Services       Precautions / Restrictions Precautions Precautions: Knee;Fall Required Braces or Orthoses: Knee Immobilizer - Left Knee Immobilizer - Left: Discontinue once straight leg raise with < 10 degree lag Restrictions Weight Bearing Restrictions: No Other Position/Activity Restrictions: WBAT     Mobility  Bed Mobility                    Transfers                        Ambulation/Gait                   Stairs             Wheelchair Mobility    Modified Rankin (Stroke Patients Only)       Balance                                            Cognition Arousal/Alertness: Awake/alert Behavior During Therapy: WFL for tasks assessed/performed Overall Cognitive Status: Within Functional Limits for tasks assessed                                           Exercises Total Joint Exercises Ankle Circles/Pumps: AROM, Both, 15 reps, Supine Quad Sets: AROM, Both, Supine, 10 reps Heel Slides: AAROM, Left, Supine, 10 reps Straight Leg Raises: AAROM, Left, Supine, 10 reps Goniometric ROM: AAROM -10 - 40 - pain limited    General Comments        Pertinent Vitals/Pain Pain Assessment Pain Assessment: 0-10 Pain Score: 7  Pain Location: R knee Pain Descriptors / Indicators: Aching, Sore Pain Intervention(s): Limited activity within patient's tolerance, Monitored during session, Premedicated before session    Home Living                          Prior Function            PT Goals (current goals can now be found in the care plan section) Acute Rehab PT Goals Patient Stated Goal: REgain INd PT Goal Formulation: With patient Time For Goal Achievement: 05/12/22 Potential to Achieve Goals: Good Progress towards PT goals: Not progressing toward goals - comment (pain limited)    Frequency  7X/week      PT Plan Current plan remains appropriate    Co-evaluation              AM-PAC PT "6 Clicks" Mobility   Outcome Measure  Help needed turning from your back to your side while in a flat bed without using bedrails?: A Little Help needed moving from lying on your back to sitting on the side of a flat bed without using bedrails?: A Little Help needed moving to and from a bed to a chair (including a wheelchair)?: A Little Help needed standing up from a chair using your arms (e.g., wheelchair or bedside chair)?: A Little Help needed to walk in hospital room?: A Little Help needed climbing 3-5 steps with a railing? : A Lot 6 Click Score: 17    End of Session   Activity Tolerance: Patient tolerated treatment well Patient left: in bed;with call bell/phone within reach;with family/visitor present Nurse Communication: Mobility status PT Visit Diagnosis:  Difficulty in walking, not elsewhere classified (R26.2)     Time: [id number] PT Time Calculation (min) (ACUTE ONLY): 14 min  Charges:  $Therapeutic Exercise: 8-22 mins                     [place] Pager [phone] Office [phone]    Gar Glance 05/07/2022, 9:29 AM

## 2022-05-07 NOTE — Progress Notes (Signed)
Physical Therapy Treatment Patient Details [name] MRN: [id number] DOB: 03/11/1951 Today's Date: 05/07/2022   History of Present Illness Pt s/p L TKR    PT Comments    Pt continues very cooperative and up to ambulate increased distance in hall with improved pain control but continues limited by c/o dizziness - BP 139/73 - RN aware.   Recommendations for follow up therapy are one component of a multi-disciplinary discharge planning process, led by the attending physician.  Recommendations may be updated based on patient status, additional functional criteria and insurance authorization.  Follow Up Recommendations  Follow physician's recommendations for discharge plan and follow up therapies     Assistance Recommended at Discharge Frequent or constant Supervision/Assistance  Patient can return home with the following A little help with walking and/or transfers;A little help with bathing/dressing/bathroom;Assistance with cooking/housework;Assist for transportation;Help with stairs or ramp for entrance   Equipment Recommendations  Rolling walker (2 wheels)    Recommendations for Other Services       Precautions / Restrictions Precautions Precautions: Knee;Fall Required Braces or Orthoses: Knee Immobilizer - Left Knee Immobilizer - Left: Discontinue once straight leg raise with < 10 degree lag Restrictions Weight Bearing Restrictions: No Other Position/Activity Restrictions: WBAT     Mobility  Bed Mobility Overal bed mobility: Needs Assistance Bed Mobility: Supine to Sit, Sit to Supine     Supine to sit: Min assist Sit to supine: Min assist   General bed mobility comments: cues for sequence with assist to manage L LE;  attempted use of gait belt to assist L LE but only partially successful    Transfers Overall transfer level: Needs assistance Equipment used: Rolling walker (2 wheels) Transfers: Sit to/from Stand, Bed to chair/wheelchair/BSC Sit to Stand:  Min guard   Step pivot transfers: Min guard       General transfer comment: cues for LE management and use of UEs to self assist.  Physical assist to bring wt up and fwd and to balance in initial standing    Ambulation/Gait Ambulation/Gait assistance: Min assist Gait Distance (Feet): 58 Feet Assistive device: Rolling walker (2 wheels) Gait Pattern/deviations: Step-to pattern, Decreased step length - right, Decreased step length - left, Shuffle, Antalgic, Trunk flexed Gait velocity: decr     General Gait Details: cues for sequence, posture and position from RW; distance ltd by pain/fatigue and c/o dizziness   Stairs             Wheelchair Mobility    Modified Rankin (Stroke Patients Only)       Balance Overall balance assessment: Needs assistance Sitting-balance support: Feet supported, No upper extremity supported Sitting balance-Leahy Scale: Good     Standing balance support: No upper extremity supported Standing balance-Leahy Scale: Fair                              Cognition Arousal/Alertness: Awake/alert Behavior During Therapy: WFL for tasks assessed/performed Overall Cognitive Status: Within Functional Limits for tasks assessed                                          Exercises Total Joint Exercises Ankle Circles/Pumps: AROM, Both, 15 reps, Supine Quad Sets: AROM, Both, Supine, 10 reps Heel Slides: AAROM, Left, Supine, 10 reps Straight Leg Raises: AAROM, Left, Supine, 10 reps Goniometric ROM: AAROM -10 -  40 - pain limited    General Comments        Pertinent Vitals/Pain Pain Assessment Pain Assessment: 0-10 Pain Score: 4  Pain Location: R knee Pain Descriptors / Indicators: Aching, Sore Pain Intervention(s): Limited activity within patient's tolerance, Monitored during session, Premedicated before session, Ice applied    Home Living                          Prior Function            PT Goals  (current goals can now be found in the care plan section) Acute Rehab PT Goals Patient Stated Goal: REgain INd PT Goal Formulation: With patient Time For Goal Achievement: 05/12/22 Potential to Achieve Goals: Good Progress towards PT goals: Progressing toward goals    Frequency    7X/week      PT Plan Current plan remains appropriate    Co-evaluation              AM-PAC PT "6 Clicks" Mobility   Outcome Measure  Help needed turning from your back to your side while in a flat bed without using bedrails?: A Little Help needed moving from lying on your back to sitting on the side of a flat bed without using bedrails?: A Little Help needed moving to and from a bed to a chair (including a wheelchair)?: A Little Help needed standing up from a chair using your arms (e.g., wheelchair or bedside chair)?: A Little Help needed to walk in hospital room?: A Little Help needed climbing 3-5 steps with a railing? : A Lot 6 Click Score: 17    End of Session Equipment Utilized During Treatment: Gait belt;Right knee immobilizer Activity Tolerance: Patient limited by fatigue Patient left: in bed;with call bell/phone within reach Nurse Communication: Mobility status PT Visit Diagnosis: Difficulty in walking, not elsewhere classified (R26.2)     Time: [id number] PT Time Calculation (min) (ACUTE ONLY): 29 min  Charges:  $Gait Training: 8-22 mins $Therapeutic Activity: 8-22 mins                     [place] Pager [phone] Office [phone]    [facility] 05/07/2022, 4:48 PM

## 2022-05-07 NOTE — Discharge Summary (Signed)
Patient ID: Michelle Foster MRN: [id number] DOB/AGE: 1950/11/24 72 y.o.  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 female who was admitted 05/05/2022 for operative treatment ofUnilateral primary osteoarthritis, left knee. Patient has severe unremitting pain that affects sleep, daily activities, and work/hobbies. After pre-op clearance the patient was taken to the operating room on 05/05/2022 and underwent  Procedure(s): LEFT TOTAL KNEE ARTHROPLASTY.    Patient was given perioperative antibiotics:  Anti-infectives (From admission, onward)    Start     Dose/Rate Route Frequency Ordered Stop   05/05/22 1800  ceFAZolin (ANCEF) IVPB 1 g/50 mL premix        1 g 100 mL/hr over 30 Minutes Intravenous Every 6 hours 05/05/22 1534 05/06/22 0035   05/05/22 0915  ceFAZolin (ANCEF) IVPB 2g/100 mL premix        2 g 200 mL/hr over 30 Minutes Intravenous On call to O.R. 05/05/22 0900 05/05/22 1211        Patient was given sequential compression devices, early ambulation, and chemoprophylaxis to prevent DVT.  Patient benefited maximally from hospital stay and there were no complications.    Recent vital signs: Patient Vitals for the past 24 hrs:  BP Temp Temp src Pulse  Resp SpO2  05/07/22 0544 (!) 105/53 98 F (36.7 C) Oral 80 16 (!) 89 %  05/06/22 2153 (!) 115/55 98.3 F (36.8 C) Oral 80 17 91 %  05/06/22 1347 (!) 106/51 97.7 F (36.5 C) -- 63 18 93 %  05/06/22 0940 (!) 116/59 98.1 F (36.7 C) -- 72 17 100 %     Recent laboratory studies:  Recent Labs    05/06/22 0321  WBC 15.0*  HGB 10.9*  HCT 33.8*  PLT 289  NA 136  K 4.5  CL 105  CO2 23  BUN 12  CREATININE 0.88  GLUCOSE 127*  CALCIUM 9.4     Discharge Medications:   Allergies as of 05/07/2022       Reactions   Codeine Nausea And Vomiting        Medication List     STOP taking these medications    aspirin EC 81 MG tablet Replaced by: aspirin 81 MG chewable tablet       TAKE these medications    acetaminophen 325 MG tablet Commonly known as: TYLENOL Take 650 mg by mouth every 6 (six) hours as needed for moderate pain.   aspirin 81 MG chewable tablet Chew 1 tablet (81 mg total) by mouth 2 (two) times daily. Replaces: aspirin EC 81 MG tablet   ibuprofen 200 MG tablet Commonly known as: ADVIL Take 400 mg by mouth every 6 (six) hours as  needed.   ketoconazole 2 % shampoo Commonly known as: NIZORAL APPLY TOPICALLY TWICE A WEEK   ketoconazole 2 % cream Commonly known as: NIZORAL Apply 1 Application topically daily as needed for irritation.   levocetirizine 5 MG tablet Commonly known as: XYZAL Take 5 mg by mouth at bedtime.   methocarbamol 500 MG tablet Commonly known as: ROBAXIN Take 1 tablet (500 mg total) by mouth every 6 (six) hours as needed for muscle spasms.   nitrofurantoin (macrocrystal-monohydrate) 100 MG capsule Commonly known as: MACROBID TAKE 1 CAPSULE (100 MG TOTAL) BY MOUTH DAILY AS NEEDED (AFTER INTERCOURSE)   oxyCODONE 5 MG immediate release tablet Commonly known as: Oxy IR/ROXICODONE Take 1-2 tablets (5-10 mg total) by mouth every 4 (four) hours as needed for moderate pain (pain score 4-6).   rosuvastatin 20 MG tablet Commonly  known as: CRESTOR TAKE 1 TABLET BY MOUTH EVERY DAY What changed: when to take this               Durable Medical Equipment  (From admission, onward)           Start     Ordered   05/05/22 1535  DME 3 n 1  Once        05/05/22 1534   05/05/22 1535  DME Walker rolling  Once       Question Answer Comment  Walker: With 5 Inch Wheels   Patient needs a walker to treat with the following condition Status post total left knee replacement      05/05/22 1534            Diagnostic Studies: DG Knee Left Port  Result Date: 05/05/2022 CLINICAL DATA:  Left knee arthroplasty EXAM: PORTABLE LEFT KNEE - 1-2 VIEW COMPARISON:  02/23/2022 FINDINGS: Frontal and lateral views of the left knee are obtained. 3 component left knee arthroplasty is identified in the expected position without evidence of acute complication. Postsurgical changes are seen in the overlying soft tissues. IMPRESSION: 1. Left knee arthroplasty as above. Electronically Signed   By: Randa Ngo M.D.   On: 05/05/2022 15:28    Disposition: Discharge disposition: 01-Home or [place]     Mcarthur Rossetti, MD Follow up in 2 week(s).   Specialty: Orthopedic Surgery Contact information: [address] [place] [address] [phone]                  Signed: Aundra Dubin 05/07/2022, 8:57 AM

## 2022-05-07 NOTE — TOC Initial Note (Signed)
Transition of Care [facility]) - Initial/Assessment Note    Patient Details  [name] MRN: [id number] Date of Birth: [birth year]  [facility] CM/SW Contact:    Henrietta Dine, RN Phone Number: 05/07/2022, 12:34 PM  Clinical Narrative:                 Pt has ortho bundle; called pt in room to confirm receipt of DME (3-N-1 and RW) and [facility] agency; she says her DME has not been delivered; called Jasmine at [facility] and she will deliver RW to room; she will also run insurance to see if pt eligible to get 3-N-1; awaiting response  1239- spoke w/ Delana Meyer at [facility] and she says driver is enroute to deliver 3-N-1 and RW to pt's room; pt notified and verbalized understanding; no TOC needs.        Patient Goals and CMS Choice            Expected Discharge Plan and Services         Expected Discharge Date: 05/07/22                                    Prior Living Arrangements/Services                       Activities of Daily Living Home Assistive Devices/Equipment: CBG Meter, Cane (specify quad or straight) ADL Screening (condition at time of admission) Patient's cognitive ability adequate to safely complete daily activities?: Yes Is the patient deaf or have difficulty hearing?: No Does the patient have difficulty seeing, even when wearing glasses/contacts?: No Does the patient have difficulty concentrating, remembering, or making decisions?: No Patient able to express need for assistance with ADLs?: Yes Does the patient have difficulty dressing or bathing?: No Independently performs ADLs?: Yes (appropriate for developmental age) Does the patient have difficulty walking or climbing stairs?: No Weakness of Legs: Left Weakness of Arms/Hands: None  Permission Sought/Granted                  Emotional Assessment              Admission diagnosis:  Status post total left knee replacement [Z96.652] Patient Active Problem List    Diagnosis Date Noted   Status post total left knee replacement 05/05/2022   Unilateral primary osteoarthritis, left knee 02/23/2022   Dysuria 04/08/2020   Bilateral lower extremity edema 08/30/2018   Hyperlipidemia 03/30/2017   Atypical chest pain 03/30/2017   Dyspnea on exertion 03/30/2017   PCP:  Susy Frizzle, MD Pharmacy:   [id number] [id number]: [phone]: [phone]    Social Determinants of Health (SDOH) Social History: SDOH Screenings   Food Insecurity: No Food Insecurity (05/05/2022)  Housing: Low Risk  (05/05/2022)  Transportation Needs: No Transportation Needs (05/05/2022)  Utilities: Not At Risk (05/05/2022)  Alcohol Screen: Low Risk  (03/16/2022)  Depression (PHQ2-9): Low Risk  (03/16/2022)  Financial Resource Strain: Low Risk  (03/16/2022)  Physical Activity: Inactive (03/16/2022)  Social Connections: Moderately Integrated (03/16/2022)  Stress: No Stress Concern Present (03/16/2022)  Tobacco Use: Low Risk  (05/05/2022)   SDOH Interventions:     Readmission Risk Interventions     No data to display

## 2022-05-07 NOTE — Progress Notes (Signed)
Physical Therapy Treatment Patient Details [name] MRN: [id number] DOB: [birth year] Today's Date: 05/07/2022   History of Present Illness Pt s/p L TKR    PT Comments    Pt continues motivated but gait limited this am by c/o pain, fatigue and dizziness with mobility - BP 138/69 - RN aware.   Recommendations for follow up therapy are one component of a multi-disciplinary discharge planning process, led by the attending physician.  Recommendations may be updated based on patient status, additional functional criteria and insurance authorization.  Follow Up Recommendations  Follow physician's recommendations for discharge plan and follow up therapies     Assistance Recommended at Discharge Frequent or constant Supervision/Assistance  Patient can return home with the following A little help with walking and/or transfers;A little help with bathing/dressing/bathroom;Assistance with cooking/housework;Assist for transportation;Help with stairs or ramp for entrance   Equipment Recommendations  Rolling walker (2 wheels)    Recommendations for Other Services       Precautions / Restrictions Precautions Precautions: Knee;Fall Required Braces or Orthoses: Knee Immobilizer - Left Knee Immobilizer - Left: Discontinue once straight leg raise with < 10 degree lag Restrictions Weight Bearing Restrictions: No Other Position/Activity Restrictions: WBAT     Mobility  Bed Mobility Overal bed mobility: Needs Assistance Bed Mobility: Supine to Sit     Supine to sit: Min assist     General bed mobility comments: cues for sequence with assist to manage L LE    Transfers Overall transfer level: Needs assistance Equipment used: Rolling walker (2 wheels) Transfers: Sit to/from Stand Sit to Stand: Min assist           General transfer comment: cues for LE management and use of UEs to self assist.  Physical assist to bring wt up and fwd and to balance in initial standing     Ambulation/Gait Ambulation/Gait assistance: Min assist Gait Distance (Feet): 33 Feet Assistive device: Rolling walker (2 wheels) Gait Pattern/deviations: Step-to pattern, Decreased step length - right, Decreased step length - left, Shuffle, Antalgic, Trunk flexed Gait velocity: decr     General Gait Details: cues for sequence, posture and position from RW; distance ltd by pain/fatigue and c/o dizziness   Stairs             Wheelchair Mobility    Modified Rankin (Stroke Patients Only)       Balance Overall balance assessment: Needs assistance Sitting-balance support: Feet supported, No upper extremity supported Sitting balance-Leahy Scale: Good     Standing balance support: No upper extremity supported Standing balance-Leahy Scale: Fair                              Cognition Arousal/Alertness: Awake/alert Behavior During Therapy: WFL for tasks assessed/performed Overall Cognitive Status: Within Functional Limits for tasks assessed                                          Exercises Total Joint Exercises Ankle Circles/Pumps: AROM, Both, 15 reps, Supine Quad Sets: AROM, Both, Supine, 10 reps Heel Slides: AAROM, Left, Supine, 10 reps Straight Leg Raises: AAROM, Left, Supine, 10 reps Goniometric ROM: AAROM -10 - 40 - pain limited    General Comments        Pertinent Vitals/Pain Pain Assessment Pain Assessment: 0-10 Pain Score: 7  Pain Location: R knee Pain  Descriptors / Indicators: Aching, Sore Pain Intervention(s): Limited activity within patient's tolerance, Monitored during session, Premedicated before session, Ice applied    Home Living                          Prior Function            PT Goals (current goals can now be found in the care plan section) Acute Rehab PT Goals Patient Stated Goal: REgain INd PT Goal Formulation: With patient Time For Goal Achievement: 05/12/22 Potential to Achieve Goals:  Good Progress towards PT goals: Not progressing toward goals - comment (pain, fatigue, dizziness)    Frequency    7X/week      PT Plan Current plan remains appropriate    Co-evaluation              AM-PAC PT "6 Clicks" Mobility   Outcome Measure  Help needed turning from your back to your side while in a flat bed without using bedrails?: A Little Help needed moving from lying on your back to sitting on the side of a flat bed without using bedrails?: A Little Help needed moving to and from a bed to a chair (including a wheelchair)?: A Little Help needed standing up from a chair using your arms (e.g., wheelchair or bedside chair)?: A Little Help needed to walk in hospital room?: A Lot Help needed climbing 3-5 steps with a railing? : Total 6 Click Score: 15    End of Session Equipment Utilized During Treatment: Gait belt;Right knee immobilizer Activity Tolerance: Patient limited by pain;Patient limited by fatigue Patient left: in chair;with call bell/phone within reach;with chair alarm set Nurse Communication: Mobility status PT Visit Diagnosis: Difficulty in walking, not elsewhere classified (R26.2)     Time: 0912-0929 PT Time Calculation (min) (ACUTE ONLY): 17 min  Charges:  $Gait Training: 8-22 mins $Therapeutic Exercise: 8-22 mins                     [place] Pager [phone] Office [phone]    [facility] 05/07/2022, 9:33 AM

## 2022-05-07 NOTE — Plan of Care (Signed)
  Problem: Education: Goal: Knowledge of the prescribed therapeutic regimen will improve Outcome: Progressing   Problem: Activity: Goal: Ability to avoid complications of mobility impairment will improve Outcome: Progressing   Problem: Pain Management: Goal: Pain level will decrease with appropriate interventions Outcome: Progressing   

## 2022-05-08 ENCOUNTER — Encounter (HOSPITAL_COMMUNITY): Payer: Self-pay | Admitting: Orthopaedic Surgery

## 2022-05-08 DIAGNOSIS — M1712 Unilateral primary osteoarthritis, left knee: Secondary | ICD-10-CM | POA: Diagnosis not present

## 2022-05-08 NOTE — Plan of Care (Signed)

## 2022-05-08 NOTE — TOC Transition Note (Signed)
Transition of Care [facility]) - CM/SW Discharge Note  Patient Details  [name] MRN: [id number] Date of Birth: Feb 16, 1951  Transition of Care [facility]) CM/SW Contact:  Sherie Don, LCSW Phone Number: 05/08/2022, 10:34 AM  Clinical Narrative: Rolling walker and 3N1 were delivered to patient's room. CSW met with patient to confirm HHPT. Per patient, she has been set up with [facility] in [place] with her first appointment scheduled for 05/10/22. Patient will transition to OPPT at [facility] after working with [facility]. TOC signing off.  Final next level of care: [place] Barriers to Discharge: No Barriers Identified  Patient Goals and CMS Choice Choice offered to / list presented to : Patient  Discharge Plan and Services Additional resources added to the After Visit Summary for   Discharge Planning Services: CM Consult Post Acute Care Choice: Durable Medical Equipment          DME Arranged: 3-N-1, Walker rolling DME Agency: [employer] Date DME Agency Contacted: 05/07/22 Time DME Agency Contacted: [id number] Representative spoke with at DME Agency: [place]: [place]: [facility] Representative spoke with at [place] in orthopedist's office  Social Determinants of Health ([place]) Interventions SDOH Screenings   Food Insecurity: No Food Insecurity (05/05/2022)  Housing: Low Risk  (05/05/2022)  Transportation Needs: No Transportation Needs (05/05/2022)  Utilities: Not At Risk (05/05/2022)  Alcohol Screen: Low Risk  (03/16/2022)  Depression (PHQ2-9): Low Risk  (03/16/2022)  Financial Resource Strain: Low Risk  (03/16/2022)  Physical Activity: Inactive (03/16/2022)  Social Connections: Moderately Integrated (03/16/2022)  Stress: No Stress Concern Present (03/16/2022)  Tobacco Use: Low Risk  (05/08/2022)   Readmission Risk Interventions     No data to display

## 2022-05-08 NOTE — Plan of Care (Signed)
Problem: Education: Goal: Knowledge of the prescribed therapeutic regimen will improve 05/08/2022 1044 by Iver Nestle A, LPN Outcome: Adequate for Discharge 05/08/2022 1044 by Lise Auer, LPN Outcome: Adequate for Discharge Goal: Individualized Educational Video(s) 05/08/2022 1044 by Lise Auer, LPN Outcome: Adequate for Discharge 05/08/2022 1044 by Lise Auer, LPN Outcome: Adequate for Discharge   Problem: Activity: Goal: Ability to avoid complications of mobility impairment will improve 05/08/2022 1044 by Iver Nestle A, LPN Outcome: Adequate for Discharge 05/08/2022 1044 by Iver Nestle A, LPN Outcome: Adequate for Discharge Goal: Range of joint motion will improve 05/08/2022 1044 by Lise Auer, LPN Outcome: Adequate for Discharge 05/08/2022 1044 by Iver Nestle A, LPN Outcome: Adequate for Discharge   Problem: Clinical Measurements: Goal: Postoperative complications will be avoided or minimized 05/08/2022 1044 by Iver Nestle A, LPN Outcome: Adequate for Discharge 05/08/2022 1044 by Iver Nestle A, LPN Outcome: Adequate for Discharge   Problem: Pain Management: Goal: Pain level will decrease with appropriate interventions 05/08/2022 1044 by Iver Nestle A, LPN Outcome: Adequate for Discharge 05/08/2022 1044 by Iver Nestle A, LPN Outcome: Adequate for Discharge   Problem: Skin Integrity: Goal: Will show signs of wound healing 05/08/2022 1044 by Iver Nestle A, LPN Outcome: Adequate for Discharge 05/08/2022 1044 by Lise Auer, LPN Outcome: Adequate for Discharge   Problem: Education: Goal: Knowledge of General Education information will improve Description: Including pain rating scale, medication(s)/side effects and non-pharmacologic comfort measures 05/08/2022 1044 by Iver Nestle A, LPN Outcome: Adequate for Discharge 05/08/2022 1044 by Lise Auer, LPN Outcome: Adequate for Discharge   Problem: Health  Behavior/Discharge Planning: Goal: Ability to manage health-related needs will improve 05/08/2022 1044 by Iver Nestle A, LPN Outcome: Adequate for Discharge 05/08/2022 1044 by Iver Nestle A, LPN Outcome: Adequate for Discharge   Problem: Clinical Measurements: Goal: Ability to maintain clinical measurements within normal limits will improve 05/08/2022 1044 by Iver Nestle A, LPN Outcome: Adequate for Discharge 05/08/2022 1044 by Iver Nestle A, LPN Outcome: Adequate for Discharge Goal: Will remain free from infection 05/08/2022 1044 by Iver Nestle A, LPN Outcome: Adequate for Discharge 05/08/2022 1044 by Iver Nestle A, LPN Outcome: Adequate for Discharge Goal: Diagnostic test results will improve 05/08/2022 1044 by Lise Auer, LPN Outcome: Adequate for Discharge 05/08/2022 1044 by Iver Nestle A, LPN Outcome: Adequate for Discharge Goal: Respiratory complications will improve 05/08/2022 1044 by Iver Nestle A, LPN Outcome: Adequate for Discharge 05/08/2022 1044 by Iver Nestle A, LPN Outcome: Adequate for Discharge Goal: Cardiovascular complication will be avoided 05/08/2022 1044 by Iver Nestle A, LPN Outcome: Adequate for Discharge 05/08/2022 1044 by Lise Auer, LPN Outcome: Adequate for Discharge   Problem: Activity: Goal: Risk for activity intolerance will decrease 05/08/2022 1044 by Iver Nestle A, LPN Outcome: Adequate for Discharge 05/08/2022 1044 by Iver Nestle A, LPN Outcome: Adequate for Discharge   Problem: Nutrition: Goal: Adequate nutrition will be maintained 05/08/2022 1044 by Iver Nestle A, LPN Outcome: Adequate for Discharge 05/08/2022 1044 by Iver Nestle A, LPN Outcome: Adequate for Discharge   Problem: Coping: Goal: Level of anxiety will decrease 05/08/2022 1044 by Iver Nestle A, LPN Outcome: Adequate for Discharge 05/08/2022 1044 by Iver Nestle A, LPN Outcome: Adequate for Discharge   Problem:  Elimination: Goal: Will not experience complications related to bowel motility 05/08/2022 1044 by Lise Auer, LPN Outcome: Adequate for Discharge 05/08/2022 1044 by Iver Nestle A, LPN Outcome: Adequate for Discharge Goal: Will not experience complications related to urinary  retention 05/08/2022 1044 by Lise Auer, LPN Outcome: Adequate for Discharge 05/08/2022 1044 by Iver Nestle A, LPN Outcome: Adequate for Discharge   Problem: Pain Managment: Goal: General experience of comfort will improve 05/08/2022 1044 by Lise Auer, LPN Outcome: Adequate for Discharge 05/08/2022 1044 by Iver Nestle A, LPN Outcome: Adequate for Discharge   Problem: Safety: Goal: Ability to remain free from injury will improve 05/08/2022 1044 by Lise Auer, LPN Outcome: Adequate for Discharge 05/08/2022 1044 by Iver Nestle A, LPN Outcome: Adequate for Discharge   Problem: Skin Integrity: Goal: Risk for impaired skin integrity will decrease 05/08/2022 1044 by Iver Nestle A, LPN Outcome: Adequate for Discharge 05/08/2022 1044 by Lise Auer, LPN Outcome: Adequate for Discharge

## 2022-05-08 NOTE — Progress Notes (Signed)
Patient ID: Michelle Foster, female   DOB: 1950-10-21, 72 y.o.   MRN: [id number] The patient is awake and alert this morning.  Her vital signs are stable.  Her left operative knee is stable.  We will see if we can get her discharged to home today.

## 2022-05-08 NOTE — Progress Notes (Signed)
Physical Therapy Treatment Patient Details [name] MRN: [id number] DOB: December 12, 1950 Today's Date: 05/08/2022   History of Present Illness Pt s/p L TKR    PT Comments    POD # 1 am session Pt AxO x 3 pleasant and eager to go home.  Assisted OOB.  General bed mobility comments: demonstarted and instructed on how to use belt to self assist LE.  General transfer comment: 25% VCs on proper hand placement and safety with turns. General stair comments: 25% VC's on proper walker placement as well as sequencing.  Pt performed well. Then returned to room to perform some TE's following HEP handout.  Instructed on proper tech, freq as well as use of ICE.   Addressed all mobility questions, discussed appropriate activity, educated on use of ICE.  Pt ready for D/C to home.   Recommendations for follow up therapy are one component of a multi-disciplinary discharge planning process, led by the attending physician.  Recommendations may be updated based on patient status, additional functional criteria and insurance authorization.  Follow Up Recommendations  Follow physician's recommendations for discharge plan and follow up therapies     Assistance Recommended at Discharge Frequent or constant Supervision/Assistance  Patient can return home with the following A little help with walking and/or transfers;A little help with bathing/dressing/bathroom;Assistance with cooking/housework;Assist for transportation;Help with stairs or ramp for entrance   Equipment Recommendations  Rolling walker (2 wheels)    Recommendations for Other Services       Precautions / Restrictions Precautions Precautions: Knee;Fall Precaution Comments: no pillow under knee Restrictions Weight Bearing Restrictions: No Other Position/Activity Restrictions: WBAT     Mobility  Bed Mobility Overal bed mobility: Needs Assistance Bed Mobility: Supine to Sit     Supine to sit: Supervision     General bed  mobility comments: demonstarted and instructed on how to use belt to self assist LE    Transfers Overall transfer level: Needs assistance Equipment used: Rolling walker (2 wheels) Transfers: Sit to/from Stand Sit to Stand: Supervision           General transfer comment: 25% VCs on proper hand placement and safety with turns.    Ambulation/Gait Ambulation/Gait assistance: Supervision, Min guard Gait Distance (Feet): 65 Feet Assistive device: Rolling walker (2 wheels) Gait Pattern/deviations: Step-to pattern, Decreased step length - right, Decreased step length - left, Shuffle, Antalgic, Trunk flexed Gait velocity: decr     General Gait Details: cues for sequence, posture and position from RW;   Stairs Stairs: Yes Stairs assistance: Supervision, Min guard Stair Management: No rails Number of Stairs: 1 General stair comments: 25% VC's on proper walker placement as well as sequencing.  Pt performed well.   Wheelchair Mobility    Modified Rankin (Stroke Patients Only)       Balance                                            Cognition Arousal/Alertness: Awake/alert Behavior During Therapy: WFL for tasks assessed/performed Overall Cognitive Status: Within Functional Limits for tasks assessed                                 General Comments: AxO x 3 very pleasant and eager to go home today        Exercises  Total Knee  Replacement TE's following HEP handout 10 reps B LE ankle pumps 05 reps towel squeezes 05 reps knee presses 05 reps heel slides  05 reps SAQ's 05 reps SLR's 05 reps ABD Educated on use of gait belt to assist with TE's Followed by ICE     General Comments        Pertinent Vitals/Pain Pain Assessment Pain Assessment: 0-10 Pain Score: 5  Pain Location: R knee Pain Descriptors / Indicators: Aching, Sore, Operative site guarding Pain Intervention(s): Monitored during session, Premedicated before session,  Repositioned, Ice applied    Home Living                          Prior Function            PT Goals (current goals can now be found in the care plan section) Progress towards PT goals: Progressing toward goals    Frequency    7X/week      PT Plan Current plan remains appropriate    Co-evaluation              AM-PAC PT "6 Clicks" Mobility   Outcome Measure  Help needed turning from your back to your side while in a flat bed without using bedrails?: A Little Help needed moving from lying on your back to sitting on the side of a flat bed without using bedrails?: A Little Help needed moving to and from a bed to a chair (including a wheelchair)?: A Little Help needed standing up from a chair using your arms (e.g., wheelchair or bedside chair)?: A Little Help needed to walk in hospital room?: A Little Help needed climbing 3-5 steps with a railing? : A Little 6 Click Score: 18    End of Session Equipment Utilized During Treatment: Gait belt Activity Tolerance: Patient limited by fatigue Patient left: in chair;with call bell/phone within reach Nurse Communication: Mobility status PT Visit Diagnosis: Difficulty in walking, not elsewhere classified (R26.2)     Time: [id number] PT Time Calculation (min) (ACUTE ONLY): 28 min  Charges:  $Gait Training: 8-22 mins $Therapeutic Activity: 8-22 mins                     Rica Koyanagi  PTA [place] Office M-F          [phone] Weekend pager [phone]

## 2022-05-10 ENCOUNTER — Telehealth: Payer: Self-pay | Admitting: *Deleted

## 2022-05-10 NOTE — Telephone Encounter (Signed)
Ortho bundle D/C call completed. 

## 2022-05-18 ENCOUNTER — Encounter: Payer: Self-pay | Admitting: Orthopaedic Surgery

## 2022-05-18 ENCOUNTER — Ambulatory Visit (INDEPENDENT_AMBULATORY_CARE_PROVIDER_SITE_OTHER): Payer: Medicare PPO | Admitting: Orthopaedic Surgery

## 2022-05-18 ENCOUNTER — Telehealth: Payer: Self-pay | Admitting: *Deleted

## 2022-05-18 DIAGNOSIS — Z96652 Presence of left artificial knee joint: Secondary | ICD-10-CM

## 2022-05-18 MED ORDER — METHOCARBAMOL 500 MG PO TABS: 500.0000 mg | ORAL_TABLET | Freq: Four times a day (QID) | ORAL | 1 refills | Status: DC | PRN

## 2022-05-18 MED ORDER — OXYCODONE HCL 5 MG PO TABS: 5.0000 mg | ORAL_TABLET | Freq: Four times a day (QID) | ORAL | 0 refills | Status: DC | PRN

## 2022-05-18 NOTE — Telephone Encounter (Signed)
Ortho bundle in office meeting completed. Orders faxed to [facility]PP[facility] to begin next week in her area of [place], [place].

## 2022-05-18 NOTE — Progress Notes (Signed)
The patient comes in today for first postoperative visit status post a left total knee arthroplasty.  She has been dealing with constipation.  She does need a refill of pain medication and muscle relaxant.  She has been able to flex her knee to past 100 degrees.  She is concerned about a little bit of the valgus malalignment of the knee.  When she stands she does have some slight valgus malalignment but the knee does not feel unstable to me.  The staples are removed and Steri-Strips applied.  Calf is soft.  We will transition to outpatient physical therapy and hopefully strengthen the muscles will help improve the function of the knee.  When we do see her back in 4 weeks I would like a standing AP and lateral of her left knee.  I will send in some more oxycodone and Robaxin in the interim as well.

## 2022-05-24 ENCOUNTER — Telehealth: Payer: Self-pay | Admitting: *Deleted

## 2022-05-24 ENCOUNTER — Other Ambulatory Visit: Payer: Self-pay

## 2022-05-24 ENCOUNTER — Emergency Department (HOSPITAL_COMMUNITY)
Admission: EM | Admit: 2022-05-24 | Discharge: 2022-05-24 | Disposition: A | Discharge status: Home / Self Care | Payer: Medicare PPO | Attending: Emergency Medicine | Admitting: Emergency Medicine

## 2022-05-24 ENCOUNTER — Encounter (HOSPITAL_COMMUNITY): Payer: Self-pay | Admitting: *Deleted

## 2022-05-24 ENCOUNTER — Emergency Department (HOSPITAL_COMMUNITY): Payer: Medicare PPO

## 2022-05-24 DIAGNOSIS — I82402 Acute embolism and thrombosis of unspecified deep veins of left lower extremity: Secondary | ICD-10-CM | POA: Diagnosis not present

## 2022-05-24 DIAGNOSIS — Z7901 Long term (current) use of anticoagulants: Secondary | ICD-10-CM | POA: Diagnosis not present

## 2022-05-24 DIAGNOSIS — I824Z2 Acute embolism and thrombosis of unspecified deep veins of left distal lower extremity: Secondary | ICD-10-CM

## 2022-05-24 DIAGNOSIS — M7989 Other specified soft tissue disorders: Secondary | ICD-10-CM | POA: Diagnosis present

## 2022-05-24 DIAGNOSIS — Z7982 Long term (current) use of aspirin: Secondary | ICD-10-CM | POA: Insufficient documentation

## 2022-05-24 MED ORDER — RIVAROXABAN 15 MG PO TABS
15.0000 mg | ORAL_TABLET | Freq: Once | ORAL | Status: AC
  Administered 2022-05-24: 15 mg via ORAL
  Filled 2022-05-24: qty 1

## 2022-05-24 MED ORDER — RIVAROXABAN (XARELTO) VTE STARTER PACK (15 & 20 MG): ORAL_TABLET | ORAL | 0 refills | Status: DC

## 2022-05-24 NOTE — Discharge Instructions (Signed)
Follow-up with your family doctor next week for recheck.  Do not take any more aspirin

## 2022-05-24 NOTE — ED Provider Notes (Signed)
[facility] Provider Note   CSN: [id number] Arrival date & time: 05/24/22  1534     History  Chief Complaint  Patient presents with   Leg Pain    Michelle Foster is a 72 y.o. female.  Patient had knee surgery done a couple weeks ago.  And she started having swelling and pain in her left calf recently.  The history is provided by the patient and medical records. No language interpreter was used.  Leg Pain Lower extremity pain location: Left calf swelling. Injury: no   Pain details:    Quality:  Aching   Radiates to:  Does not radiate   Severity:  Moderate   Timing:  Constant   Progression:  Worsening Chronicity:  New Dislocation: no   Foreign body present:  No foreign bodies Relieved by:  Nothing Associated symptoms: no back pain and no fatigue        Home Medications Prior to Admission medications   Medication Sig Start Date End Date Taking? Authorizing Provider  RIVAROXABAN Alveda Reasons) VTE STARTER PACK (15 & 20 MG) Follow package directions: Take one '15mg'$  tablet by mouth twice a day. On day 22, switch to one '20mg'$  tablet once a day. Take with food. 05/24/22  Yes Milton Ferguson, MD  acetaminophen (TYLENOL) 325 MG tablet Take 650 mg by mouth every 6 (six) hours as needed for moderate pain.    [provider]  aspirin 81 MG chewable tablet Chew 1 tablet (81 mg total) by mouth 2 (two) times daily. 05/06/22   Mcarthur Rossetti, MD  ibuprofen (ADVIL) 200 MG tablet Take 400 mg by mouth every 6 (six) hours as needed.    [provider]  ketoconazole (NIZORAL) 2 % cream Apply 1 Application topically daily as needed for irritation. 11/22/21   [provider]  ketoconazole (NIZORAL) 2 % shampoo APPLY TOPICALLY TWICE A WEEK Patient not taking: Reported on 04/27/2022 04/19/20   Alycia Rossetti, MD  levocetirizine (XYZAL) 5 MG tablet Take 5 mg by mouth at bedtime.    [provider]  methocarbamol  (ROBAXIN) 500 MG tablet Take 1 tablet (500 mg total) by mouth every 6 (six) hours as needed for muscle spasms. 05/18/22   Mcarthur Rossetti, MD  nitrofurantoin, macrocrystal-monohydrate, (MACROBID) 100 MG capsule TAKE 1 CAPSULE (100 MG TOTAL) BY MOUTH DAILY AS NEEDED (AFTER INTERCOURSE) 09/01/21   Susy Frizzle, MD  oxyCODONE (OXY IR/ROXICODONE) 5 MG immediate release tablet Take 1-2 tablets (5-10 mg total) by mouth every 6 (six) hours as needed for moderate pain (pain score 4-6). 05/18/22   Mcarthur Rossetti, MD  rosuvastatin (CRESTOR) 20 MG tablet TAKE 1 TABLET BY MOUTH EVERY DAY Patient taking differently: Take 20 mg by mouth at bedtime. 01/16/22   Susy Frizzle, MD      Allergies    Codeine    Review of Systems   Review of Systems  Constitutional:  Negative for appetite change and fatigue.  HENT:  Negative for congestion, ear discharge and sinus pressure.   Eyes:  Negative for discharge.  Respiratory:  Negative for cough.   Cardiovascular:  Negative for chest pain.  Gastrointestinal:  Negative for abdominal pain and diarrhea.  Genitourinary:  Negative for frequency and hematuria.  Musculoskeletal:  Negative for back pain.       Pain and swelling to left calf  Skin:  Negative for rash.  Neurological:  Negative for seizures and headaches.  Psychiatric/Behavioral:  Negative for hallucinations.     Physical Exam Updated Vital Signs BP (!) 145/81 (BP Location: Right Arm)   Pulse 90   Temp 97.7 F (36.5 C) (Oral)   Resp 18   SpO2 100%  Physical Exam Vitals and nursing note reviewed.  Constitutional:      Appearance: She is well-developed.  HENT:     Head: Normocephalic.     Nose: Nose normal.  Eyes:     General: No scleral icterus.    Conjunctiva/sclera: Conjunctivae normal.  Neck:     Thyroid: No thyromegaly.  Cardiovascular:     Rate and Rhythm: Normal rate and regular rhythm.     Heart sounds: No murmur heard.    No friction rub. No gallop.   Pulmonary:     Breath sounds: No stridor. No wheezing or rales.  Chest:     Chest wall: No tenderness.  Abdominal:     General: There is no distension.     Tenderness: There is no abdominal tenderness. There is no rebound.  Musculoskeletal:     Cervical back: Neck supple.     Comments: Recent surgery to right knee and tenderness and swelling to left calf  Lymphadenopathy:     Cervical: No cervical adenopathy.  Skin:    Findings: No erythema or rash.  Neurological:     Mental Status: She is alert and oriented to person, place, and time.     Motor: No abnormal muscle tone.     Coordination: Coordination normal.  Psychiatric:        Behavior: Behavior normal.     ED Results / Procedures / Treatments   Labs (all labs ordered are listed, but only abnormal results are displayed) Labs Reviewed - No data to display  EKG None  Radiology US Venous Img Lower Unilateral Left  Result Date: 05/24/2022 CLINICAL DATA:  Left lower extremity pain. Recent knee surgery on 05/05/2022. EXAM: LEFT LOWER EXTREMITY VENOUS DOPPLER ULTRASOUND TECHNIQUE: Gray-scale sonography with graded compression, as well as color Doppler and duplex ultrasound were performed to evaluate the lower extremity deep venous systems from the level of the common femoral vein and including the common femoral, femoral, profunda femoral, popliteal and calf veins including the posterior tibial, peroneal and gastrocnemius veins when visible. The superficial great saphenous vein was also interrogated. Spectral Doppler was utilized to evaluate flow at rest and with distal augmentation maneuvers in the common femoral, femoral and popliteal veins. COMPARISON:  None Available. FINDINGS: Contralateral Common Femoral Vein: Respiratory phasicity is normal and symmetric with the symptomatic side. No evidence of thrombus. Normal compressibility. Common Femoral Vein: No evidence of thrombus. Normal compressibility, respiratory phasicity and  response to augmentation. Saphenofemoral Junction: No evidence of thrombus. Normal compressibility and flow on color Doppler imaging. Profunda Femoral Vein: No evidence of thrombus. Normal compressibility and flow on color Doppler imaging. Femoral Vein: No evidence of thrombus. Normal compressibility, respiratory phasicity and response to augmentation. Popliteal Vein: No evidence of thrombus. Normal compressibility, respiratory phasicity and response to augmentation. Calf Veins: 1 of the paired posterior tibial veins is noncompressible in the lumen is expanded and filled with low-level internal echoes. Findings are consistent with segmental occlusive DVT. The peroneal veins are patent and compressible. Superficial Great Saphenous Vein: No evidence of thrombus. Normal compressibility. Venous Reflux:  None. Other Findings:  None. IMPRESSION: Positive for isolated calf DVT involving 1 of the paired posterior tibial veins. Electronically Signed   By: Jacqulynn Cadet M.D.   On: 05/24/2022  16:23    Procedures Procedures    Medications Ordered in ED Medications  Rivaroxaban (XARELTO) tablet 15 mg (has no administration in time range)    ED Course/ Medical Decision Making/ A&P                             Medical Decision Making Risk Prescription drug management.  This patient presents to the ED for concern of left calf swelling, this involves an extensive number of treatment options, and is a complaint that carries with it a high risk of complications and morbidity.  The differential diagnosis includes DVT or cellulitis left hip   Co morbidities that complicate the patient evaluation  Recent right knee surgery   Additional history obtained:  Additional history obtained from patient External records from outside source obtained and reviewed including hospital records   Lab Tests:  No labs Imaging Studies ordered:  I ordered imaging studies including ultrasound of left lower leg I  independently visualized and interpreted imaging which showed positive for DVT posterior tibial vein normal sinus rhythm I agree with the radiologist interpretation   Cardiac Monitoring: / EKG:  The patient was maintained on a cardiac monitor.  I personally viewed and interpreted the cardiac monitored which showed an underlying rhythm of: Normal sinus rhythm   Consultations Obtained:  No consultant  Problem List / ED Course / Critical interventions / Medication management  DVT I ordered medication including Xarelto Reevaluation of the patient after these medicines showed that the patient stayed the same I have reviewed the patients home medicines and have made adjustments as needed   Social Determinants of Health:  None   Test / Admission - Considered:  None  Patient with a DVT of the in the left calf.  She will be started on Xarelto.  She is instructed not to take anymore aspirin and she will follow-up with PCP next week  Final Clinical Impression(s) / ED Diagnoses Final diagnoses:  Lower leg DVT (deep venous thromboembolism), acute, left ([place])    Rx / DC Orders ED Discharge Orders          Ordered    RIVAROXABAN (XARELTO) VTE STARTER PACK (15 & 20 MG)        05/24/22 1651              Milton Ferguson, MD 05/26/22 1137

## 2022-05-24 NOTE — ED Triage Notes (Signed)
Pt with recent knee surgery on 2/16, PT concerned pt with DVT. Pt with lower left leg pain that radiates up to inner thigh. Feels "tighter" since yesterday per pt.

## 2022-05-24 NOTE — ED Notes (Signed)
Patient transported to Ultrasound 

## 2022-05-24 NOTE — Telephone Encounter (Signed)
Received VM from patient stating she was heading to the ED b/c her OPPT did not like the look of the leg with regards to swelling. Currently at [facility] and is getting U/S to rule out DVT.

## 2022-05-25 ENCOUNTER — Telehealth: Payer: Self-pay | Admitting: *Deleted

## 2022-05-25 ENCOUNTER — Ambulatory Visit (INDEPENDENT_AMBULATORY_CARE_PROVIDER_SITE_OTHER): Payer: Medicare PPO

## 2022-05-25 ENCOUNTER — Encounter: Payer: Self-pay | Admitting: Radiology

## 2022-05-25 ENCOUNTER — Ambulatory Visit (INDEPENDENT_AMBULATORY_CARE_PROVIDER_SITE_OTHER): Payer: Medicare PPO | Admitting: Orthopaedic Surgery

## 2022-05-25 ENCOUNTER — Encounter: Payer: Self-pay | Admitting: Orthopaedic Surgery

## 2022-05-25 DIAGNOSIS — Z96652 Presence of left artificial knee joint: Secondary | ICD-10-CM | POA: Diagnosis not present

## 2022-05-25 MED ORDER — RIVAROXABAN 20 MG PO TABS: 20.0000 mg | ORAL_TABLET | Freq: Every day | ORAL | 3 refills | Status: DC

## 2022-05-25 MED ORDER — OXYCODONE HCL 5 MG PO TABS: 5.0000 mg | ORAL_TABLET | Freq: Four times a day (QID) | ORAL | 0 refills | Status: DC | PRN

## 2022-05-25 NOTE — Telephone Encounter (Signed)
Ortho bundle in office visit. Called to [facility]. Updated on visit today and will currently hold therapy per Dr. Ninfa Linden. Will call back and make new referral if needed.

## 2022-05-25 NOTE — Progress Notes (Signed)
The patient will be 3 weeks tomorrow status post a left total knee arthroplasty.  She did have an ultrasound DVT screen this week which was positive for small DVT says she is on the Xarelto starter pack.  We wanted to see her today because of the valgus malalignment of that knee when she stands.  On my exam with her laying supine when I completely extend her knee and pushed down on it it is much more straight.  However when she does stand she is in a significant valgus position of the knee.  Some of this may be hip related as well.  On exam she lacks at least 5 degrees of full extension but I can flex her to past 90 degrees.  There was no instability on varus and valgus stressing at 0 degrees extension or at 30 degrees of flexion.  There was no plate and drawer sign.  A standing AP and lateral of the left knee shows that the implants are well-seated but her leg definitely past the implant goes into a valgus malaligned position.  The actual components though appear to be in a good position.  I am hoping with swelling decreasing and time that the alignment will improve.  This is a little bit confusing to me based on what I am saying with her stand.  For now would like to try hinged knee brace and cannot have her go through therapy.  I would like to see her back in 3 weeks for repeat exam but no x-rays are needed.  We will refill her pain medicine and send in more Xarelto for when she is done with the starter pack.  Will continue to follow her closely.  Obviously if there is an injury and instability issues we would end up recommending further surgery but hopefully that will not be the case.

## 2022-05-25 NOTE — Telephone Encounter (Signed)
Patient's husband called this morning and we discussed the + U/S for a DVT in the Left surgical leg. She was started on Xarelto from the ED. She is very upset he states and wanted to be seen by you. She has also had several therapist mention how misaligned the knee seems and she is concerned this will cause more surgery. She had a lot of questions, so I thought it best to discuss with you directly. She has a f/u this afternoon scheduled. Just wanted to make you aware.

## 2022-06-15 ENCOUNTER — Encounter: Payer: Self-pay | Admitting: Orthopaedic Surgery

## 2022-06-15 ENCOUNTER — Ambulatory Visit (INDEPENDENT_AMBULATORY_CARE_PROVIDER_SITE_OTHER): Payer: Medicare PPO | Admitting: Orthopaedic Surgery

## 2022-06-15 DIAGNOSIS — Z96652 Presence of left artificial knee joint: Secondary | ICD-10-CM

## 2022-06-15 MED ORDER — METHOCARBAMOL 500 MG PO TABS: 500.0000 mg | ORAL_TABLET | Freq: Four times a day (QID) | ORAL | 1 refills | Status: DC | PRN

## 2022-06-15 MED ORDER — OXYCODONE HCL 5 MG PO TABS: 5.0000 mg | ORAL_TABLET | Freq: Four times a day (QID) | ORAL | 0 refills | Status: DC | PRN

## 2022-06-15 NOTE — Progress Notes (Signed)
The patient is now about 6 weeks out from a left total knee arthroplasty.  Her postoperative course has been complicated by DVT.  She is on Xarelto now.  Another complicating features is her knee and slight valgus position when she stands.  The knee does not feel ligamentously stable my exam and all and her flexion is entirely full.  She lacks full extension by about 3 to 5 degrees.  There is swelling to be expected her incisions well-healed.  She still has some pain in her calf.  Her motor and sensory exam is normal in her foot.  When I have her stand and bring her legs together the valgus is less noticeable in terms of the alignment.  I would have her concentrate on quad strengthening exercises and when she walks try and keep her legs together little bit more and standing up straight and putting her toes forward.  I would like to see her back in 4 weeks.  I did refill her pain medication and her muscle relaxant.  In 4 weeks I would like a standing AP and lateral of her right knee and on make sure when she is standing that she stands up straight for those x-rays and has both feet with toes pointing forward.  I do feel that worst-case scenario would be having to change out the polyliner given some of the valgus alignment of the knee but I am hoping between time and therapy and strengthen the knee this will help improve the alignment.

## 2022-07-13 ENCOUNTER — Telehealth: Payer: Self-pay

## 2022-07-13 NOTE — Telephone Encounter (Signed)
My Chart message sent to patient:  Michelle Foster,    I have been working on my Prolia orders and saw where your last injection was 01/03/2021. Do you still want to do the Prolia? If so, I will order it for you. Just let me know. Thank you!  Germaine Pomfret

## 2022-07-17 ENCOUNTER — Ambulatory Visit (INDEPENDENT_AMBULATORY_CARE_PROVIDER_SITE_OTHER): Payer: Medicare PPO | Admitting: Orthopaedic Surgery

## 2022-07-17 ENCOUNTER — Encounter: Payer: Self-pay | Admitting: Orthopaedic Surgery

## 2022-07-17 ENCOUNTER — Other Ambulatory Visit: Payer: Self-pay | Admitting: *Deleted

## 2022-07-17 ENCOUNTER — Other Ambulatory Visit: Payer: Self-pay

## 2022-07-17 ENCOUNTER — Other Ambulatory Visit (INDEPENDENT_AMBULATORY_CARE_PROVIDER_SITE_OTHER): Payer: Medicare PPO

## 2022-07-17 DIAGNOSIS — Z96652 Presence of left artificial knee joint: Secondary | ICD-10-CM

## 2022-07-17 DIAGNOSIS — R600 Localized edema: Secondary | ICD-10-CM

## 2022-07-17 DIAGNOSIS — I80202 Phlebitis and thrombophlebitis of unspecified deep vessels of left lower extremity: Secondary | ICD-10-CM

## 2022-07-17 MED ORDER — TEMAZEPAM 7.5 MG PO CAPS: 7.5000 mg | ORAL_CAPSULE | Freq: Every evening | ORAL | 0 refills | Status: DC | PRN

## 2022-07-17 MED ORDER — RIVAROXABAN 20 MG PO TABS: 20.0000 mg | ORAL_TABLET | Freq: Every day | ORAL | 0 refills | Status: DC

## 2022-07-17 NOTE — Progress Notes (Signed)
.    The patient is a 72 year old female who is now 10 weeks status post a left total knee arthroplasty.  Her postoperative course was complicated from a DVT.  She is on Xarelto.  She still has some varus malalignment of that knee when she stands on the left side.  X-rays of the left knee show well-seated total knee arthroplasty.  When she walks she does have slight varus malalignment as well but the replacement itself does not look malaligned.  This may be some weakness in the collateral ligaments but certainly could be related to the implant itself.  We need to repeat her Doppler ultrasound in 2 weeks to assess the DVT.  Will continue her Xarelto.  She will work on Dance movement psychotherapist exercises.  Of note her knee range of motion is entirely full which is great.  She does have swelling to be expected and overall things look well but we need her to have a better gait and to address this down the road once we know that her DVT has dissolved.  Will see her back in 4 weeks at but no x-rays are needed.  Hopefully the ultrasound will be done in 2 weeks.

## 2022-07-18 ENCOUNTER — Other Ambulatory Visit: Payer: Self-pay | Admitting: Orthopaedic Surgery

## 2022-07-18 ENCOUNTER — Telehealth: Payer: Self-pay | Admitting: Orthopaedic Surgery

## 2022-07-18 ENCOUNTER — Telehealth: Payer: Self-pay

## 2022-07-18 MED ORDER — RIVAROXABAN 20 MG PO TABS: 20.0000 mg | ORAL_TABLET | Freq: Every day | ORAL | 3 refills | Status: DC

## 2022-07-18 MED ORDER — TRAZODONE HCL 50 MG PO TABS: 50.0000 mg | ORAL_TABLET | Freq: Every evening | ORAL | 0 refills | Status: DC | PRN

## 2022-07-18 MED ORDER — TEMAZEPAM 7.5 MG PO CAPS: 7.5000 mg | ORAL_CAPSULE | Freq: Every evening | ORAL | 0 refills | Status: DC | PRN

## 2022-07-18 NOTE — Telephone Encounter (Signed)
Suggested alternatives for patients Temazepam per insurance are Trazodone 50mg  tablet, Zolpidem Tartrate 5mg  tablet, or Belsomra 5mg  tablet

## 2022-07-18 NOTE — Telephone Encounter (Signed)
Patient advising that [facility] [place] due to the [place] [facility] is [facility]. Patient states she want it called in to [facility][phone]( [place] [facility]) also pleas make any corrections prior to calling it in. And advise patient when done. She is going out of town.

## 2022-08-01 ENCOUNTER — Ambulatory Visit (HOSPITAL_COMMUNITY)
Admission: RE | Admit: 2022-08-01 | Discharge: 2022-08-01 | Disposition: A | Discharge status: Home / Self Care | Payer: Medicare PPO | Source: Ambulatory Visit | Attending: Orthopaedic Surgery | Admitting: Orthopaedic Surgery

## 2022-08-01 DIAGNOSIS — R600 Localized edema: Secondary | ICD-10-CM | POA: Diagnosis not present

## 2022-08-01 DIAGNOSIS — Z96652 Presence of left artificial knee joint: Secondary | ICD-10-CM | POA: Insufficient documentation

## 2022-08-02 ENCOUNTER — Other Ambulatory Visit: Payer: Self-pay

## 2022-08-02 ENCOUNTER — Telehealth: Payer: Self-pay

## 2022-08-02 DIAGNOSIS — M810 Age-related osteoporosis without current pathological fracture: Secondary | ICD-10-CM

## 2022-08-02 NOTE — Progress Notes (Signed)
   Care Guide Note  08/02/2022 [name] MRN: [id number] DOB: 1950-10-08  Referred by: Donita Brooks, MD Reason for referral : Care Coordination (Outreach to schedule with Pharm d )   Michelle Foster is a 72 y.o. year old female who is a primary care patient of Tanya Nones, Priscille Heidelberg, MD. Michelle Foster was referred to the pharmacist for assistance related to  Osteoperosis  .    Successful contact was made with the patient to discuss pharmacy services including being ready for the pharmacist to call at least 5 minutes before the scheduled appointment time, to have medication bottles and any blood sugar or blood pressure readings ready for review. The patient agreed to meet with the pharmacist via with the pharmacist via telephone visit on (date/time).  08/16/2022  [name], RMA Care Guide [facility]  [place], [place] [address] Direct Dial: [phone] [email or web address]

## 2022-08-16 ENCOUNTER — Other Ambulatory Visit: Payer: Medicare PPO | Admitting: Pharmacist

## 2022-08-16 ENCOUNTER — Encounter: Payer: Medicare PPO | Admitting: Orthopaedic Surgery

## 2022-08-16 NOTE — Progress Notes (Signed)
   08/16/2022 [name] MRN: [id number] DOB: 11-Dec-1950  Chief Complaint  Patient presents with   Osteoporosis    Unsuccessful outreach to patient x2.  Voicemail was left encouraging return call.  Patient visit re: Prolia.   Kieth Brightly, PharmD, BCACP Clinical Pharmacist, [facility]

## 2022-08-17 DIAGNOSIS — Z96652 Presence of left artificial knee joint: Secondary | ICD-10-CM | POA: Diagnosis not present

## 2022-09-18 ENCOUNTER — Telehealth: Payer: Self-pay

## 2022-09-18 NOTE — Progress Notes (Signed)
   Care Guide Note  09/18/2022 [name] MRN: [id number] DOB: [birth year]  Referred by: Donita Brooks, MD Reason for referral : Care Coordination (Outreach to reschedule with Pharm d )   [name] is a 71 y.o. year old female who is a primary care patient of Tanya Nones, Priscille Heidelberg, MD. [name] was referred to the pharmacist for assistance related to  osteoperosis .    Successful contact was made with the patient to discuss pharmacy services including being ready for the pharmacist to call at least 5 minutes before the scheduled appointment time, to have medication bottles and any blood sugar or blood pressure readings ready for review. The patient agreed to meet with the pharmacist via with the pharmacist via telephone visit on (date/time).  10/04/2022  [name], RMA Care Guide [facility]  [place], [place] [address] Direct Dial: [phone] [name]

## 2022-10-04 ENCOUNTER — Other Ambulatory Visit: Payer: Medicare PPO | Admitting: Pharmacist

## 2022-10-04 NOTE — Progress Notes (Signed)
   10/04/2022 [name] MRN: [id number] DOB: [birth year]  Chief Complaint  Patient presents with   Osteoporosis    Michelle Foster is a 72 y.o. year old female who presented for a telephone visit.   They were referred to the pharmacist by their PCP for assistance in managing  osteoporosis .    IMPRESSION: Referring Physician: Donita Foster Your patient completed a bone mineral density test using GE Lunar PATIENT: [name], Michelle Foster Patient ID: [id number] Birth Date: May 14, 1950 Height: 63.5 in. Sex: Female Measured: 09/24/2020 Weight: 149.4 lbs. Indications: Bilateral Ovariectomy (65.51), Caucasian, Estrogen Deficient, History of Osteoporosis, Hysterectomy, Postmenopausal Fractures: None Treatments: None, Prolia  ASSESSMENT: The BMD measured at Femur Neck Right is 0.696 g/cm2 with a T-score of -2.5. This patient is considered osteoporotic according to World Health Organization Michelle Foster) criteria.  The quality of the exam is good. L3 was excluded due to degenerative changes.  Site Region Measured Date Measured Age YA BMD Significant CHANGE T-score AP Spine L1-L4 (L3) 09/24/2020 69.8 -2.3 0.888 g/cm2 * AP Spine L1-L4 (L3) 08/27/2017 66.7 -2.7 0.840 g/cm2  DualFemur Neck Right 09/24/2020 69.8 -2.5 0.696 g/cm2 DualFemur Neck Right 08/27/2017 66.7 -2.6 0.673 g/cm2  DualFemur Total Mean 09/24/2020 69.8 -2.1 0.740 g/cm2 * DualFemur Total Mean 08/27/2017 66.7 -2.5 0.699 g/cm2  World Health Organization Michelle Foster) criteria for post-menopausal, Caucasian Women:   Assessment/Plan:   Osteoporosis: - Patient is due for DEXA now (last DEXA 09/24/20) - She has not been on Prolia since 2022;  She has upcoming ortho (total knee) surgery on 8/21/204 (may need to clear restart of Prolia with Dr. Despina Foster - Reviewed recommendation for daily vitamin D intake of [phone] units - Calcium has been elevated in the past; patient was advised by PCP to hold calcium supplements -  Reviewed benefits of weight bearing exercise - Message sent to RN to assist with Prolia  Michelle Foster, PharmD, BCACP Clinical Pharmacist, [facility]

## 2022-10-05 DIAGNOSIS — G245 Blepharospasm: Secondary | ICD-10-CM | POA: Diagnosis not present

## 2022-10-19 ENCOUNTER — Telehealth: Payer: Self-pay

## 2022-10-19 DIAGNOSIS — U071 COVID-19: Secondary | ICD-10-CM

## 2022-10-19 HISTORY — DX: COVID-19: U07.1

## 2022-10-19 NOTE — Telephone Encounter (Signed)
LM for pt to call office to discuss. Mjp,lpn  Osteoporosis: - Patient is due for DEXA now (last DEXA 09/24/20) - She has not been on Prolia since 2022;  She has upcoming ortho (total knee) surgery on 8/21/204 (may need to clear restart of Prolia with Dr. Despina Hick - Reviewed recommendation for daily vitamin D intake of [phone] units - Calcium has been elevated in the past; patient was advised by PCP to hold calcium supplements - Reviewed benefits of weight bearing exercise - Message sent to RN to assist with Prolia   Kieth Brightly, PharmD, BCACP Clinical Pharmacist, [facility]

## 2022-10-20 ENCOUNTER — Other Ambulatory Visit: Payer: Self-pay

## 2022-10-20 ENCOUNTER — Encounter (HOSPITAL_COMMUNITY): Payer: Self-pay

## 2022-10-20 ENCOUNTER — Telehealth: Payer: Self-pay | Admitting: Family Medicine

## 2022-10-20 ENCOUNTER — Other Ambulatory Visit: Payer: Self-pay | Admitting: Family Medicine

## 2022-10-20 MED ORDER — NIRMATRELVIR/RITONAVIR (PAXLOVID)TABLET: 3.0000 | ORAL_TABLET | Freq: Two times a day (BID) | ORAL | 0 refills | Status: DC

## 2022-10-20 NOTE — Patient Instructions (Signed)
SURGICAL WAITING ROOM VISITATION Patients having surgery or a procedure may have no more than 2 support people in the waiting area - these visitors may rotate.    Children under the age of 64 must have an adult with them who is not the patient.  If the patient needs to stay at the hospital during part of their recovery, the visitor guidelines for inpatient rooms apply. Pre-op nurse will coordinate an appropriate time for 1 support person to accompany patient in pre-op.  This support person may not rotate.    Please refer to the [facility] website for the visitor guidelines for Inpatients (after your surgery is over and you are in a regular room).       Your procedure is scheduled on: 11-08-22   Report to [facility] Main Entrance    Report to admitting at 9:30 AM   Call this number if you have problems the morning of surgery [phone]   Do not eat food :After Midnight.   After Midnight you may have the following liquids until 9:00 AM DAY OF SURGERY  Water Non-Citrus Juices (without pulp, NO RED-Apple, White grape, White cranberry) Black Coffee (NO MILK/CREAM OR CREAMERS, sugar ok)  Clear Tea (NO MILK/CREAM OR CREAMERS, sugar ok) regular and decaf                             Plain Jell-O (NO RED)                                           Fruit ices (not with fruit pulp, NO RED)                                     Popsicles (NO RED)                                                               Sports drinks like Gatorade (NO RED)                   The day of surgery:  Drink ONE (1) Pre-Surgery G2 at 9:00 AM the morning of surgery. Drink in one sitting. Do not sip.  This drink was given to you during your hospital  pre-op appointment visit. Nothing else to drink after completing the Pre-Surgery G2.          If you have questions, please contact your surgeon's office.   FOLLOW ANY ADDITIONAL PRE OP INSTRUCTIONS YOU RECEIVED FROM YOUR SURGEON'S OFFICE!!!     Oral  Hygiene is also important to reduce your risk of infection.                                    Remember - BRUSH YOUR TEETH THE MORNING OF SURGERY WITH YOUR REGULAR TOOTHPASTE   Do NOT smoke after Midnight   Take these medicines the morning of surgery with A SIP OF WATER:   Zyrtec  Rosuvastatin  Tylenol if needed  Stop all vitamins and herbal supplements 7 days before surgery                              You may not have any metal on your body including hair pins, jewelry, and body piercing             Do not wear make-up, lotions, powders, perfumes or deodorant  Do not wear nail polish including gel and S&S, artificial/acrylic nails, or any other type of covering on natural nails including finger and toenails. If you have artificial nails, gel coating, etc. that needs to be removed by a nail salon please have this removed prior to surgery or surgery may need to be canceled/ delayed if the surgeon/ anesthesia feels like they are unable to be safely monitored.   Do not shave  48 hours prior to surgery.          Do not bring valuables to the hospital. [facility] IS NOT RESPONSIBLE   FOR VALUABLES.   Contacts, dentures or bridgework may not be worn into surgery.   Bring small overnight bag day of surgery.   DO NOT BRING YOUR HOME MEDICATIONS TO THE HOSPITAL. PHARMACY WILL DISPENSE MEDICATIONS LISTED ON YOUR MEDICATION LIST TO YOU DURING YOUR ADMISSION IN THE HOSPITAL!   Special Instructions: Bring a copy of your healthcare power of attorney and living will documents the day of surgery if you haven't scanned them before.              Please read over the following fact sheets you were given: IF YOU HAVE QUESTIONS ABOUT YOUR PRE-OP INSTRUCTIONS PLEASE CALL [phone] Gwen  If you received a COVID test during your pre-op visit  it is requested that you wear a mask when out in public, stay away from anyone that may not be feeling well and notify your surgeon if you develop symptoms. If  you test positive for Covid or have been in contact with anyone that has tested positive in the last 10 days please notify you surgeon.    Pre-operative 5 CHG Bath Instructions   You can play a key role in reducing the risk of infection after surgery. Your skin needs to be as free of germs as possible. You can reduce the number of germs on your skin by washing with CHG (chlorhexidine gluconate) soap before surgery. CHG is an antiseptic soap that kills germs and continues to kill germs even after washing.   DO NOT use if you have an allergy to chlorhexidine/CHG or antibacterial soaps. If your skin becomes reddened or irritated, stop using the CHG and notify one of our RNs at  [phone] .   Please shower with the CHG soap starting 4 days before surgery using the following schedule:     Please keep in mind the following:  DO NOT shave, including legs and underarms, starting the day of your first shower.   You may shave your face at any point before/day of surgery.  Place clean sheets on your bed the day you start using CHG soap. Use a clean washcloth (not used since being washed) for each shower. DO NOT sleep with pets once you start using the CHG.   CHG Shower Instructions:  If you choose to wash your hair and private area, wash first with your normal shampoo/soap.  After you use shampoo/soap, rinse your hair and body thoroughly to remove shampoo/soap residue.  Turn the  water OFF and apply about 3 tablespoons (45 ml) of CHG soap to a CLEAN washcloth.  Apply CHG soap ONLY FROM YOUR NECK DOWN TO YOUR TOES (washing for 3-5 minutes)  DO NOT use CHG soap on face, private areas, open wounds, or sores.  Pay special attention to the area where your surgery is being performed.  If you are having back surgery, having someone wash your back for you may be helpful. Wait 2 minutes after CHG soap is applied, then you may rinse off the CHG soap.  Pat dry with a clean towel  Put on clean  clothes/pajamas   If you choose to wear lotion, please use ONLY the CHG-compatible lotions on the back of this paper.     Additional instructions for the day of surgery: DO NOT APPLY any lotions, deodorants, cologne, or perfumes.   Put on clean/comfortable clothes.  Brush your teeth.  Ask your nurse before applying any prescription medications to the skin.      CHG Compatible Lotions   Aveeno Moisturizing lotion  Cetaphil Moisturizing Cream  Cetaphil Moisturizing Lotion  Clairol Herbal Essence Moisturizing Lotion, Dry Skin  Clairol Herbal Essence Moisturizing Lotion, Extra Dry Skin  Clairol Herbal Essence Moisturizing Lotion, Normal Skin  Curel Age Defying Therapeutic Moisturizing Lotion with Alpha Hydroxy  Curel Extreme Care Body Lotion  Curel Soothing Hands Moisturizing Hand Lotion  Curel Therapeutic Moisturizing Cream, Fragrance-Free  Curel Therapeutic Moisturizing Lotion, Fragrance-Free  Curel Therapeutic Moisturizing Lotion, Original Formula  Eucerin Daily Replenishing Lotion  Eucerin Dry Skin Therapy Plus Alpha Hydroxy Crme  Eucerin Dry Skin Therapy Plus Alpha Hydroxy Lotion  Eucerin Original Crme  Eucerin Original Lotion  Eucerin Plus Crme Eucerin Plus Lotion  Eucerin TriLipid Replenishing Lotion  Keri Anti-Bacterial Hand Lotion  Keri Deep Conditioning Original Lotion Dry Skin Formula Softly Scented  Keri Deep Conditioning Original Lotion, Fragrance Free Sensitive Skin Formula  Keri Lotion Fast Absorbing Fragrance Free Sensitive Skin Formula  Keri Lotion Fast Absorbing Softly Scented Dry Skin Formula  Keri Original Lotion  Keri Skin Renewal Lotion Keri Silky Smooth Lotion  Keri Silky Smooth Sensitive Skin Lotion  Nivea Body Creamy Conditioning Oil  Nivea Body Extra Enriched Lotion  Nivea Body Original Lotion  Nivea Body Sheer Moisturizing Lotion Nivea Crme  Nivea Skin Firming Lotion  NutraDerm 30 Skin Lotion  NutraDerm Skin Lotion  NutraDerm Therapeutic  Skin Cream  NutraDerm Therapeutic Skin Lotion  ProShield Protective Hand Cream  Provon moisturizing lotion   PATIENT SIGNATURE_________________________________  NURSE SIGNATURE__________________________________  ________________________________________________________________________    Rogelia Mire  An incentive spirometer is a tool that can help keep your lungs clear and active. This tool measures how well you are filling your lungs with each breath. Taking long deep breaths may help reverse or decrease the chance of developing breathing (pulmonary) problems (especially infection) following: A long period of time when you are unable to move or be active. BEFORE THE PROCEDURE  If the spirometer includes an indicator to show your best effort, your nurse or respiratory therapist will set it to a desired goal. If possible, sit up straight or lean slightly forward. Try not to slouch. Hold the incentive spirometer in an upright position. INSTRUCTIONS FOR USE  Sit on the edge of your bed if possible, or sit up as far as you can in bed or on a chair. Hold the incentive spirometer in an upright position. Breathe out normally. Place the mouthpiece in your mouth and seal your lips tightly around it. Breathe  in slowly and as deeply as possible, raising the piston or the ball toward the top of the column. Hold your breath for 3-5 seconds or for as long as possible. Allow the piston or ball to fall to the bottom of the column. Remove the mouthpiece from your mouth and breathe out normally. Rest for a few seconds and repeat Steps 1 through 7 at least 10 times every 1-2 hours when you are awake. Take your time and take a few normal breaths between deep breaths. The spirometer may include an indicator to show your best effort. Use the indicator as a goal to work toward during each repetition. After each set of 10 deep breaths, practice coughing to be sure your lungs are clear. If you have an  incision (the cut made at the time of surgery), support your incision when coughing by placing a pillow or rolled up towels firmly against it. Once you are able to get out of bed, walk around indoors and cough well. You may stop using the incentive spirometer when instructed by your caregiver.  RISKS AND COMPLICATIONS Take your time so you do not get dizzy or light-headed. If you are in pain, you may need to take or ask for pain medication before doing incentive spirometry. It is harder to take a deep breath if you are having pain. AFTER USE Rest and breathe slowly and easily. It can be helpful to keep track of a log of your progress. Your caregiver can provide you with a simple table to help with this. If you are using the spirometer at home, follow these instructions: SEEK MEDICAL CARE IF:  You are having difficultly using the spirometer. You have trouble using the spirometer as often as instructed. Your pain medication is not giving enough relief while using the spirometer. You develop fever of 100.5 F (38.1 C) or higher. SEEK IMMEDIATE MEDICAL CARE IF:  You cough up bloody sputum that had not been present before. You develop fever of 102 F (38.9 C) or greater. You develop worsening pain at or near the incision site. MAKE SURE YOU:  Understand these instructions. Will watch your condition. Will get help right away if you are not doing well or get worse. Document Released: 07/17/2006 Document Revised: 05/29/2011 Document Reviewed: 09/17/2006 ExitCare Patient Information 2014 ExitCare, [place].   ________________________________________________________________________ WHAT IS A BLOOD TRANSFUSION? Blood Transfusion Information  A transfusion is the replacement of blood or some of its parts. Blood is made up of multiple cells which provide different functions. Red blood cells carry oxygen and are used for blood loss replacement. White blood cells fight against infection. Platelets  control bleeding. Plasma helps clot blood. Other blood products are available for specialized needs, such as hemophilia or other clotting disorders. BEFORE THE TRANSFUSION  Who gives blood for transfusions?  Healthy volunteers who are fully evaluated to make sure their blood is safe. This is blood bank blood. Transfusion therapy is the safest it has ever been in the practice of medicine. Before blood is taken from a donor, a complete history is taken to make sure that person has no history of diseases nor engages in risky social behavior (examples are intravenous drug use or sexual activity with multiple partners). The donor's travel history is screened to minimize risk of transmitting infections, such as malaria. The donated blood is tested for signs of infectious diseases, such as HIV and hepatitis. The blood is then tested to be sure it is compatible with you in order to  minimize the chance of a transfusion reaction. If you or a relative donates blood, this is often done in anticipation of surgery and is not appropriate for emergency situations. It takes many days to process the donated blood. RISKS AND COMPLICATIONS Although transfusion therapy is very safe and saves many lives, the main dangers of transfusion include:  Getting an infectious disease. Developing a transfusion reaction. This is an allergic reaction to something in the blood you were given. Every precaution is taken to prevent this. The decision to have a blood transfusion has been considered carefully by your caregiver before blood is given. Blood is not given unless the benefits outweigh the risks. AFTER THE TRANSFUSION Right after receiving a blood transfusion, you will usually feel much better and more energetic. This is especially true if your red blood cells have gotten low (anemic). The transfusion raises the level of the red blood cells which carry oxygen, and this usually causes an energy increase. The nurse administering the  transfusion will monitor you carefully for complications. HOME CARE INSTRUCTIONS  No special instructions are needed after a transfusion. You may find your energy is better. Speak with your caregiver about any limitations on activity for underlying diseases you may have. SEEK MEDICAL CARE IF:  Your condition is not improving after your transfusion. You develop redness or irritation at the intravenous (IV) site. SEEK IMMEDIATE MEDICAL CARE IF:  Any of the following symptoms occur over the next 12 hours: Shaking chills. You have a temperature by mouth above 102 F (38.9 C), not controlled by medicine. Chest, back, or muscle pain. People around you feel you are not acting correctly or are confused. Shortness of breath or difficulty breathing. Dizziness and fainting. You get a rash or develop hives. You have a decrease in urine output. Your urine turns a dark color or changes to pink, red, or brown. Any of the following symptoms occur over the next 10 days: You have a temperature by mouth above 102 F (38.9 C), not controlled by medicine. Shortness of breath. Weakness after normal activity. The white part of the eye turns yellow (jaundice). You have a decrease in the amount of urine or are urinating less often. Your urine turns a dark color or changes to pink, red, or brown. Document Released: 03/03/2000 Document Revised: 05/29/2011 Document Reviewed: 10/21/2007 [facility] Patient Information 2014 [place], [place].  _______________________________________________________________________

## 2022-10-20 NOTE — Progress Notes (Signed)
COVID Vaccine Completed:  Yes  Date of COVID positive in last 90 days:  10-19-22  PCP - Lynnea Ferrier, MD Cardiologist - Peter Swazil[place], MD  Medical clearance on chart dated 09-18-22  Chest x-ray - N/A EKG - 10-26-22 Epic Stress Test - 04-03-17 Epic ECHO - 04-02-17 Epic Cardiac Cath - 1995 in [facility] Pacemaker/ICD device last checked: Spinal Cord Stimulator:  N/A  Bowel Prep - N/A  Sleep Study - N/A CPAP -   Prediabetes Fasting Blood Sugar -  Checks Blood Sugar - does not check   Last dose of GLP1 agonist-  N/A GLP1 instructions:  N/A   Last dose of SGLT-2 inhibitors-  N/A SGLT-2 instructions: N/A   Blood Thinner Instructions:N/A Aspirin Instructions: Last Dose:  Activity level:  Can go up a flight of stairs and perform activities of daily living without stopping and without symptoms of chest pain or shortness of breath.  Anesthesia review: Hx of chest pain, DOE and labile HTN evaluated by cardiology.  Pt denies any recent episodes of chest pain or DOE.  Hx of Covid 10-19-22.  Pt states that all symptoms resolved on 10-21-22.    Patient denies shortness of breath, fever, cough and chest pain at PAT appointment  Patient verbalized understanding of instructions that were given to them at the PAT appointment. Patient was also instructed that they will need to review over the PAT instructions again at home before surgery.

## 2022-10-20 NOTE — Telephone Encounter (Signed)
Patient tested positive for COVID yesterday at 5:30pm. Sx: congestion, low grade fever, sore throat, body aches, cough, insomnia, headache, sneezing  Requesting for provider to call in PAXLOVID.  Pharmacy confirmed as  [id number] [place], [place] - [address] [facility] [address] [place] [address] Phone: [phone]  Fax: [phone] DEA #: [id number]   Please advise at [phone].

## 2022-10-23 ENCOUNTER — Other Ambulatory Visit: Payer: Self-pay

## 2022-10-23 DIAGNOSIS — Z13820 Encounter for screening for osteoporosis: Secondary | ICD-10-CM

## 2022-10-23 DIAGNOSIS — Z1239 Encounter for other screening for malignant neoplasm of breast: Secondary | ICD-10-CM

## 2022-10-23 NOTE — Telephone Encounter (Signed)
Called and spoke w/pt this morning. Pt is aware that she is due for the following: Mammo/Dexa-orders has been placed 10/23/22.  Pt also has an appt w/pcp on 10/24/22 f/u on osteoporosis to f/u on starting Prolia again and other health concerns.

## 2022-10-24 ENCOUNTER — Encounter (HOSPITAL_COMMUNITY): Payer: Self-pay

## 2022-10-24 ENCOUNTER — Encounter: Payer: Self-pay | Admitting: Family Medicine

## 2022-10-24 ENCOUNTER — Ambulatory Visit (INDEPENDENT_AMBULATORY_CARE_PROVIDER_SITE_OTHER): Payer: Medicare PPO | Admitting: Family Medicine

## 2022-10-24 VITALS — BP 120/70 | HR 83 | Temp 97.8°F | Ht 63.5 in | Wt 149.0 lb

## 2022-10-24 DIAGNOSIS — M810 Age-related osteoporosis without current pathological fracture: Secondary | ICD-10-CM | POA: Diagnosis not present

## 2022-10-24 DIAGNOSIS — Z1231 Encounter for screening mammogram for malignant neoplasm of breast: Secondary | ICD-10-CM

## 2022-10-24 DIAGNOSIS — E7800 Pure hypercholesterolemia, unspecified: Secondary | ICD-10-CM | POA: Diagnosis not present

## 2022-10-24 NOTE — Progress Notes (Signed)
Subjective:    Patient ID: Michelle Foster, female    DOB: 1950/05/02, 72 y.o.   MRN: [id number]  HPI Patient is here today for regular checkup.  She has a history of hyperlipidemia.  She has been taking rosuvastatin daily.  She denies any myalgias.  She denies any right upper quadrant pain.  She is due to recheck her cholesterol and fasting lab work.  She also has a history of osteopenia porosis.  She is not currently receiving any treatment for that.  She would like to repeat her bone density before considering Prolia.  She is also due for her mammogram. 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,    Colon polyps    COVID-19 10/19/2022   Estrogen deficiency    Eustachian tube dysfunction    History of kidney stones    Hyperlipidemia    no meds   Hypertension    Osteoporosis    Pre-diabetes    Past Surgical History:  Procedure Laterality Date   ABDOMINAL HYSTERECTOMY     TAH BSO by accident at 26    bilateral cataract surgery      BREAST BIOPSY Right 01/2021   COLONOSCOPY  2012   done in [place], physician has since retired   COLONOSCOPY WITH PROPOFOL N/A 11/13/2018   Procedure: COLONOSCOPY WITH PROPOFOL;  Surgeon: Lemar Lofty., MD;  Location: [facility];  Service: Gastroenterology;  Laterality: N/A;  NEEDS TO BE A 90 MIN SPOT   left knee meniscus tear      meniscal tear     right   OOPHORECTOMY     OVARY SURGERY     1982   POLYPECTOMY  11/13/2018   Procedure: POLYPECTOMY;  Surgeon: Meridee Score Netty Starring., MD;  Location: [facility];  Service: Gastroenterology;;   TOTAL KNEE ARTHROPLASTY Left 05/05/2022   Procedure: LEFT TOTAL KNEE ARTHROPLASTY;  Surgeon: Kathryne Hitch, MD;  Location: [facility];  Service: Orthopedics;  Laterality: Left;   ureteral tumor removal     Current Outpatient  Medications on File Prior to Visit  Medication Sig Dispense Refill   acetaminophen (TYLENOL) 500 MG tablet Take 500-1,000 mg by mouth every 6 (six) hours as needed (pain.).     cetirizine (ZYRTEC) 10 MG tablet Take 10 mg by mouth daily as needed for allergies.     ketoconazole (NIZORAL) 2 % shampoo APPLY TOPICALLY TWICE A WEEK (Patient taking differently: Apply 1 Application topically See admin instructions. Use 3 times monthly) 120 mL 1   rosuvastatin (CRESTOR) 20 MG tablet TAKE 1 TABLET BY MOUTH EVERY DAY (Patient taking differently: Take 20 mg by mouth 3 (three) times a week.) 90 tablet 1   Current Facility-Administered Medications on File Prior to Visit  Medication Dose Route Frequency Provider Last Rate Last Admin   denosumab (PROLIA) injection 60 mg  60 mg Subcutaneous Q6 months Donita Brooks, MD   60 mg at 01/03/21 1537   Allergies  Allergen Reactions   Meperidine Hcl Shortness Of Breath   Codeine Nausea And Vomiting   Social History   Socioeconomic History   Marital status: Married    Spouse name: Not on file   Number of children: 1   Years of education: Not on file   Highest education level:  Not on file  Occupational History   Not on file  Tobacco Use   Smoking status: Never   Smokeless tobacco: Never  Vaping Use   Vaping status: Never Used  Substance and Sexual Activity   Alcohol use: No   Drug use: No   Sexual activity: Not on file  Other Topics Concern   Not on file  Social History Narrative   Retired   International aid/development worker of Health   Financial Resource Strain: Low Risk  (03/16/2022)   Overall Financial Resource Strain (CARDIA)    Difficulty of Paying Living Expenses: Not hard at all  Food Insecurity: No Food Insecurity (05/05/2022)   Hunger Vital Sign    Worried About Running Out of Food in the Last Year: Never true    Ran Out of Food in the Last Year: Never true  Transportation Needs: No Transportation Needs (05/05/2022)   PRAPARE - Therapist, art (Medical): No    Lack of Transportation (Non-Medical): No  Physical Activity: Inactive (03/16/2022)   Exercise Vital Sign    Days of Exercise per Week: 0 days    Minutes of Exercise per Session: 0 min  Stress: No Stress Concern Present (03/16/2022)   [employer] of Occupational Health - Occupational Stress Questionnaire    Feeling of Stress : Not at all  Social Connections: Moderately Integrated (03/16/2022)   Social Connection and Isolation Panel [NHANES]    Frequency of Communication with Friends and Family: More than three times a week    Frequency of Social Gatherings with Friends and Family: Three times a week    Attends Religious Services: More than 4 times per year    Active Member of Clubs or Organizations: No    Attends Banker Meetings: Never    Marital Status: Married  Catering manager Violence: Not At Risk (05/05/2022)   Humiliation, Afraid, Rape, and Kick questionnaire    Fear of Current or Ex-Partner: No    Emotionally Abused: No    Physically Abused: No    Sexually Abused: No     Review of Systems  All other systems reviewed and are negative.      Objective:   Physical Exam Vitals reviewed.  Constitutional:      Appearance: Normal appearance. She is normal weight.  Neck:     Vascular: No carotid bruit.  Cardiovascular:     Rate and Rhythm: Normal rate and regular rhythm.     Pulses: Normal pulses.     Heart sounds: Normal heart sounds. No murmur heard.    No friction rub. No gallop.  Pulmonary:     Effort: No respiratory distress.     Breath sounds: Normal breath sounds. No stridor. No wheezing, rhonchi or rales.  Musculoskeletal:     Right lower leg: No edema.     Left lower leg: No edema.  Neurological:     Mental Status: She is alert.           Assessment & Plan:  Osteoporosis, postmenopausal - Plan: DG Bone Density  Pure hypercholesterolemia - Plan: CBC with Differential/Platelet, COMPLETE  METABOLIC PANEL WITH GFR, Lipid panel  Encounter for screening mammogram for malignant neoplasm of breast - Plan: MM Digital Screening Blood pressure today is excellent.  I would like to check a CBC a CMP and lipid panel.  I like to keep her LDL cholesterol less than 161.  Repeat a bone density to monitor the progression of her osteoporosis.  If her T-score continues to demonstrate osteoporosis I would encourage her to consider Prolia.  Obtain a mammogram to screen for breast cancer

## 2022-10-24 NOTE — H&P (Signed)
TOTAL KNEE REVISION ADMISSION H&P  Patient is being admitted for left total knee arthroplasty revision.  Subjective:  Chief Complaint: Left knee pain.  HPI: Michelle Foster, 72 y.o. female presents for pre-operative visit in preparation for their left total knee revision arthroplasty, which is scheduled on 11/08/22 with Dr. Lequita Halt at [facility]. The patient has had symptoms in the left knee including pain and dysfunction which has impacted their quality of life and ability to do activities of daily living. The patient currently has a diagnosis of failed left total knee arthroplasty and has failed conservative treatments including activity modification and physical therapy. The patient has had previous arthroplasty on the left knee. The patient denies an active infection.  Patient Active Problem List   Diagnosis Date Noted   Status post total left knee replacement 05/05/2022   Dysuria 04/08/2020   Bilateral lower extremity edema 08/30/2018   Hyperlipidemia 03/30/2017   Atypical chest pain 03/30/2017   Dyspnea on exertion 03/30/2017    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,    Colon polyps    COVID-19 10/19/2022   Estrogen deficiency    Eustachian tube dysfunction    History of kidney stones    Hyperlipidemia    no meds   Hypertension    Osteoporosis    Pre-diabetes     Past Surgical History:  Procedure Laterality Date   ABDOMINAL HYSTERECTOMY     TAH BSO by accident at 4    bilateral cataract surgery      BREAST BIOPSY Right 01/2021   COLONOSCOPY  2012   done in [place], physician has since retired   COLONOSCOPY WITH PROPOFOL N/A 11/13/2018   Procedure: COLONOSCOPY WITH PROPOFOL;  Surgeon: Lemar Lofty., MD;  Location: [facility];  Service: Gastroenterology;  Laterality: N/A;  NEEDS  TO BE A 90 MIN SPOT   left knee meniscus tear      meniscal tear     right   OOPHORECTOMY     OVARY SURGERY     1982   POLYPECTOMY  11/13/2018   Procedure: POLYPECTOMY;  Surgeon: Meridee Score Netty Starring., MD;  Location: [facility];  Service: Gastroenterology;;   TOTAL KNEE ARTHROPLASTY Left 05/05/2022   Procedure: LEFT TOTAL KNEE ARTHROPLASTY;  Surgeon: Kathryne Hitch, MD;  Location: [facility];  Service: Orthopedics;  Laterality: Left;   ureteral tumor removal      Prior to Admission medications   Medication Sig Start Date End Date Taking? Authorizing Provider  acetaminophen (TYLENOL) 500 MG tablet Take 500-1,000 mg by mouth every 6 (six) hours as needed (pain.).   Yes [provider]  cetirizine (ZYRTEC) 10 MG tablet Take 10 mg by mouth daily as needed for allergies.   Yes [provider]  ketoconazole (NIZORAL) 2 % shampoo APPLY TOPICALLY TWICE A WEEK Patient taking differently: Apply 1 Application topically See admin instructions. Use 3 times monthly 04/19/20  Yes [place], Velna Hatchet, MD  rosuvastatin (CRESTOR) 20 MG tablet TAKE 1 TABLET BY MOUTH EVERY DAY Patient taking differently: Take 20 mg by mouth 3 (three) times a week. 01/16/22  Yes Donita Brooks, MD  nirmatrelvir/ritonavir (PAXLOVID) 20 x 150 MG & 10 x 100MG  TABS Take 3 tablets by mouth 2 (two) times daily for 5  days. (Take nirmatrelvir 150 mg two tablets twice daily for 5 days and ritonavir 100 mg one tablet twice daily for 5 days) 10/20/22 10/25/22  Donita Brooks, MD    Allergies  Allergen Reactions   Meperidine Hcl Shortness Of Breath   Codeine Nausea And Vomiting    Social History   Socioeconomic History   Marital status: Married    Spouse name: Not on file   Number of children: 1   Years of education: Not on file   Highest education level: Not on file  Occupational History   Not on file  Tobacco Use   Smoking status: Never   Smokeless tobacco: Never  Vaping Use   Vaping status:  Never Used  Substance and Sexual Activity   Alcohol use: No   Drug use: No   Sexual activity: Not on file  Other Topics Concern   Not on file  Social History Narrative   Retired   International aid/development worker of Health   Financial Resource Strain: Low Risk  (03/16/2022)   Overall Financial Resource Strain (CARDIA)    Difficulty of Paying Living Expenses: Not hard at all  Food Insecurity: No Food Insecurity (05/05/2022)   Hunger Vital Sign    Worried About Running Out of Food in the Last Year: Never true    Ran Out of Food in the Last Year: Never true  Transportation Needs: No Transportation Needs (05/05/2022)   PRAPARE - Administrator, Civil Service (Medical): No    Lack of Transportation (Non-Medical): No  Physical Activity: Inactive (03/16/2022)   Exercise Vital Sign    Days of Exercise per Week: 0 days    Minutes of Exercise per Session: 0 min  Stress: No Stress Concern Present (03/16/2022)   [employer] of Occupational Health - Occupational Stress Questionnaire    Feeling of Stress : Not at all  Social Connections: Moderately Integrated (03/16/2022)   Social Connection and Isolation Panel [NHANES]    Frequency of Communication with Friends and Family: More than three times a week    Frequency of Social Gatherings with Friends and Family: Three times a week    Attends Religious Services: More than 4 times per year    Active Member of Clubs or Organizations: No    Attends Banker Meetings: Never    Marital Status: Married  Catering manager Violence: Not At Risk (05/05/2022)   Humiliation, Afraid, Rape, and Kick questionnaire    Fear of Current or Ex-Partner: No    Emotionally Abused: No    Physically Abused: No    Sexually Abused: No    Tobacco Use: Low Risk  (07/17/2022)   Patient History    Smoking Tobacco Use: Never    Smokeless Tobacco Use: Never    Passive Exposure: Not on file   Social History   Substance and Sexual Activity  Alcohol  Use No    Family History  Problem Relation Age of Onset   Breast cancer Mother 58   Heart disease Mother    Alzheimer's disease Mother    Arrhythmia Father    Heart failure Father    Heart disease Father    Atrial fibrillation Father    Stroke Father 13   Heart failure Brother    Brain cancer Brother    Stomach cancer Neg Hx    Colon cancer Neg Hx    Pancreatic cancer Neg Hx    Esophageal cancer Neg Hx     Review of  Systems  Constitutional:  Negative for chills and fever.  HENT:  Negative for congestion, sore throat and tinnitus.   Eyes:  Negative for double vision, photophobia and pain.  Respiratory:  Negative for cough, shortness of breath and wheezing.   Cardiovascular:  Negative for chest pain, palpitations and orthopnea.  Gastrointestinal:  Negative for heartburn, nausea and vomiting.  Genitourinary:  Negative for dysuria, frequency and urgency.  Musculoskeletal:  Positive for joint pain.  Neurological:  Negative for dizziness, weakness and headaches.    Objective:  Physical Exam: Well nourished and well developed.  General: Alert and oriented x3, cooperative and pleasant, no acute distress.  Head: normocephalic, atraumatic, neck supple.  Eyes: EOMI.  Musculoskeletal:  Left knee  No warmth or effusion; range 5-115  Tender lateral greater than medial  Moderate amount of varus-valgus laxity in extension and AP laxity in flexion  Approximately 10-15 degree valgus deformity while standing  Calves soft and nontender. Motor function intact in LE. Strength 5/5 LE bilaterally. Neuro: Distal pulses 2+. Sensation to light touch intact in LE.  Imaging Review Radiographs- AP and lateral left knee taken today show a total knee arthroplasty in approximately 10-15 degrees of valgus which appears to be coming from the tibial cut. The components appear well fixed  Assessment/Plan:  Failed left total knee arthroplasty   The treatment options including medical management,  use of assistive devices and arthroplasty revision were discussed at length. The risks and benefits of total knee arthroplasty revision were presented and reviewed. The risks due to aseptic loosening, infection, stiffness, patella tracking problems, thromboembolic complications and other imponderables were discussed. The patient acknowledged the explanation, agreed to proceed with the plan and consent was signed. Patient is being admitted for inpatient treatment for surgery, pain control, PT, OT, prophylactic antibiotics, VTE prophylaxis, progressive ambulation and ADLs and discharge planning. The patient is planning to be discharged  home .  Therapy Plans: Outpatient therapy at [facility]  Disposition: Home with husband Planned DVT Prophylaxis: Xarelto 10 mg QD  DME Needed: None PCP: Lynnea Ferrier, MD (clearance received) TXA: Topical Allergies: NKDA Metal Allergy: None Anesthesia Concerns: None BMI: 25.3 Last HgbA1c: Not diabetic.  Pain Regimen: Oxycodone, tramadol Pharmacy: [facility] (have brought to room)  Other: - DVT with previous TKA in 04/2022 - Xarelto for 3 versus 6 weeks total - Had issues with pain control during hospital stay in Feb (block wore off). IV dilaudid did well.  - Patient was instructed on what medications to stop prior to surgery. - Follow-up visit in 2 weeks with Dr. Lequita Halt - Begin physical therapy following surgery - Pre-operative lab work as pre-surgical testing - Prescriptions will be provided in hospital at time of discharge  Arther Abbott, PA-C Orthopedic Surgery [facility] [place]

## 2022-10-26 ENCOUNTER — Other Ambulatory Visit: Payer: Medicare PPO

## 2022-10-26 ENCOUNTER — Encounter (HOSPITAL_COMMUNITY): Payer: Self-pay

## 2022-10-26 ENCOUNTER — Other Ambulatory Visit: Payer: Self-pay

## 2022-10-26 ENCOUNTER — Encounter (HOSPITAL_COMMUNITY)
Admission: RE | Admit: 2022-10-26 | Discharge: 2022-10-26 | Disposition: A | Discharge status: Home / Self Care | Payer: Medicare PPO | Source: Ambulatory Visit | Attending: Orthopedic Surgery | Admitting: Orthopedic Surgery

## 2022-10-26 VITALS — BP 149/87 | HR 69 | Temp 98.0°F | Resp 20 | Ht 63.5 in | Wt 142.6 lb

## 2022-10-26 DIAGNOSIS — R9431 Abnormal electrocardiogram [ECG] [EKG]: Secondary | ICD-10-CM | POA: Insufficient documentation

## 2022-10-26 DIAGNOSIS — I10 Essential (primary) hypertension: Secondary | ICD-10-CM | POA: Diagnosis not present

## 2022-10-26 DIAGNOSIS — Z01818 Encounter for other preprocedural examination: Secondary | ICD-10-CM | POA: Diagnosis not present

## 2022-10-26 HISTORY — DX: Anemia, unspecified: D64.9

## 2022-10-26 HISTORY — DX: Essential (primary) hypertension: I10

## 2022-10-26 LAB — BASIC METABOLIC PANEL
Anion gap: 9 (ref 5–15)
BUN: 17 mg/dL (ref 8–23)
CO2: 22 mmol/L (ref 22–32)
Calcium: 10 mg/dL (ref 8.9–10.3)
Chloride: 107 mmol/L (ref 98–111)
Creatinine, Ser: 0.95 mg/dL (ref 0.44–1.00)
GFR, Estimated: >60 mL/min (ref >60)
Glucose, Bld: 100 mg/dL — ABNORMAL HIGH (ref 70–99)
Potassium: 4.2 mmol/L (ref 3.5–5.1)
Sodium: 138 mmol/L (ref 135–145)

## 2022-10-26 LAB — CBC
HCT: 40.8 % (ref 36.0–46.0)
Hemoglobin: 12.7 g/dL (ref 12.0–15.0)
MCH: 30.8 pg (ref 26.0–34.0)
MCHC: 31.1 g/dL (ref 30.0–36.0)
MCV: 99 fL (ref 80.0–100.0)
Platelets: 303 10*3/uL (ref 150–400)
RBC: 4.12 MIL/uL (ref 3.87–5.11)
RDW: 12.9 % (ref 11.5–15.5)
WBC: 8.7 10*3/uL (ref 4.0–10.5)
nRBC: 0 % (ref 0.0–0.2)

## 2022-10-26 LAB — SURGICAL PCR SCREEN
MRSA, PCR: NEGATIVE
Staphylococcus aureus: POSITIVE — AB

## 2022-10-26 LAB — TYPE AND SCREEN
ABO/RH(D): B POS
Antibody Screen: NEGATIVE

## 2022-10-26 NOTE — Progress Notes (Signed)
PCR results sent to Dr. Aluisio to review. 

## 2022-10-31 ENCOUNTER — Encounter (HOSPITAL_COMMUNITY): Payer: Self-pay

## 2022-10-31 ENCOUNTER — Ambulatory Visit
Admission: RE | Admit: 2022-10-31 | Discharge: 2022-10-31 | Disposition: A | Discharge status: Home / Self Care | Payer: Medicare PPO | Source: Ambulatory Visit | Attending: Family Medicine | Admitting: Family Medicine

## 2022-10-31 DIAGNOSIS — N958 Other specified menopausal and perimenopausal disorders: Secondary | ICD-10-CM | POA: Diagnosis not present

## 2022-10-31 DIAGNOSIS — E349 Endocrine disorder, unspecified: Secondary | ICD-10-CM | POA: Diagnosis not present

## 2022-10-31 DIAGNOSIS — M8588 Other specified disorders of bone density and structure, other site: Secondary | ICD-10-CM | POA: Diagnosis not present

## 2022-10-31 DIAGNOSIS — Z90722 Acquired absence of ovaries, bilateral: Secondary | ICD-10-CM | POA: Diagnosis not present

## 2022-10-31 DIAGNOSIS — M810 Age-related osteoporosis without current pathological fracture: Secondary | ICD-10-CM

## 2022-10-31 NOTE — Anesthesia Preprocedure Evaluation (Addendum)
Anesthesia Evaluation  Patient identified by MRN, date of birth, ID band Patient awake    Reviewed: Allergy & Precautions, H&P , NPO status , Patient's Chart, lab work & pertinent test results  Airway Mallampati: II  TM Distance: >3 FB Neck ROM: Full    Dental no notable dental hx.    Pulmonary neg pulmonary ROS   Pulmonary exam normal breath sounds clear to auscultation       Cardiovascular hypertension, Normal cardiovascular exam Rhythm:Regular Rate:Normal     Neuro/Psych negative neurological ROS  negative psych ROS   GI/Hepatic negative GI ROS, Neg liver ROS,,,  Endo/Other  negative endocrine ROS    Renal/GU negative Renal ROS  negative genitourinary   Musculoskeletal negative musculoskeletal ROS (+) Arthritis ,    Abdominal   Peds negative pediatric ROS (+)  Hematology  (+) anemia   Anesthesia Other Findings   Reproductive/Obstetrics negative OB ROS                              Anesthesia Physical Anesthesia Plan  ASA: 2  Anesthesia Plan: Spinal   Post-op Pain Management:    Induction:   PONV Risk Score and Plan: Treatment may vary due to age or medical condition  Airway Management Planned: Natural Airway  Additional Equipment:   Intra-op Plan:   Post-operative Plan:   Informed Consent: I have reviewed the patients History and Physical, chart, labs and discussed the procedure including the risks, benefits and alternatives for the proposed anesthesia with the patient or authorized representative who has indicated his/her understanding and acceptance.     Dental advisory given  Plan Discussed with: CRNA  Anesthesia Plan Comments: Michelle Foster is a 72 year old female who presents to PAT prior to surgery above.  Past medical history significant for prediabetes, hypertension, hyperlipidemia.   No prior anesthesia complications   Patient was evaluated by  cardiology in 2019 after she presented to the ED for atypical chest pain with shortness of breath that improved with nitroglycerin.  She was discharged and followed up with cardiology as an outpatient who obtained a stress test and echocardiogram.  Stress test was low risk and echo showed EF of 65 to 70%, mild diastolic dysfunction, trace MR and TR.  She has not needed ongoing cardiology follow-up however did have another evaluation on 10/21/2021 for labile blood pressure.  She refused to take blood pressure medicines therefore lifestyle modifications were recommended.  )         Anesthesia Quick Evaluation

## 2022-10-31 NOTE — Progress Notes (Signed)
Case: [id number] Date/Time: 11/08/22 1115   Procedure: TOTAL KNEE REVISION (Left: Knee)   Anesthesia type: Choice   Pre-op diagnosis: Failed left total knee arthroplasty   Location: [facility] ROOM 10 / [facility]   Surgeons: Ollen Gross, MD       DISCUSSION: Michelle Foster is a 72 year old female who presents to PAT prior to surgery above.  Past medical history significant for prediabetes, hypertension, hyperlipidemia.  No prior anesthesia complications  Patient was evaluated by cardiology in 2019 after she presented to the ED for atypical chest pain with shortness of breath that improved with nitroglycerin.  She was discharged and followed up with cardiology as an outpatient who obtained a stress test and echocardiogram.  Stress test was low risk and echo showed EF of 65 to 70%, mild diastolic dysfunction, trace MR and TR.  She has not needed ongoing cardiology follow-up however did have another evaluation on 10/21/2021 for labile blood pressure.  She refused to take blood pressure medicines therefore lifestyle modifications were recommended.  Patient follows with her PCP for other chronic medical issues.  Last office visit was on 10/24/2022 and all medical issues appear to be stable.  Medical clearance given (paper copies in chart)  VS: BP (!) 149/87   Pulse 69   Temp 36.7 C (Oral)   Resp 20   Ht 5' 3.5" (1.613 m)   Wt 64.7 kg   SpO2 100%   BMI 24.86 kg/m   PROVIDERS: Donita Brooks, MD   LABS: Labs reviewed: Acceptable for surgery. (all labs ordered are listed, but only abnormal results are displayed)  Labs Reviewed  SURGICAL PCR SCREEN - Abnormal; Notable for the following components:      Result Value   Staphylococcus aureus POSITIVE (*)    All other components within normal limits  BASIC METABOLIC PANEL - Abnormal; Notable for the following components:   Glucose, Bld 100 (*)    All other components within normal limits  CBC  TYPE AND SCREEN     IMAGES:   EKG  10/26/22:  Normal sinus rhythm, rate 65 Nonspecific T wave abnormality  CV:  NM Stress test 04/03/2017:  Nuclear stress EF: 72%. The left ventricular ejection fraction is hyperdynamic (>65%). Blood pressure demonstrated a hypertensive response to exercise. There was no ST segment deviation noted during stress. This is a low risk study.   Normal exercise nuclear stress test with no evidence for prior infarct or ischemia.  Normal exercise capacity. Hypertensive blood pressure response to exertion.  Echo 04/02/2017:  Study Conclusions   - Left ventricle: The cavity size was normal. Wall thickness was    normal. Systolic function was vigorous. The estimated ejection    fraction was in the range of 65% to 70%. Wall motion was normal;    there were no regional wall motion abnormalities. Doppler    parameters are consistent with abnormal left ventricular    relaxation (grade 1 diastolic dysfunction).   Impressions:   - Normal LV systolic function; mild diastolic dysfunction; trace MR    and TR.   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,    Colon polyps    COVID-19 10/19/2022   Estrogen deficiency    Eustachian tube dysfunction  History of kidney stones    Hyperlipidemia    no meds   Hypertension    Labile HTN   Osteoporosis    Pre-diabetes     Past Surgical History:  Procedure Laterality Date   ABDOMINAL HYSTERECTOMY     TAH BSO by accident at 59    bilateral cataract surgery      BREAST BIOPSY Right 01/2021   COLONOSCOPY  2012   done in [place], physician has since retired   COLONOSCOPY WITH PROPOFOL N/A 11/13/2018   Procedure: COLONOSCOPY WITH PROPOFOL;  Surgeon: Meridee Score Netty Starring., MD;  Location: [facility];  Service: Gastroenterology;  Laterality: N/A;  NEEDS TO BE A 90  MIN SPOT   left knee meniscus tear      meniscal tear     right   OOPHORECTOMY     OVARY SURGERY     1982   POLYPECTOMY  11/13/2018   Procedure: POLYPECTOMY;  Surgeon: Meridee Score Netty Starring., MD;  Location: [facility];  Service: Gastroenterology;;   TOTAL KNEE ARTHROPLASTY Left 05/05/2022   Procedure: LEFT TOTAL KNEE ARTHROPLASTY;  Surgeon: Kathryne Hitch, MD;  Location: [facility];  Service: Orthopedics;  Laterality: Left;   ureteral tumor removal      MEDICATIONS:  acetaminophen (TYLENOL) 500 MG tablet   cetirizine (ZYRTEC) 10 MG tablet   ketoconazole (NIZORAL) 2 % shampoo   rosuvastatin (CRESTOR) 20 MG tablet    denosumab (PROLIA) injection 60 mg   Marcille Blanco [facility] Short Stay/Anesthesiology [facility] Phone [phone] 10/31/2022 10:59 AM

## 2022-11-08 ENCOUNTER — Inpatient Hospital Stay (HOSPITAL_COMMUNITY): Payer: Medicare PPO | Admitting: Medical

## 2022-11-08 ENCOUNTER — Other Ambulatory Visit: Payer: Self-pay

## 2022-11-08 ENCOUNTER — Encounter (HOSPITAL_COMMUNITY)
Admission: RE | Disposition: A | Discharge status: Home / Self Care | Payer: Self-pay | Source: Home / Self Care | Attending: Orthopedic Surgery

## 2022-11-08 ENCOUNTER — Inpatient Hospital Stay (HOSPITAL_COMMUNITY)
Admission: RE | Admit: 2022-11-08 | Discharge: 2022-11-10 | DRG: 468 | Disposition: A | Discharge status: Home / Self Care | Payer: Medicare PPO | Attending: Orthopedic Surgery | Admitting: Orthopedic Surgery

## 2022-11-08 ENCOUNTER — Encounter (HOSPITAL_COMMUNITY): Payer: Self-pay | Admitting: Orthopedic Surgery

## 2022-11-08 ENCOUNTER — Inpatient Hospital Stay (HOSPITAL_COMMUNITY): Payer: Medicare PPO | Admitting: Anesthesiology

## 2022-11-08 DIAGNOSIS — Z8616 Personal history of COVID-19: Secondary | ICD-10-CM

## 2022-11-08 DIAGNOSIS — Z86010 Personal history of colonic polyps: Secondary | ICD-10-CM | POA: Diagnosis not present

## 2022-11-08 DIAGNOSIS — M1990 Unspecified osteoarthritis, unspecified site: Secondary | ICD-10-CM | POA: Diagnosis not present

## 2022-11-08 DIAGNOSIS — R7303 Prediabetes: Secondary | ICD-10-CM | POA: Diagnosis present

## 2022-11-08 DIAGNOSIS — R6884 Jaw pain: Secondary | ICD-10-CM | POA: Diagnosis not present

## 2022-11-08 DIAGNOSIS — Z79899 Other long term (current) drug therapy: Secondary | ICD-10-CM | POA: Diagnosis not present

## 2022-11-08 DIAGNOSIS — E785 Hyperlipidemia, unspecified: Secondary | ICD-10-CM | POA: Diagnosis present

## 2022-11-08 DIAGNOSIS — T84093A Other mechanical complication of internal left knee prosthesis, initial encounter: Secondary | ICD-10-CM

## 2022-11-08 DIAGNOSIS — Z87442 Personal history of urinary calculi: Secondary | ICD-10-CM

## 2022-11-08 DIAGNOSIS — T84023A Instability of internal left knee prosthesis, initial encounter: Secondary | ICD-10-CM | POA: Diagnosis not present

## 2022-11-08 DIAGNOSIS — Z823 Family history of stroke: Secondary | ICD-10-CM | POA: Diagnosis not present

## 2022-11-08 DIAGNOSIS — Z96659 Presence of unspecified artificial knee joint: Secondary | ICD-10-CM

## 2022-11-08 DIAGNOSIS — Y792 Prosthetic and other implants, materials and accessory orthopedic devices associated with adverse incidents: Secondary | ICD-10-CM | POA: Diagnosis present

## 2022-11-08 DIAGNOSIS — I10 Essential (primary) hypertension: Secondary | ICD-10-CM | POA: Diagnosis not present

## 2022-11-08 DIAGNOSIS — Z820 Family history of epilepsy and other diseases of the nervous system: Secondary | ICD-10-CM

## 2022-11-08 DIAGNOSIS — M810 Age-related osteoporosis without current pathological fracture: Secondary | ICD-10-CM | POA: Diagnosis not present

## 2022-11-08 DIAGNOSIS — Z96652 Presence of left artificial knee joint: Secondary | ICD-10-CM | POA: Diagnosis not present

## 2022-11-08 DIAGNOSIS — Z885 Allergy status to narcotic agent status: Secondary | ICD-10-CM

## 2022-11-08 DIAGNOSIS — Z888 Allergy status to other drugs, medicaments and biological substances status: Secondary | ICD-10-CM | POA: Diagnosis not present

## 2022-11-08 DIAGNOSIS — Z808 Family history of malignant neoplasm of other organs or systems: Secondary | ICD-10-CM

## 2022-11-08 DIAGNOSIS — G8918 Other acute postprocedural pain: Secondary | ICD-10-CM | POA: Diagnosis not present

## 2022-11-08 DIAGNOSIS — Z8249 Family history of ischemic heart disease and other diseases of the circulatory system: Secondary | ICD-10-CM | POA: Diagnosis not present

## 2022-11-08 DIAGNOSIS — Z803 Family history of malignant neoplasm of breast: Secondary | ICD-10-CM

## 2022-11-08 HISTORY — PX: TOTAL KNEE REVISION: SHX996

## 2022-11-08 SURGERY — TOTAL KNEE REVISION
Anesthesia: Spinal | Site: Knee | Laterality: Left

## 2022-11-08 MED ORDER — ROPIVACAINE HCL 5 MG/ML IJ SOLN
INTRAMUSCULAR | Status: DC | PRN
  Administered 2022-11-08: 30 mL via EPIDURAL

## 2022-11-08 MED ORDER — POVIDONE-IODINE 10 % EX SWAB: 2.0000 | Freq: Once | CUTANEOUS | Status: DC

## 2022-11-08 MED ORDER — HYDROMORPHONE HCL 2 MG PO TABS: 2.0000 mg | ORAL_TABLET | ORAL | Status: DC | PRN

## 2022-11-08 MED ORDER — STERILE WATER FOR IRRIGATION IR SOLN
Status: DC | PRN
  Administered 2022-11-08: 2000 mL

## 2022-11-08 MED ORDER — MIDAZOLAM HCL 5 MG/5ML IJ SOLN
INTRAMUSCULAR | Status: DC | PRN
  Administered 2022-11-08 (×2): .5 mg via INTRAVENOUS
  Administered 2022-11-08: 1 mg via INTRAVENOUS

## 2022-11-08 MED ORDER — BUPIVACAINE LIPOSOME 1.3 % IJ SUSP
INTRAMUSCULAR | Status: AC
  Filled 2022-11-08: qty 20

## 2022-11-08 MED ORDER — LACTATED RINGERS IV SOLN: INTRAVENOUS | Status: DC

## 2022-11-08 MED ORDER — METOCLOPRAMIDE HCL 5 MG PO TABS: 5.0000 mg | ORAL_TABLET | Freq: Three times a day (TID) | ORAL | Status: DC | PRN

## 2022-11-08 MED ORDER — TRANEXAMIC ACID 1000 MG/10ML IV SOLN
INTRAVENOUS | Status: DC | PRN
  Administered 2022-11-08: 2000 mg via TOPICAL

## 2022-11-08 MED ORDER — DEXAMETHASONE SODIUM PHOSPHATE 10 MG/ML IJ SOLN
INTRAMUSCULAR | Status: AC
  Filled 2022-11-08: qty 1

## 2022-11-08 MED ORDER — SODIUM CHLORIDE (PF) 0.9 % IJ SOLN
INTRAMUSCULAR | Status: AC
  Filled 2022-11-08: qty 50

## 2022-11-08 MED ORDER — SODIUM CHLORIDE (PF) 0.9 % IJ SOLN
INTRAMUSCULAR | Status: DC | PRN
  Administered 2022-11-08: 60 mL

## 2022-11-08 MED ORDER — DROPERIDOL 2.5 MG/ML IJ SOLN: 0.6250 mg | Freq: Once | INTRAMUSCULAR | Status: DC | PRN

## 2022-11-08 MED ORDER — FLEET ENEMA RE ENEM: 1.0000 | ENEMA | Freq: Once | RECTAL | Status: DC | PRN

## 2022-11-08 MED ORDER — EPHEDRINE SULFATE (PRESSORS) 50 MG/ML IJ SOLN
INTRAMUSCULAR | Status: DC | PRN
  Administered 2022-11-08 (×2): 5 mg via INTRAVENOUS

## 2022-11-08 MED ORDER — ORAL CARE MOUTH RINSE: 15.0000 mL | Freq: Once | OROMUCOSAL | Status: AC

## 2022-11-08 MED ORDER — PHENYLEPHRINE HCL-NACL 20-0.9 MG/250ML-% IV SOLN
INTRAVENOUS | Status: DC | PRN
  Administered 2022-11-08: 30 ug/min via INTRAVENOUS

## 2022-11-08 MED ORDER — OXYCODONE HCL 5 MG PO TABS: 5.0000 mg | ORAL_TABLET | Freq: Once | ORAL | Status: DC | PRN

## 2022-11-08 MED ORDER — LIDOCAINE HCL (CARDIAC) PF 100 MG/5ML IV SOSY
PREFILLED_SYRINGE | INTRAVENOUS | Status: DC | PRN
  Administered 2022-11-08: 10 mg via INTRAVENOUS
  Administered 2022-11-08: 40 mg via INTRAVENOUS

## 2022-11-08 MED ORDER — EPHEDRINE 5 MG/ML INJ
INTRAVENOUS | Status: AC
  Filled 2022-11-08: qty 5

## 2022-11-08 MED ORDER — RIVAROXABAN 10 MG PO TABS
10.0000 mg | ORAL_TABLET | Freq: Every day | ORAL | Status: DC
  Administered 2022-11-09 – 2022-11-10 (×2): 10 mg via ORAL
  Filled 2022-11-08 (×2): qty 1

## 2022-11-08 MED ORDER — MIDAZOLAM HCL 2 MG/2ML IJ SOLN
INTRAMUSCULAR | Status: AC
  Filled 2022-11-08: qty 2

## 2022-11-08 MED ORDER — HYDROMORPHONE HCL 1 MG/ML IJ SOLN
INTRAMUSCULAR | Status: AC
  Administered 2022-11-08: 1 mg
  Filled 2022-11-08: qty 1

## 2022-11-08 MED ORDER — DEXAMETHASONE SODIUM PHOSPHATE 10 MG/ML IJ SOLN
8.0000 mg | Freq: Once | INTRAMUSCULAR | Status: AC
  Administered 2022-11-08: 8 mg via INTRAVENOUS

## 2022-11-08 MED ORDER — BUPIVACAINE IN DEXTROSE 0.75-8.25 % IT SOLN
INTRATHECAL | Status: DC | PRN
  Administered 2022-11-08: 1.6 mL via INTRATHECAL

## 2022-11-08 MED ORDER — FENTANYL CITRATE (PF) 100 MCG/2ML IJ SOLN
INTRAMUSCULAR | Status: AC
  Filled 2022-11-08: qty 2

## 2022-11-08 MED ORDER — HYDROMORPHONE HCL 2 MG PO TABS
1.0000 mg | ORAL_TABLET | ORAL | Status: DC | PRN
  Administered 2022-11-08: 2 mg via ORAL

## 2022-11-08 MED ORDER — BUPIVACAINE LIPOSOME 1.3 % IJ SUSP
INTRAMUSCULAR | Status: DC | PRN
  Administered 2022-11-08: 20 mL

## 2022-11-08 MED ORDER — SODIUM CHLORIDE 0.9 % IV SOLN: INTRAVENOUS | Status: DC

## 2022-11-08 MED ORDER — PROPOFOL 500 MG/50ML IV EMUL
INTRAVENOUS | Status: DC | PRN
  Administered 2022-11-08: 25 ug/kg/min via INTRAVENOUS

## 2022-11-08 MED ORDER — METHOCARBAMOL 500 MG PO TABS
500.0000 mg | ORAL_TABLET | Freq: Four times a day (QID) | ORAL | Status: DC | PRN
  Administered 2022-11-10: 500 mg via ORAL
  Filled 2022-11-08: qty 1

## 2022-11-08 MED ORDER — CEFAZOLIN SODIUM-DEXTROSE 2-4 GM/100ML-% IV SOLN
2.0000 g | Freq: Four times a day (QID) | INTRAVENOUS | Status: AC
  Administered 2022-11-08 (×2): 2 g via INTRAVENOUS
  Filled 2022-11-08 (×2): qty 100

## 2022-11-08 MED ORDER — PROPOFOL 1000 MG/100ML IV EMUL
INTRAVENOUS | Status: AC
  Filled 2022-11-08: qty 100

## 2022-11-08 MED ORDER — CHLORHEXIDINE GLUCONATE 0.12 % MT SOLN
15.0000 mL | Freq: Once | OROMUCOSAL | Status: AC
  Administered 2022-11-08: 15 mL via OROMUCOSAL

## 2022-11-08 MED ORDER — HYDROMORPHONE HCL 1 MG/ML IJ SOLN
0.5000 mg | INTRAMUSCULAR | Status: DC | PRN
  Administered 2022-11-08 (×2): 1 mg via INTRAVENOUS
  Filled 2022-11-08 (×2): qty 1

## 2022-11-08 MED ORDER — SODIUM CHLORIDE 0.9 % IR SOLN
Status: DC | PRN
  Administered 2022-11-08: 3000 mL

## 2022-11-08 MED ORDER — FENTANYL CITRATE PF 50 MCG/ML IJ SOSY: 25.0000 ug | PREFILLED_SYRINGE | INTRAMUSCULAR | Status: DC | PRN

## 2022-11-08 MED ORDER — SODIUM CHLORIDE (PF) 0.9 % IJ SOLN
INTRAMUSCULAR | Status: AC
  Filled 2022-11-08: qty 10

## 2022-11-08 MED ORDER — DIPHENHYDRAMINE HCL 12.5 MG/5ML PO ELIX
12.5000 mg | ORAL_SOLUTION | ORAL | Status: DC | PRN
  Administered 2022-11-09: 25 mg via ORAL
  Filled 2022-11-08: qty 10

## 2022-11-08 MED ORDER — GLYCOPYRROLATE 0.2 MG/ML IJ SOLN
INTRAMUSCULAR | Status: AC
  Filled 2022-11-08: qty 1

## 2022-11-08 MED ORDER — BISACODYL 10 MG RE SUPP: 10.0000 mg | Freq: Every day | RECTAL | Status: DC | PRN

## 2022-11-08 MED ORDER — ONDANSETRON HCL 4 MG PO TABS: 4.0000 mg | ORAL_TABLET | Freq: Four times a day (QID) | ORAL | Status: DC | PRN

## 2022-11-08 MED ORDER — ACETAMINOPHEN 10 MG/ML IV SOLN
1000.0000 mg | Freq: Four times a day (QID) | INTRAVENOUS | Status: DC
  Administered 2022-11-08: 1000 mg via INTRAVENOUS

## 2022-11-08 MED ORDER — DOCUSATE SODIUM 100 MG PO CAPS
100.0000 mg | ORAL_CAPSULE | Freq: Two times a day (BID) | ORAL | Status: DC
  Administered 2022-11-08 – 2022-11-10 (×4): 100 mg via ORAL
  Filled 2022-11-08 (×4): qty 1

## 2022-11-08 MED ORDER — METHOCARBAMOL 500 MG IVPB - SIMPLE MED: 500.0000 mg | Freq: Four times a day (QID) | INTRAVENOUS | Status: DC | PRN

## 2022-11-08 MED ORDER — FENTANYL CITRATE (PF) 100 MCG/2ML IJ SOLN
INTRAMUSCULAR | Status: DC | PRN
  Administered 2022-11-08 (×2): 25 ug via INTRAVENOUS
  Administered 2022-11-08: 50 ug via INTRAVENOUS

## 2022-11-08 MED ORDER — PHENOL 1.4 % MT LIQD: 1.0000 | OROMUCOSAL | Status: DC | PRN

## 2022-11-08 MED ORDER — TRANEXAMIC ACID 1000 MG/10ML IV SOLN
2000.0000 mg | Freq: Once | INTRAVENOUS | Status: DC
  Filled 2022-11-08: qty 20

## 2022-11-08 MED ORDER — BUPIVACAINE LIPOSOME 1.3 % IJ SUSP: 20.0000 mL | Freq: Once | INTRAMUSCULAR | Status: DC

## 2022-11-08 MED ORDER — GLYCOPYRROLATE 0.2 MG/ML IJ SOLN
INTRAMUSCULAR | Status: DC | PRN
  Administered 2022-11-08: .2 mg via INTRAVENOUS

## 2022-11-08 MED ORDER — 0.9 % SODIUM CHLORIDE (POUR BTL) OPTIME
TOPICAL | Status: DC | PRN
  Administered 2022-11-08: 1000 mL

## 2022-11-08 MED ORDER — DEXAMETHASONE SODIUM PHOSPHATE 10 MG/ML IJ SOLN
10.0000 mg | Freq: Once | INTRAMUSCULAR | Status: AC
  Administered 2022-11-09: 10 mg via INTRAVENOUS
  Filled 2022-11-08: qty 1

## 2022-11-08 MED ORDER — OXYCODONE HCL 5 MG/5ML PO SOLN: 5.0000 mg | Freq: Once | ORAL | Status: DC | PRN

## 2022-11-08 MED ORDER — PHENYLEPHRINE HCL-NACL 20-0.9 MG/250ML-% IV SOLN
INTRAVENOUS | Status: AC
  Filled 2022-11-08: qty 250

## 2022-11-08 MED ORDER — POLYETHYLENE GLYCOL 3350 17 G PO PACK: 17.0000 g | PACK | Freq: Every day | ORAL | Status: DC | PRN

## 2022-11-08 MED ORDER — ONDANSETRON HCL 4 MG/2ML IJ SOLN
INTRAMUSCULAR | Status: AC
  Filled 2022-11-08: qty 2

## 2022-11-08 MED ORDER — ACETAMINOPHEN 10 MG/ML IV SOLN: 1000.0000 mg | Freq: Once | INTRAVENOUS | Status: DC | PRN

## 2022-11-08 MED ORDER — ACETAMINOPHEN 500 MG PO TABS
1000.0000 mg | ORAL_TABLET | Freq: Four times a day (QID) | ORAL | Status: AC
  Administered 2022-11-08 – 2022-11-09 (×3): 1000 mg via ORAL
  Filled 2022-11-08 (×3): qty 2

## 2022-11-08 MED ORDER — METOCLOPRAMIDE HCL 5 MG/ML IJ SOLN: 5.0000 mg | Freq: Three times a day (TID) | INTRAMUSCULAR | Status: DC | PRN

## 2022-11-08 MED ORDER — ONDANSETRON HCL 4 MG/2ML IJ SOLN
INTRAMUSCULAR | Status: DC | PRN
  Administered 2022-11-08: 4 mg via INTRAVENOUS

## 2022-11-08 MED ORDER — TRAMADOL HCL 50 MG PO TABS
50.0000 mg | ORAL_TABLET | Freq: Four times a day (QID) | ORAL | Status: DC | PRN
  Administered 2022-11-09 (×2): 100 mg via ORAL
  Filled 2022-11-08 (×2): qty 2

## 2022-11-08 MED ORDER — CEFAZOLIN SODIUM-DEXTROSE 2-4 GM/100ML-% IV SOLN
2.0000 g | INTRAVENOUS | Status: AC
  Administered 2022-11-08: 2 g via INTRAVENOUS

## 2022-11-08 MED ORDER — ROSUVASTATIN CALCIUM 20 MG PO TABS
20.0000 mg | ORAL_TABLET | Freq: Every day | ORAL | Status: DC
  Administered 2022-11-09 – 2022-11-10 (×2): 20 mg via ORAL
  Filled 2022-11-08 (×2): qty 1

## 2022-11-08 MED ORDER — MENTHOL 3 MG MT LOZG: 1.0000 | LOZENGE | OROMUCOSAL | Status: DC | PRN

## 2022-11-08 MED ORDER — HYDROMORPHONE HCL 2 MG PO TABS
ORAL_TABLET | ORAL | Status: AC
  Filled 2022-11-08: qty 1

## 2022-11-08 MED ORDER — LIDOCAINE HCL (PF) 2 % IJ SOLN
INTRAMUSCULAR | Status: AC
  Filled 2022-11-08: qty 5

## 2022-11-08 MED ORDER — ONDANSETRON HCL 4 MG/2ML IJ SOLN
4.0000 mg | Freq: Four times a day (QID) | INTRAMUSCULAR | Status: DC | PRN
  Administered 2022-11-08: 4 mg via INTRAVENOUS
  Filled 2022-11-08: qty 2

## 2022-11-08 SURGICAL SUPPLY — 76 items
ADH SKN CLS APL DERMABOND .7 (GAUZE/BANDAGES/DRESSINGS) ×1
AUG FEM SZ4 4 REV DIST STRL LF (Miscellaneous) ×2 IMPLANT
AUG FEM SZ4 4 REV POST STRL LF (Miscellaneous) ×1 IMPLANT
AUG FEM SZ4 8 REV POST STRL LF (Miscellaneous) ×1 IMPLANT
AUG TIB .75 10 REV CMNT LM/RL (Joint) ×1 IMPLANT
AUG TIB .75 10 REV CMNT RM/LL (Joint) ×1 IMPLANT
AUG TIB ATTUNE LM/RL SZ3/4X10 (Joint) ×1 IMPLANT
AUG TIB ATTUNE RM/LL SZ3/4X10 (Joint) ×1 IMPLANT
AUGMENT DISTAL FEM SZ4 4 KNEE (Miscellaneous) IMPLANT
AUGMENT POST FEM SZ4 4 (Miscellaneous) IMPLANT
AUGMENT POST FEM SZ4 8 HIP (Miscellaneous) IMPLANT
AUGMENT TIB ATT LM/RL SZ3/4X10 (Joint) IMPLANT
AUGMENT TIB ATT RM/LL SZ3/4X10 (Joint) IMPLANT
BAG COUNTER SPONGE SURGICOUNT (BAG) IMPLANT
BAG SPEC THK2 15X12 ZIP CLS (MISCELLANEOUS)
BAG SPNG CNTER NS LX DISP (BAG)
BAG ZIPLOCK 12X15 (MISCELLANEOUS) IMPLANT
BLADE SAG 18X100X1.27 (BLADE) ×1 IMPLANT
BLADE SAW SGTL 11.0X1.19X90.0M (BLADE) ×1 IMPLANT
BLADE SURG SZ10 CARB STEEL (BLADE) IMPLANT
BNDG CMPR 6 X 5 YARDS HK CLSR (GAUZE/BANDAGES/DRESSINGS) ×1
BNDG CMPR MED 10X6 ELC LF (GAUZE/BANDAGES/DRESSINGS) ×1
BNDG ELASTIC 6INX 5YD STR LF (GAUZE/BANDAGES/DRESSINGS) ×1 IMPLANT
BNDG ELASTIC 6X10 VLCR STRL LF (GAUZE/BANDAGES/DRESSINGS) IMPLANT
BONE CEMENT GENTAMICIN (Cement) ×2 IMPLANT
BSPLAT TIB 4 CMNT REV ROT PLAT (Knees) ×1 IMPLANT
CEMENT BONE GENTAMICIN 40 (Cement) ×3 IMPLANT
COMP FEM ATTUNE CRS SZ4 LT (Femur) ×1 IMPLANT
COMPONENT FEM ATN CR SZ4 LT (Femur) IMPLANT
COVER SURGICAL LIGHT HANDLE (MISCELLANEOUS) ×1 IMPLANT
CUFF TOURN SGL QUICK 34 (TOURNIQUET CUFF) ×1
CUFF TRNQT CYL 34X4.125X (TOURNIQUET CUFF) ×1 IMPLANT
DERMABOND ADVANCED .7 DNX12 (GAUZE/BANDAGES/DRESSINGS) IMPLANT
DRAPE U-SHAPE 47X51 STRL (DRAPES) ×1 IMPLANT
DRSG AQUACEL AG ADV 3.5X10 (GAUZE/BANDAGES/DRESSINGS) ×1 IMPLANT
DURAPREP 26ML APPLICATOR (WOUND CARE) ×1 IMPLANT
ELECT REM PT RETURN 15FT ADLT (MISCELLANEOUS) ×1 IMPLANT
GLOVE BIO SURGEON STRL SZ 6.5 (GLOVE) IMPLANT
GLOVE BIO SURGEON STRL SZ7 (GLOVE) IMPLANT
GLOVE BIO SURGEON STRL SZ8 (GLOVE) ×2 IMPLANT
GLOVE BIOGEL PI IND STRL 7.0 (GLOVE) IMPLANT
GLOVE BIOGEL PI IND STRL 8 (GLOVE) ×1 IMPLANT
GOWN STRL REUS W/ TWL LRG LVL3 (GOWN DISPOSABLE) ×1 IMPLANT
GOWN STRL REUS W/TWL LRG LVL3 (GOWN DISPOSABLE) ×1
HANDPIECE INTERPULSE COAX TIP (DISPOSABLE) ×1
HOLDER FOLEY CATH W/STRAP (MISCELLANEOUS) IMPLANT
IMMOBILIZER KNEE 20 (SOFTGOODS) ×1
IMMOBILIZER KNEE 20 THIGH 36 (SOFTGOODS) ×1 IMPLANT
INSERT TIB CMT ATTUNE RP SZ4 (Knees) IMPLANT
INSERT TIB CRS ATTUNE SZ4 16 (Insert) IMPLANT
KIT TURNOVER KIT A (KITS) IMPLANT
MANIFOLD NEPTUNE II (INSTRUMENTS) ×1 IMPLANT
NS IRRIG 1000ML POUR BTL (IV SOLUTION) ×1 IMPLANT
PACK TOTAL KNEE CUSTOM (KITS) ×1 IMPLANT
PADDING CAST COTTON 6X4 STRL (CAST SUPPLIES) ×2 IMPLANT
PIN STEINMAN FIXATION KNEE (PIN) IMPLANT
PROTECTOR NERVE ULNAR (MISCELLANEOUS) ×1 IMPLANT
SET HNDPC FAN SPRY TIP SCT (DISPOSABLE) ×1 IMPLANT
SLEEVE TIB CMT ATTUNE 29 (Sleeve) IMPLANT
SPIKE FLUID TRANSFER (MISCELLANEOUS) IMPLANT
STEM STR ATTUNE PF 18X110 (Knees) IMPLANT
STEM STR ATTUNE PF 18X60 (Knees) IMPLANT
STRIP CLOSURE SKIN 1/2X4 (GAUZE/BANDAGES/DRESSINGS) ×2 IMPLANT
SUT MNCRL AB 4-0 PS2 18 (SUTURE) ×1 IMPLANT
SUT STRATAFIX 0 PDS 27 VIOLET (SUTURE) ×1
SUT VIC AB 2-0 CT1 27 (SUTURE) ×3
SUT VIC AB 2-0 CT1 TAPERPNT 27 (SUTURE) ×3 IMPLANT
SUTURE STRATFX 0 PDS 27 VIOLET (SUTURE) ×1 IMPLANT
SWAB COLLECTION DEVICE MRSA (MISCELLANEOUS) IMPLANT
SWAB CULTURE ESWAB REG 1ML (MISCELLANEOUS) IMPLANT
TOWER CARTRIDGE SMART MIX (DISPOSABLE) ×1 IMPLANT
TRAY FOLEY MTR SLVR 16FR STAT (SET/KITS/TRAYS/PACK) ×1 IMPLANT
TUBE KAMVAC SUCTION (TUBING) IMPLANT
TUBE SUCTION HIGH CAP CLEAR NV (SUCTIONS) ×1 IMPLANT
WATER STERILE IRR 1000ML POUR (IV SOLUTION) ×1 IMPLANT
WRAP KNEE MAXI GEL POST OP (GAUZE/BANDAGES/DRESSINGS) IMPLANT

## 2022-11-08 NOTE — Transfer of Care (Signed)
Immediate Anesthesia Transfer of Care Note  Patient: Michelle Foster  Procedure(s) Performed: TOTAL KNEE REVISION (Left: Knee)  Patient Location: PACU  Anesthesia Type:Spinal  Level of Consciousness: awake, alert , oriented, and patient cooperative  Airway & Oxygen Therapy: Patient Spontanous Breathing and Patient connected to face mask oxygen  Post-op Assessment: Report given to RN and Post -op Vital signs reviewed and stable  Post vital signs: Reviewed and stable  Last Vitals:  Vitals Value Taken Time  BP 107/60 11/08/22 1212  Temp    Pulse 76 11/08/22 1214  Resp 20 11/08/22 1214  SpO2 99 % 11/08/22 1214  Vitals shown include unfiled device data.  Last Pain:  Vitals:   11/08/22 0909  TempSrc: Oral  PainSc:          Complications: No notable events documented.

## 2022-11-08 NOTE — Discharge Instructions (Addendum)
Ollen Gross, MD Total Joint Specialist [facility] [address], [address] [place], [place] [address] [phone]  TOTAL KNEE REVISION POSTOPERATIVE DIRECTIONS  Knee Rehabilitation, Guidelines Following Surgery  Results after knee surgery are often greatly improved when you follow the exercise, range of motion and muscle strengthening exercises prescribed by your doctor. Safety measures are also important to protect the knee from further injury. If any of these exercises cause you to have increased pain or swelling in your knee joint, decrease the amount until you are comfortable again and slowly increase them. If you have problems or questions, call your caregiver or physical therapist for advice.   BLOOD CLOT PREVENTION Take a 10 mg Xarelto once a day for six weeks following surgery You may resume your vitamins/supplements once you have discontinued the Xarelto. Do not take any NSAIDs (Advil, Aleve, Ibuprofen, Meloxicam, etc.) until you have discontinued the Xarelto.   Information on my medicine - XARELTO (Rivaroxaban)  Why was Xarelto prescribed for you? Xarelto was prescribed for you to reduce the risk of blood clots forming after orthopedic surgery. The medical term for these abnormal blood clots is venous thromboembolism (VTE).  What do you need to know about xarelto ? Take your Xarelto ONCE DAILY at the same time every day. You may take it either with or without food.  If you have difficulty swallowing the tablet whole, you may crush it and mix in applesauce just prior to taking your dose.  Take Xarelto exactly as prescribed by your doctor and DO NOT stop taking Xarelto without talking to the doctor who prescribed the medication.  Stopping without other VTE prevention medication to take the place of Xarelto may increase your risk of developing a clot.  After discharge, you should have regular check-up appointments with your healthcare provider  that is prescribing your Xarelto.    What do you do if you miss a dose? If you miss a dose, take it as soon as you remember on the same day then continue your regularly scheduled once daily regimen the next day. Do not take two doses of Xarelto on the same day.   Important Safety Information A possible side effect of Xarelto is bleeding. You should call your healthcare provider right away if you experience any of the following: Bleeding from an injury or your nose that does not stop. Unusual colored urine (red or dark brown) or unusual colored stools (red or black). Unusual bruising for unknown reasons. A serious fall or if you hit your head (even if there is no bleeding).  Some medicines may interact with Xarelto and might increase your risk of bleeding while on Xarelto. To help avoid this, consult your healthcare provider or pharmacist prior to using any new prescription or non-prescription medications, including herbals, vitamins, non-steroidal anti-inflammatory drugs (NSAIDs) and supplements.  This website has more information on Xarelto: VisitDestination.com.br.    HOME CARE INSTRUCTIONS  Remove items at home which could result in a fall. This includes throw rugs or furniture in walking pathways.  ICE to the affected knee as much as tolerated. Icing helps control swelling. If the swelling is well controlled you will be more comfortable and rehab easier. Continue to use ice on the knee for pain and swelling from surgery. You may notice swelling that will progress down to the foot and ankle. This is normal after surgery. Elevate the leg when you are not up walking on it.    Continue to use the breathing machine  which will help keep your temperature down. It is common for your temperature to cycle up and down following surgery, especially at night when you are not up moving around and exerting yourself. The breathing machine keeps your lungs expanded and your temperature down. Do not place  pillow under the operative knee, focus on keeping the knee straight while resting  DIET You may resume your previous home diet once you are discharged from the hospital.  DRESSING / WOUND CARE / SHOWERING Keep your bulky bandage on for 2 days. On the third post-operative day you may remove the Ace bandage and gauze. There is a waterproof adhesive bandage on your skin which will stay in place until your first follow-up appointment. Once you remove this you will not need to place another bandage You may begin showering 3 days following surgery, but do not submerge the incision under water.  ACTIVITY For the first 5 days, the key is rest and control of pain and swelling Do your home exercises twice a day starting on post-operative day 3. On the days you go to physical therapy, just do the home exercises once that day. You should rest, ice and elevate the leg for 50 minutes out of every hour. Get up and walk/stretch for 10 minutes per hour. After 5 days you can increase your activity slowly as tolerated. Walk with your walker as instructed. Use the walker until you are comfortable transitioning to a cane. Walk with the cane in the opposite hand of the operative leg. You may discontinue the cane once you are comfortable and walking steadily. Avoid periods of inactivity such as sitting longer than an hour when not asleep. This helps prevent blood clots.  You may discontinue the knee immobilizer once you are able to perform a straight leg raise while lying down. You may resume a sexual relationship in one month or when given the OK by your doctor.  You may return to work once you are cleared by your doctor.  Do not drive a car for 6 weeks or until released by your surgeon.  Do not drive while taking narcotics.  TED HOSE STOCKINGS Wear the elastic stockings on both legs for three weeks following surgery during the day. You may remove them at night for sleeping.  WEIGHT BEARING Weight bearing as  tolerated with assist device (walker, cane, etc) as directed, use it as long as suggested by your surgeon or therapist, typically at least 4-6 weeks.  POSTOPERATIVE CONSTIPATION PROTOCOL Constipation - defined medically as fewer than three stools per week and severe constipation as less than one stool per week.  One of the most common issues patients have following surgery is constipation.  Even if you have a regular bowel pattern at home, your normal regimen is likely to be disrupted due to multiple reasons following surgery.  Combination of anesthesia, postoperative narcotics, change in appetite and fluid intake all can affect your bowels.  In order to avoid complications following surgery, here are some recommendations in order to help you during your recovery period.  Colace (docusate) - Pick up an over-the-counter form of Colace or another stool softener and take twice a day as long as you are requiring postoperative pain medications.  Take with a full glass of water daily.  If you experience loose stools or diarrhea, hold the colace until you stool forms back up. If your symptoms do not get better within 1 week or if they get worse, check with your doctor. Dulcolax (bisacodyl) -  Pick up over-the-counter and take as directed by the product packaging as needed to assist with the movement of your bowels.  Take with a full glass of water.  Use this product as needed if not relieved by Colace only.  MiraLax (polyethylene glycol) - Pick up over-the-counter to have on hand. MiraLax is a solution that will increase the amount of water in your bowels to assist with bowel movements.  Take as directed and can mix with a glass of water, juice, soda, coffee, or tea. Take if you go more than two days without a movement. Do not use MiraLax more than once per day. Call your doctor if you are still constipated or irregular after using this medication for 7 days in a row.  If you continue to have problems with  postoperative constipation, please contact the office for further assistance and recommendations.  If you experience "the worst abdominal pain ever" or develop nausea or vomiting, please contact the office immediatly for further recommendations for treatment.  ITCHING If you experience itching with your medications, try taking only a single pain pill, or even half a pain pill at a time.  You can also use Benadryl over the counter for itching or also to help with sleep.   MEDICATIONS See your medication summary on the "After Visit Summary" that the nursing staff will review with you prior to discharge.  You may have some home medications which will be placed on hold until you complete the course of blood thinner medication.  It is important for you to complete the blood thinner medication as prescribed by your surgeon.  Continue your approved medications as instructed at time of discharge.  PRECAUTIONS If you experience chest pain or shortness of breath - call 911 immediately for transfer to the hospital emergency department.  If you develop a fever greater that 101 F, purulent drainage from wound, increased redness or drainage from wound, foul odor from the wound/dressing, or calf pain - CONTACT YOUR SURGEON.                                                   FOLLOW-UP APPOINTMENTS Make sure you keep all of your appointments after your operation with your surgeon and caregivers. You should call the office at the above phone number and make an appointment for approximately two weeks after the date of your surgery or on the date instructed by your surgeon outlined in the "After Visit Summary".  RANGE OF MOTION AND STRENGTHENING EXERCISES  Rehabilitation of the knee is important following a knee injury or an operation. After just a few days of immobilization, the muscles of the thigh which control the knee become weakened and shrink (atrophy). Knee exercises are designed to build up the tone and strength  of the thigh muscles and to improve knee motion. Often times heat used for twenty to thirty minutes before working out will loosen up your tissues and help with improving the range of motion but do not use heat for the first two weeks following surgery. These exercises can be done on a training (exercise) mat, on the floor, on a table or on a bed. Use what ever works the best and is most comfortable for you Knee exercises include:  Leg Lifts - While your knee is still immobilized in a splint or cast, you can  do straight leg raises. Lift the leg to 60 degrees, hold for 3 sec, and slowly lower the leg. Repeat 10-20 times 2-3 times daily. Perform this exercise against resistance later as your knee gets better.  Quad and Hamstring Sets - Tighten up the muscle on the front of the thigh (Quad) and hold for 5-10 sec. Repeat this 10-20 times hourly. Hamstring sets are done by pushing the foot backward against an object and holding for 5-10 sec. Repeat as with quad sets.  Leg Slides: Lying on your back, slowly slide your foot toward your buttocks, bending your knee up off the floor (only go as far as is comfortable). Then slowly slide your foot back down until your leg is flat on the floor again. Angel Wings: Lying on your back spread your legs to the side as far apart as you can without causing discomfort.  A rehabilitation program following serious knee injuries can speed recovery and prevent re-injury in the future due to weakened muscles. Contact your doctor or a physical therapist for more information on knee rehabilitation.   POST-OPERATIVE OPIOID TAPER INSTRUCTIONS: It is important to wean off of your opioid medication as soon as possible. If you do not need pain medication after your surgery it is ok to stop day one. Opioids include: Codeine, Hydrocodone(Norco, Vicodin), Oxycodone(Percocet, oxycontin) and hydromorphone amongst others.  Long term and even short term use of opiods can cause: Increased pain  response Dependence Constipation Depression Respiratory depression And more.  Withdrawal symptoms can include Flu like symptoms Nausea, vomiting And more Techniques to manage these symptoms Hydrate well Eat regular healthy meals Stay active Use relaxation techniques(deep breathing, meditating, yoga) Do Not substitute Alcohol to help with tapering If you have been on opioids for less than two weeks and do not have pain than it is ok to stop all together.  Plan to wean off of opioids This plan should start within one week post op of your joint replacement. Maintain the same interval or time between taking each dose and first decrease the dose.  Cut the total daily intake of opioids by one tablet each day Next start to increase the time between doses. The last dose that should be eliminated is the evening dose.   IF YOU ARE TRANSFERRED TO A SKILLED REHAB FACILITY If the patient is transferred to a skilled rehab facility following release from the hospital, a list of the current medications will be sent to the facility for the patient to continue.  When discharged from the skilled rehab facility, please have the facility set up the patient's Home Health Physical Therapy prior to being released. Also, the skilled facility will be responsible for providing the patient with their medications at time of release from the facility to include their pain medication, the muscle relaxants, and their blood thinner medication. If the patient is still at the rehab facility at time of the two week follow up appointment, the skilled rehab facility will also need to assist the patient in arranging follow up appointment in our office and any transportation needs.  MAKE SURE YOU:  Understand these instructions.  Get help right away if you are not doing well or get worse.   DENTAL ANTIBIOTICS:  In most cases prophylactic antibiotics for Dental procdeures after total joint surgery are not  necessary.  Exceptions are as follows:  1. History of prior total joint infection  2. Severely immunocompromised (Organ Transplant, cancer chemotherapy, Rheumatoid biologic meds such as Humera)  3. Poorly  controlled diabetes (A1C &gt; 8.0, blood glucose over 200)  If you have one of these conditions, contact your surgeon for an antibiotic prescription, prior to your dental procedure.    Pick up stool softner and laxative for home use following surgery while on pain medications. Do not submerge incision under water. Please use good hand washing techniques while changing dressing each day. May shower starting three days after surgery. Please use a clean towel to pat the incision dry following showers. Continue to use ice for pain and swelling after surgery. Do not use any lotions or creams on the incision until instructed by your surgeon.

## 2022-11-08 NOTE — Plan of Care (Signed)
  Problem: Pain Management: Goal: Pain level will decrease with appropriate interventions Outcome: Progressing   Problem: Nutrition: Goal: Adequate nutrition will be maintained Outcome: Progressing   

## 2022-11-08 NOTE — Progress Notes (Signed)
Orthopedic Tech Progress Note Patient Details:  Michelle Foster 1951-01-29 [id number]  Patient ID: Michelle Foster, female   DOB: 1950/05/04, 72 y.o.   MRN: [id number]  Michelle Foster 11/08/2022, 4:29 PM Cpm removed. Tolerated well.

## 2022-11-08 NOTE — Plan of Care (Signed)
  Problem: Activity: Goal: Ability to avoid complications of mobility impairment will improve Outcome: Progressing   Problem: Activity: Goal: Range of joint motion will improve Outcome: Progressing   Problem: Clinical Measurements: Goal: Postoperative complications will be avoided or minimized Outcome: Progressing   Problem: Pain Management: Goal: Pain level will decrease with appropriate interventions Outcome: Progressing   Problem: Education: Goal: Knowledge of General Education information will improve Description: Including pain rating scale, medication(s)/side effects and non-pharmacologic comfort measures Outcome: Progressing

## 2022-11-08 NOTE — Progress Notes (Signed)
Orthopedic Tech Progress Note Patient Details:  Michelle Foster October 26, 1950 [id number]  CPM Left Knee CPM Left Knee: On Left Knee Flexion (Degrees): 40 Left Knee Extension (Degrees): 10  Post Interventions Patient Tolerated: Well Instructions Provided: Adjustment of device, Care of device  [name] 11/08/2022, 12:32 PM

## 2022-11-08 NOTE — Interval H&P Note (Signed)
History and Physical Interval Note:  11/08/2022 9:57 AM  Michelle Foster  has presented today for surgery, with the diagnosis of Failed left total knee arthroplasty.  The various methods of treatment have been discussed with the patient and family. After consideration of risks, benefits and other options for treatment, the patient has consented to  Procedure(s): TOTAL KNEE REVISION (Left) as a surgical intervention.  The patient's history has been reviewed, patient examined, no change in status, stable for surgery.  I have reviewed the patient's chart and labs.  Questions were answered to the patient's satisfaction.     Homero Fellers Rainie Crenshaw

## 2022-11-08 NOTE — Anesthesia Procedure Notes (Addendum)
Anesthesia Regional Block: Adductor canal block   Pre-Anesthetic Checklist: , timeout performed,  Correct Patient, Correct Site, Correct Laterality,  Correct Procedure, Correct Position, site marked,  Risks and benefits discussed,  Surgical consent,  Pre-op evaluation,  At surgeon's request and post-op pain management  Laterality: Left  Prep: chloraprep       Needles:  Injection technique: Single-shot  Needle Type: Echogenic Stimulator Needle     Needle Length: 9cm  Needle Gauge: 21     Additional Needles:   Procedures:,,,, ultrasound used (permanent image in chart),,    Narrative:  Start time: 11/08/2022 10:00 AM End time: 11/08/2022 10:05 AM Injection made incrementally with aspirations every 5 mL.  Performed by: Personally  Anesthesiologist: Frostproof Nation, MD  Additional Notes: Discussed risks and benefits of the nerve block in detail, including but not limited vascular injury, permanent nerve damage and infection.   Patient tolerated the procedure well. Local anesthetic introduced in an incremental fashion under minimal resistance after negative aspirations. No paresthesias were elicited. After completion of the procedure, no acute issues were identified and patient continued to be monitored by RN.

## 2022-11-08 NOTE — Brief Op Note (Signed)
11/08/2022  11:51 AM  PATIENT:  Michelle Foster  72 y.o. female  PRE-OPERATIVE DIAGNOSIS:  FAILED LEFT TOTAL KNEE ARTHROPLASTY  POST-OPERATIVE DIAGNOSIS:  FAILED LEFT TOTAL KNEE ARTHROPLASTY  PROCEDURE:  Procedure(s): TOTAL KNEE REVISION (Left)  SURGEON:  Surgeons and Role:    Ollen Gross, MD - Primary  PHYSICIAN ASSISTANT:   ASSISTANTS: Arther Abbott, PA-C   ANESTHESIA:   regional and spinal  EBL:  20 mL   BLOOD ADMINISTERED:none  DRAINS: none   LOCAL MEDICATIONS USED:  OTHER Exparel  COUNTS:  YES  TOURNIQUET:   Total Tourniquet Time Documented: Thigh (Left) - 60 minutes Total: Thigh (Left) - 60 minutes   DICTATION: .Other Dictation: Dictation Number [id number]  PLAN OF CARE: Admit to inpatient   PATIENT DISPOSITION:  PACU - hemodynamically stable.

## 2022-11-08 NOTE — Op Note (Signed)
[name], Michelle Foster MEDICAL RECORD NO: [id number] ACCOUNT NO: [id number] DATE OF BIRTH: 1950-04-13 FACILITY: [facility] LOCATION: [facility] PHYSICIAN: Gus Rankin. Quaneshia Wareing, MD  Operative Report   DATE OF PROCEDURE: 11/08/2022  PREOPERATIVE DIAGNOSIS:  Failed left total knee arthroplasty.  POSTOPERATIVE DIAGNOSIS:  Failed left total knee arthroplasty.  PROCEDURE:  Left total knee arthroplasty revision.  SURGEON:  Gus Rankin. Haig Gerardo, MD  ASSISTANT:  Arther Abbott, PA-C.  ANESTHESIA:  Adductor canal block and spinal.  ESTIMATED BLOOD LOSS:  25 mL.  DRAINS:  None.  TOURNIQUET TIME:  60 minutes at 300 mmHg.  COMPLICATIONS:  None.  CONDITION:  Stable to recovery.  BRIEF CLINICAL NOTE:  The patient is a 72 year old female had a left total knee arthroplasty done approximately a year and a half ago.  She has had problems with pain and instability as well as malalignment since.  She was seen in second opinion was felt  that she had an unstable knee that was in a valgus alignment.  She has had a previous infection workup, which was negative.  It was felt that the most predictable means of getting her stability and pain improved, would be to revise the knee.  She  presents now for left total knee arthroplasty revision.  DESCRIPTION OF PROCEDURE:  After successful administration of adductor canal block and spinal a tourniquet was placed high on her left thigh and her left lower extremity prepped and draped in the usual sterile fashion.  Exam under anesthesia shows gross  varus, valgus laxity in extension. Her alignment of the leg was about 15 degrees of valgus, but it is correctable to neutral.  The AP laxity is also present.  Left lower extremity was prepped and draped in the usual sterile fashion.  Extremity was  wrapped in Esmarch, tourniquet inflated to 300 mmHg.  Midline incision was made with a 10 blade through subcutaneous tissue to the level of the extensor mechanism.  Fresh blade was  used to make a medial parapatellar arthrotomy.  Soft tissue over the  proximal medial tibia subperiosteally elevated to the joint line with a knife and into the semimembranosus bursa with a Cobb elevator.  Soft tissue laterally was elevated with attention being paid to avoid the patellar tendon on tibial tubercle.  Patella  was everted, knee flexed 90 degrees.  We then were able to sublux the tibia forward to remove the tibial polyethylene.  This was taken out of the tibial tray.  Circumferential retraction is placed around the tibia and then we used an oscillating saw to  disrupt the interface between the tibial component and bone.  Tibial component was removed without any significant bone loss.  Cement was removed from the metaphysis and then we gained access to the tibial canal.  Tibia was reamed up to 18 mm for a  press-fit 18 mm stem, 60 mm in length.  Size 4 was most appropriate size for the tibia.  Using the Attune revision preparation the proximal tibia was reamed for the size 4 and then we did the keel punch for the size 4.  I then prepared for a 29 sleeve.   We had a very thick polyethylene insert that was removed and there was a large flexion and extension gaps so we are going to use augments on the tibial side, so the trial was set up with 10 mm augments medial and lateral 29 sleeve and 18 x 60 stem  extension on size 4 MBT revision tibia.  Trial was placed  with excellent fit.  Osteotome was used to disrupt the interface between the femoral component and bone.  Component was removed with essentially no bone loss.  Access was gained to the femoral canal, which was irrigated.  Femoral reaming was performed to 18 mm for an 18 x  110 stem.  The size 4 was most appropriate size for the femur also.  The 18 mm reamer was left in place for the alignment guide for the distal femoral cutting block.  Minimal resection is made distally.  I decided to use 4 mm augments distally because of  the large  extension gap.  The size 4 cutting block is placed and attached to an 18 x 110 stem.  Rotation was marked off the epicondylar axis and confirmed by creating a rectangular flexion gap at 90 degrees.  Anterior, posterior and chamfer cuts were  made where there was barely any bone removed.  Intercondylar portion of this was then placed and the intercondylar cut made.  The laxity was worse in flexion laterally, so I put an 8 mm posterior lateral augment, 4 mm posteromedial augment as well as two  4 mm distal augments onto the trial with an 18 x 110 stem.  Trials were placed and with a 16 mm insert, full extension was achieved with excellent varus, valgus, and anterior, posterior balance throughout full range of motion.  With a smaller inserts  there was laxity.  Patella was examined and soft tissue removed for the patelloplasty.  The patella tracks normally, so it was left intact.  The components were then assembled on the back table.  Trials were removed and the cut bone surfaces were  prepared with pulsatile lavage.  Once the components were assembled then two batches of gentamicin impregnated cement are mixed.  On the tibial side, we cemented proximally and cemented the sleeve in.  The components were a size 4 Attune revision tibia  with 10 mm medial and lateral augments, a 29 cemented sleeve and an 18 x 60 stem extension.  We impacted the component and we removed the extruded cement.  On the femoral side, an 18 x 110 press-fit sleeve with a size 4 Attune revision femur with 4 mm  augments medial and lateral distally 8 mm posterior lateral augment, 4 mm posteromedial augment.  The component is cemented distally and the stem is press fit.  The extruded cement is removed.  Trial 16 insert was placed, which shows the knee goes into  full extension with excellent varus, valgus, and AP balance throughout full range of motion.  Trials were removed and the permanent 16 mm Attune revision polyethylene was placed.  20  mL of Exparel mixed with 60 mL of saline was injected into the extensor  mechanism posterior capsule and the periosteum of the femur.  The wound was copiously irrigated with saline solution and the arthrotomy closed with a running #1 Stratafix suture.  Subcutaneous was closed with interrupted 2-0 Vicryl and a subcuticular  running 4-0 Monocryl.  Incisions cleaned and dried and Dermabond applied.  She was placed into a bulky sterile dressing, awakened, and transported to recovery in stable condition.  Note that a surgical assistant was of medical necessity for this procedure in order to do it in a safe and expeditious manner.  Surgical assistant was necessary for retraction of vital ligaments and neurovascular structures and for proper positioning of  the limb for safe removal of the old implant and safe and accurate placement of the new  implant.   PUS D: 11/08/2022 12:00:32 pm T: 11/08/2022 12:52:00 pm  JOB: [id number]/ [id number]

## 2022-11-08 NOTE — Anesthesia Procedure Notes (Signed)
Spinal  Patient location during procedure: OR Start time: 11/08/2022 10:39 AM End time: 11/08/2022 10:39 AM Reason for block: surgical anesthesia Staffing Performed: anesthesiologist  Anesthesiologist: Alianza Nation, MD Performed by: [place] Nation, MD Authorized by: Elliston Nation, MD   Preanesthetic Checklist Completed: patient identified, IV checked, site marked, risks and benefits discussed, surgical consent, monitors and equipment checked, pre-op evaluation and timeout performed Spinal Block Patient position: sitting Prep: DuraPrep Patient monitoring: heart rate, cardiac monitor, continuous pulse ox and blood pressure Approach: midline Location: L4-5 Needle Needle type: Pencan  Needle gauge: 24 G Assessment Sensory level: T6 Additional Notes Functioning IV was confirmed and monitors were applied. Sterile prep and drape, including hand hygiene and sterile gloves were used. The patient was positioned and the spine was prepped. The skin was anesthetized with lidocaine.  Free flow of clear CSF was obtained prior to injecting local anesthetic into the CSF.  The spinal needle aspirated freely following injection.  The needle was carefully withdrawn.  The patient tolerated the procedure well.

## 2022-11-08 NOTE — Evaluation (Signed)
Physical Therapy Evaluation Patient Details [name] MRN: [id number] DOB: 1950-12-05 Today's Date: 11/08/2022  History of Present Illness  72 yo female presents to therapy s/p L TKA revision on 11/08/2022. PMH includes but is not limited to: B LE edema, HDL, CKD, LLE DVT and L TKA on 05/05/2022.  Clinical Impression     Michelle Foster is a 72 y.o. female POD 0 s/p L TKA revision. Patient reports mod I with mobility at baseline. Patient is now limited by functional impairments (see PT problem list below) and requires CGA  for bed mobility and min A for transfers. Patient was able to ambulate 4 feet with RW and CGA level of assist. Patient instructed in exercise to facilitate ROM and circulation to manage edema. Patient will benefit from continued skilled PT interventions to address impairments and progress towards PLOF. Acute PT will follow to progress mobility and stair training in preparation for safe discharge home with family support and OPPT.       If plan is discharge home, recommend the following: A little help with walking and/or transfers;A little help with bathing/dressing/bathroom;Assistance with cooking/housework;Assist for transportation;Help with stairs or ramp for entrance   Can travel by private vehicle        Equipment Recommendations None recommended by PT  Recommendations for Other Services       Functional Status Assessment Patient has had a recent decline in their functional status and demonstrates the ability to make significant improvements in function in a reasonable and predictable amount of time.     Precautions / Restrictions Precautions Precautions: Knee;Fall Restrictions Weight Bearing Restrictions: No      Mobility  Bed Mobility Overal bed mobility: Needs Assistance Bed Mobility: Supine to Sit     Supine to sit: HOB elevated, Used rails, Contact guard     General bed mobility comments: increased time and min cues     Transfers Overall transfer level: Needs assistance Equipment used: Rolling walker (2 wheels) Transfers: Sit to/from Stand Sit to Stand: Min assist, From elevated surface           General transfer comment: cues for proper UE placement    Ambulation/Gait Ambulation/Gait assistance: Contact guard assist Gait Distance (Feet): 4 Feet Assistive device: Rolling walker (2 wheels) Gait Pattern/deviations: Step-to pattern, Trunk flexed, Antalgic Gait velocity: decreased     General Gait Details: heavy reliance on B UE support to offload LLE due to noted LLE instability at time of eval and reports of amb sesation  Stairs            Wheelchair Mobility     Tilt Bed    Modified Rankin (Stroke Patients Only)       Balance                                             Pertinent Vitals/Pain Pain Assessment Pain Assessment: 0-10 Pain Score: 4  Pain Location: L knee leg Pain Descriptors / Indicators: Aching, Constant, Discomfort, Operative site guarding Pain Intervention(s): Limited activity within patient's tolerance, Monitored during session, Premedicated before session, Repositioned    Home Living Family/patient expects to be discharged to:: Private residence Living Arrangements: Spouse/significant other Available Help at Discharge: Available 24 hours/day Type of Home: House Home Access: Stairs to enter Entrance Stairs-Rails: None Entrance Stairs-Number of Steps: 1   Home Layout: One level Home Equipment:  Cane - single point;Grab bars - tub/shower;Rolling Walker (2 wheels)      Prior Function Prior Level of Function : Independent/Modified Independent             Mobility Comments: intermittent use of SPC for mobility tasks, mod I with ADLs, self care tasks, and IADLs.       Extremity/Trunk Assessment        Lower Extremity Assessment Lower Extremity Assessment: LLE deficits/detail LLE Deficits / Details: ankle DF/PF 4/5; AA for  SLR LLE Sensation: decreased light touch;decreased proprioception    Cervical / Trunk Assessment Cervical / Trunk Assessment:  (wfl)  Communication   Communication Communication: No apparent difficulties  Cognition Arousal: Alert Behavior During Therapy: WFL for tasks assessed/performed Overall Cognitive Status: Within Functional Limits for tasks assessed                                          General Comments      Exercises Total Joint Exercises Ankle Circles/Pumps: AROM, Both, 20 reps   Assessment/Plan    PT Assessment Patient needs continued PT services  PT Problem List Decreased strength;Decreased range of motion;Decreased activity tolerance;Decreased balance;Decreased mobility;Decreased coordination;Pain       PT Treatment Interventions DME instruction;Gait training;Stair training;Functional mobility training;Therapeutic activities;Therapeutic exercise;Balance training;Neuromuscular re-education;Patient/family education;Modalities    PT Goals (Current goals can be found in the Care Plan section)  Acute Rehab PT Goals Patient Stated Goal: go shopping all day long without pain, sit at a restraunt and stand up and take off PT Goal Formulation: With patient Time For Goal Achievement: 11/22/22 Potential to Achieve Goals: Good    Frequency 7X/week     Co-evaluation               AM-PAC PT "6 Clicks" Mobility  Outcome Measure Help needed turning from your back to your side while in a flat bed without using bedrails?: A Little Help needed moving from lying on your back to sitting on the side of a flat bed without using bedrails?: A Little Help needed moving to and from a bed to a chair (including a wheelchair)?: A Little Help needed standing up from a chair using your arms (e.g., wheelchair or bedside chair)?: A Little Help needed to walk in hospital room?: A Little Help needed climbing 3-5 steps with a railing? : A Lot 6 Click Score: 17     End of Session Equipment Utilized During Treatment: Gait belt     Nurse Communication: Mobility status PT Visit Diagnosis: Unsteadiness on feet (R26.81);Other abnormalities of gait and mobility (R26.89);Muscle weakness (generalized) (M62.81);Difficulty in walking, not elsewhere classified (R26.2);Pain Pain - Right/Left: Left Pain - part of body: Knee;Leg    Time: 1610-9604 PT Time Calculation (min) (ACUTE ONLY): 23 min   Charges:   PT Evaluation $PT Eval Low Complexity: 1 Low PT Treatments $Therapeutic Activity: 8-22 mins PT General Charges $$ ACUTE PT VISIT: 1 Visit         Johnny Bridge, PT Acute Rehab   Jacqualyn Posey 11/08/2022, 7:30 PM

## 2022-11-08 NOTE — Anesthesia Postprocedure Evaluation (Signed)
Anesthesia Post Note  Patient: Michelle Foster  Procedure(s) Performed: TOTAL KNEE REVISION (Left: Knee)     Patient location during evaluation: PACU Anesthesia Type: Spinal Level of consciousness: oriented and awake and alert Pain management: pain level controlled Vital Signs Assessment: post-procedure vital signs reviewed and stable Respiratory status: spontaneous breathing, respiratory function stable and patient connected to nasal cannula oxygen Cardiovascular status: blood pressure returned to baseline and stable Postop Assessment: no headache, no backache and no apparent nausea or vomiting Anesthetic complications: no   No notable events documented.  Last Vitals:  Vitals:   11/08/22 1300 11/08/22 1400  BP: 115/71 110/66  Pulse: 89 62  Resp: 20 14  Temp: (!) 36.4 C   SpO2: 93% 91%    Last Pain:  Vitals:   11/08/22 1600  TempSrc:   PainSc: 3                  Leslie Nation

## 2022-11-09 ENCOUNTER — Encounter (HOSPITAL_COMMUNITY): Payer: Self-pay | Admitting: Orthopedic Surgery

## 2022-11-09 ENCOUNTER — Other Ambulatory Visit (HOSPITAL_COMMUNITY): Payer: Self-pay

## 2022-11-09 LAB — BASIC METABOLIC PANEL
Anion gap: 8 (ref 5–15)
BUN: 13 mg/dL (ref 8–23)
CO2: 22 mmol/L (ref 22–32)
Calcium: 9.5 mg/dL (ref 8.9–10.3)
Chloride: 105 mmol/L (ref 98–111)
Creatinine, Ser: 0.86 mg/dL (ref 0.44–1.00)
GFR, Estimated: >60 mL/min (ref >60)
Glucose, Bld: 153 mg/dL — ABNORMAL HIGH (ref 70–99)
Potassium: 4.5 mmol/L (ref 3.5–5.1)
Sodium: 135 mmol/L (ref 135–145)

## 2022-11-09 LAB — CBC
HCT: 32.7 % — ABNORMAL LOW (ref 36.0–46.0)
Hemoglobin: 10.2 g/dL — ABNORMAL LOW (ref 12.0–15.0)
MCH: 31 pg (ref 26.0–34.0)
MCHC: 31.2 g/dL (ref 30.0–36.0)
MCV: 99.4 fL (ref 80.0–100.0)
Platelets: 262 10*3/uL (ref 150–400)
RBC: 3.29 MIL/uL — ABNORMAL LOW (ref 3.87–5.11)
RDW: 13 % (ref 11.5–15.5)
WBC: 16.3 10*3/uL — ABNORMAL HIGH (ref 4.0–10.5)
nRBC: 0 % (ref 0.0–0.2)

## 2022-11-09 MED ORDER — RIVAROXABAN 10 MG PO TABS
10.0000 mg | ORAL_TABLET | Freq: Every day | ORAL | 0 refills | Status: AC
  Filled 2022-11-09: qty 42, 42d supply, fill #0

## 2022-11-09 MED ORDER — TRAMADOL HCL 50 MG PO TABS
50.0000 mg | ORAL_TABLET | Freq: Four times a day (QID) | ORAL | 0 refills | Status: DC | PRN
  Filled 2022-11-09: qty 40, 5d supply, fill #0

## 2022-11-09 MED ORDER — OXYCODONE HCL 5 MG PO TABS
5.0000 mg | ORAL_TABLET | ORAL | Status: DC | PRN
  Administered 2022-11-09 – 2022-11-10 (×5): 10 mg via ORAL
  Filled 2022-11-09 (×5): qty 2

## 2022-11-09 MED ORDER — ONDANSETRON HCL 4 MG PO TABS
4.0000 mg | ORAL_TABLET | Freq: Four times a day (QID) | ORAL | 0 refills | Status: DC | PRN
  Filled 2022-11-09: qty 20, 5d supply, fill #0

## 2022-11-09 MED ORDER — METHOCARBAMOL 500 MG PO TABS
500.0000 mg | ORAL_TABLET | Freq: Four times a day (QID) | ORAL | 0 refills | Status: DC | PRN
  Filled 2022-11-09: qty 40, 10d supply, fill #0

## 2022-11-09 MED ORDER — OXYCODONE HCL 5 MG PO TABS
5.0000 mg | ORAL_TABLET | Freq: Four times a day (QID) | ORAL | 0 refills | Status: DC | PRN
  Filled 2022-11-09: qty 42, 6d supply, fill #0

## 2022-11-09 NOTE — Progress Notes (Signed)
Physical Therapy Treatment Patient Details [name] MRN: [id number] DOB: Sep 25, 1950 Today's Date: 11/09/2022   History of Present Illness 72 yo female presents to therapy s/p L TKA revision on 11/08/2022. PMH includes but is not limited to: B LE edema, HDL, CKD, LLE DVT and L TKA on 05/05/2022.    PT Comments  Pt continues very cooperative and progressing well with mobility but with reports of increasing pain this pm.  Pt hopeful for dc home tomorrow.    If plan is discharge home, recommend the following: A little help with walking and/or transfers;A little help with bathing/dressing/bathroom;Assistance with cooking/housework;Assist for transportation;Help with stairs or ramp for entrance   Can travel by private vehicle        Equipment Recommendations  None recommended by PT    Recommendations for Other Services       Precautions / Restrictions Precautions Precautions: Knee;Fall Restrictions Weight Bearing Restrictions: No Other Position/Activity Restrictions: WBAT     Mobility  Bed Mobility Overal bed mobility: Needs Assistance Bed Mobility: Sit to Supine, Supine to Sit     Supine to sit: HOB elevated, Used rails, Contact guard Sit to supine: Contact guard assist, HOB elevated   General bed mobility comments: Cues for safety    Transfers Overall transfer level: Needs assistance Equipment used: Rolling walker (2 wheels) Transfers: Sit to/from Stand Sit to Stand: Contact guard assist           General transfer comment: cues for proper UE placement    Ambulation/Gait Ambulation/Gait assistance: Contact guard assist Gait Distance (Feet): 75 Feet (75' twice) Assistive device: Rolling walker (2 wheels) Gait Pattern/deviations: Step-to pattern, Trunk flexed, Antalgic Gait velocity: decreased     General Gait Details: min cues for sequence, posture and position from [employer]             Wheelchair Mobility     Tilt Bed     Modified Rankin (Stroke Patients Only)       Balance Overall balance assessment: Needs assistance Sitting-balance support: Feet supported, No upper extremity supported Sitting balance-Leahy Scale: Good     Standing balance support: Single extremity supported Standing balance-Leahy Scale: Poor                              Cognition Arousal: Alert Behavior During Therapy: WFL for tasks assessed/performed Overall Cognitive Status: Within Functional Limits for tasks assessed                                          Exercises Total Joint Exercises Ankle Circles/Pumps: AROM, Both, 20 reps Quad Sets: AROM, Both, 10 reps, Supine Heel Slides: AAROM, Left, 15 reps, Supine Straight Leg Raises: AAROM, AROM, Left, 10 reps, Supine    General Comments        Pertinent Vitals/Pain Pain Assessment Pain Assessment: 0-10 Pain Score: 6  Pain Location: L knee leg Pain Descriptors / Indicators: Aching, Constant, Discomfort, Operative site guarding Pain Intervention(s): Limited activity within patient's tolerance, Monitored during session, Premedicated before session, Patient requesting pain meds-RN notified, Ice applied    Home Living                          Prior Function            PT  Goals (current goals can now be found in the care plan section) Acute Rehab PT Goals Patient Stated Goal: go shopping all day long without pain, sit at a restraunt and stand up and take off PT Goal Formulation: With patient Time For Goal Achievement: 11/22/22 Potential to Achieve Goals: Good Progress towards PT goals: Progressing toward goals    Frequency    7X/week      PT Plan      Co-evaluation              AM-PAC PT "6 Clicks" Mobility   Outcome Measure  Help needed turning from your back to your side while in a flat bed without using bedrails?: A Little Help needed moving from lying on your back to sitting on the side of a flat bed  without using bedrails?: A Little Help needed moving to and from a bed to a chair (including a wheelchair)?: A Little Help needed standing up from a chair using your arms (e.g., wheelchair or bedside chair)?: A Little Help needed to walk in hospital room?: A Little Help needed climbing 3-5 steps with a railing? : A Lot 6 Click Score: 17    End of Session Equipment Utilized During Treatment: Gait belt Activity Tolerance: Patient tolerated treatment well Patient left: with call bell/phone within reach;in bed Nurse Communication: Mobility status PT Visit Diagnosis: Difficulty in walking, not elsewhere classified (R26.2) Pain - Right/Left: Left Pain - part of body: Knee;Leg     Time: 0981-1914 PT Time Calculation (min) (ACUTE ONLY): 16 min  Charges:    $Gait Training: 8-22 mins $Therapeutic Exercise: 8-22 mins PT General Charges $$ ACUTE PT VISIT: 1 Visit                     Mauro Kaufmann PT Acute Rehabilitation Services Pager [phone] Office [phone]    Tura Roller 11/09/2022, 2:28 PM

## 2022-11-09 NOTE — Progress Notes (Signed)
   Subjective: 1 Day Post-Op Procedure(s) (LRB): TOTAL KNEE REVISION (Left) Patient reports pain as mild.   Patient seen in rounds by Dr. Lequita Halt. Patient is doing very well this AM. Switched to oxycodone yesterday due to dilaudid being too strong.  No issues overnight. Denies chest pain, SOB, or calf pain. Foley catheter removed this AM.  We will continue therapy today  Objective: Vital signs in last 24 hours: Temp:  [97.4 F (36.3 C)-98 F (36.7 C)] 98 F (36.7 C) (08/22 0507) Pulse Rate:  [53-89] 54 (08/22 0507) Resp:  [14-21] 17 (08/22 0507) BP: (106-134)/(60-77) 106/60 (08/22 0507) SpO2:  [91 %-98 %] 97 % (08/22 0507) Weight:  [64.7 kg] 64.7 kg (08/21 0851)  Intake/Output from previous day:  Intake/Output Summary (Last 24 hours) at 11/09/2022 0725 Last data filed at 11/09/2022 0600 Gross per 24 hour  Intake 2391.06 ml  Output 1970 ml  Net 421.06 ml     Intake/Output this shift: No intake/output data recorded.  Labs: Recent Labs    11/09/22 0335  HGB 10.2*   Recent Labs    11/09/22 0335  WBC 16.3*  RBC 3.29*  HCT 32.7*  PLT 262   Recent Labs    11/09/22 0335  NA 135  K 4.5  CL 105  CO2 22  BUN 13  CREATININE 0.86  GLUCOSE 153*  CALCIUM 9.5   No results for input(s): "LABPT", "INR" in the last 72 hours.  Exam: General - Patient is Alert and Oriented Extremity - Neurologically intact Neurovascular intact Sensation intact distally Dorsiflexion/Plantar flexion intact Dressing - dressing C/D/I Motor Function - intact, moving foot and toes well on exam.   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   Weight as of this encounter: 64.7 kg. Advance diet Up with therapy D/C IV fluids  DVT Prophylaxis - Xarelto Weight bearing as tolerated. Continue therapy.  Plan is to go Home after hospital stay. Possible discharge later today if progresses with therapy and meeting goals. Scheduled for OPPT at [facility]. Follow-up in the office in 2 weeks.  The PDMP database was reviewed today prior to any opioid medications being prescribed to this patient.  Arther Abbott, PA-C Orthopedic Surgery [phone] 11/09/2022, 7:25 AM

## 2022-11-09 NOTE — Progress Notes (Signed)
Orthopedic Tech Progress Note Patient Details:  Michelle Foster 1950-12-23 [id number]  CPM Left Knee CPM Left Knee: Off Left Knee Flexion (Degrees): 40 Left Knee Extension (Degrees): 10  Post Interventions Patient Tolerated: Well Instructions Provided: Adjustment of device, Care of device  [name] 11/09/2022, 4:56 PM

## 2022-11-09 NOTE — TOC Transition Note (Signed)
Transition of Care [facility]) - CM/SW Discharge Note   Patient Details  [name] MRN: [id number] Date of Birth: [birth year]  Transition of Care [facility]) CM/SW Contact:  Amada Jupiter, LCSW Phone Number: 11/09/2022, 10:49 AM   Clinical Narrative:     Met briefly with pt who confirms she has needed DME in the home.  OPPT already arranged with [facility].  No TOC needs.  Final next level of care: OP Rehab Barriers to Discharge: No Barriers Identified   Patient Goals and CMS Choice      Discharge Placement                         Discharge Plan and Services Additional resources added to the After Visit Summary for                  DME Arranged: N/A DME Agency: NA                  Social Determinants of Health (SDOH) Interventions SDOH Screenings   Food Insecurity: No Food Insecurity (11/08/2022)  Housing: Low Risk  (11/08/2022)  Transportation Needs: No Transportation Needs (11/08/2022)  Utilities: Not At Risk (11/08/2022)  Alcohol Screen: Low Risk  (03/16/2022)  Depression (PHQ2-9): Low Risk  (03/16/2022)  Financial Resource Strain: Low Risk  (03/16/2022)  Physical Activity: Inactive (03/16/2022)  Social Connections: Moderately Integrated (03/16/2022)  Stress: No Stress Concern Present (03/16/2022)  Tobacco Use: Low Risk  (11/08/2022)     Readmission Risk Interventions    11/09/2022   10:49 AM  Readmission Risk Prevention Plan  Post Dischage Appt Complete  Medication Screening Complete  Transportation Screening Complete

## 2022-11-09 NOTE — Progress Notes (Signed)
Orthopedic Tech Progress Note Patient Details:  Michelle Foster 02-21-1951 [id number]  CPM Left Knee CPM Left Knee: On Left Knee Flexion (Degrees): 40 Left Knee Extension (Degrees): 10  Post Interventions Patient Tolerated: Well Instructions Provided: Adjustment of device, Care of device  [name] 11/09/2022, 2:49 PM

## 2022-11-09 NOTE — Progress Notes (Signed)
Physical Therapy Treatment Patient Details [name] MRN: [id number] DOB: 1950/08/14 Today's Date: 11/09/2022   History of Present Illness 72 yo female presents to therapy s/p L TKA revision on 11/08/2022. PMH includes but is not limited to: B LE edema, HDL, CKD, LLE DVT and L TKA on 05/05/2022.    PT Comments  Pt very cooperative and progressing well with mobility.  Pt performed HEP program with assist and up to ambulate increased distance in halls.  PT expressing concern regarding increasing pain based on experience with original TKR    If plan is discharge home, recommend the following: A little help with walking and/or transfers;A little help with bathing/dressing/bathroom;Assistance with cooking/housework;Assist for transportation;Help with stairs or ramp for entrance   Can travel by private vehicle        Equipment Recommendations  None recommended by PT    Recommendations for Other Services       Precautions / Restrictions Precautions Precautions: Knee;Fall Restrictions Weight Bearing Restrictions: No Other Position/Activity Restrictions: WBAT     Mobility  Bed Mobility Overal bed mobility: Needs Assistance Bed Mobility: Sit to Supine       Sit to supine: Min assist   General bed mobility comments: Cues for safety    Transfers Overall transfer level: Needs assistance Equipment used: Rolling walker (2 wheels) Transfers: Sit to/from Stand Sit to Stand: Contact guard assist           General transfer comment: cues for proper UE placement    Ambulation/Gait Ambulation/Gait assistance: Contact guard assist Gait Distance (Feet): 100 Feet Assistive device: Rolling walker (2 wheels) Gait Pattern/deviations: Step-to pattern, Trunk flexed, Antalgic Gait velocity: decreased     General Gait Details: cues for sequence, posture and position from [employer]             Wheelchair Mobility     Tilt Bed    Modified Rankin (Stroke  Patients Only)       Balance Overall balance assessment: Needs assistance Sitting-balance support: Feet supported, No upper extremity supported Sitting balance-Leahy Scale: Good     Standing balance support: Single extremity supported Standing balance-Leahy Scale: Poor                              Cognition Arousal: Alert Behavior During Therapy: WFL for tasks assessed/performed Overall Cognitive Status: Within Functional Limits for tasks assessed                                          Exercises Total Joint Exercises Ankle Circles/Pumps: AROM, Both, 20 reps Quad Sets: AROM, Both, 10 reps, Supine Heel Slides: AAROM, Left, 15 reps, Supine Straight Leg Raises: AAROM, AROM, Left, 10 reps, Supine    General Comments        Pertinent Vitals/Pain Pain Assessment Pain Assessment: 0-10 Pain Score: 4  Pain Location: L knee leg Pain Descriptors / Indicators: Aching, Constant, Discomfort, Operative site guarding Pain Intervention(s): Limited activity within patient's tolerance, Monitored during session, Ice applied    Home Living                          Prior Function            PT Goals (current goals can now be found in the care plan section)  Acute Rehab PT Goals Patient Stated Goal: go shopping all day long without pain, sit at a restraunt and stand up and take off PT Goal Formulation: With patient Time For Goal Achievement: 11/22/22 Potential to Achieve Goals: Good Progress towards PT goals: Progressing toward goals    Frequency    7X/week      PT Plan      Co-evaluation              AM-PAC PT "6 Clicks" Mobility   Outcome Measure  Help needed turning from your back to your side while in a flat bed without using bedrails?: A Little Help needed moving from lying on your back to sitting on the side of a flat bed without using bedrails?: A Little Help needed moving to and from a bed to a chair (including a  wheelchair)?: A Little Help needed standing up from a chair using your arms (e.g., wheelchair or bedside chair)?: A Little Help needed to walk in hospital room?: A Little Help needed climbing 3-5 steps with a railing? : A Lot 6 Click Score: 17    End of Session Equipment Utilized During Treatment: Gait belt Activity Tolerance: Patient tolerated treatment well Patient left: with call bell/phone within reach;in bed Nurse Communication: Mobility status PT Visit Diagnosis: Difficulty in walking, not elsewhere classified (R26.2) Pain - Right/Left: Left Pain - part of body: Knee;Leg     Time: 4854-6270 PT Time Calculation (min) (ACUTE ONLY): 27 min  Charges:    $Gait Training: 8-22 mins $Therapeutic Exercise: 8-22 mins PT General Charges $$ ACUTE PT VISIT: 1 Visit                     Mauro Kaufmann PT Acute Rehabilitation Services Pager [phone] Office [phone]    [name] 11/09/2022, 12:51 PM

## 2022-11-09 NOTE — Plan of Care (Signed)
  Problem: Education: Goal: Knowledge of the prescribed therapeutic regimen will improve Outcome: Progressing   Problem: Pain Management: Goal: Pain level will decrease with appropriate interventions Outcome: Progressing   Problem: Skin Integrity: Goal: Will show signs of wound healing Outcome: Progressing   

## 2022-11-10 ENCOUNTER — Other Ambulatory Visit (HOSPITAL_COMMUNITY): Payer: Self-pay

## 2022-11-10 LAB — CBC
HCT: 30.3 % — ABNORMAL LOW (ref 36.0–46.0)
Hemoglobin: 9.5 g/dL — ABNORMAL LOW (ref 12.0–15.0)
MCH: 31.9 pg (ref 26.0–34.0)
MCHC: 31.4 g/dL (ref 30.0–36.0)
MCV: 101.7 fL — ABNORMAL HIGH (ref 80.0–100.0)
Platelets: 241 10*3/uL (ref 150–400)
RBC: 2.98 MIL/uL — ABNORMAL LOW (ref 3.87–5.11)
RDW: 13.1 % (ref 11.5–15.5)
WBC: 14.6 10*3/uL — ABNORMAL HIGH (ref 4.0–10.5)
nRBC: 0 % (ref 0.0–0.2)

## 2022-11-10 MED ORDER — ACETAMINOPHEN 500 MG PO TABS
ORAL_TABLET | ORAL | Status: AC
  Filled 2022-11-10: qty 2

## 2022-11-10 MED ORDER — ACETAMINOPHEN 500 MG PO TABS
1000.0000 mg | ORAL_TABLET | Freq: Four times a day (QID) | ORAL | Status: DC | PRN
  Administered 2022-11-10: 1000 mg via ORAL

## 2022-11-10 MED ORDER — ALPRAZOLAM 0.5 MG PO TABS: 0.5000 mg | ORAL_TABLET | Freq: Once | ORAL | Status: DC

## 2022-11-10 NOTE — Progress Notes (Signed)
Physical Therapy Treatment Patient Details [name] MRN: [id number] DOB: 10/26/1950 Today's Date: 11/10/2022   History of Present Illness 72 yo female presents to therapy s/p L TKA revision on 11/08/2022. PMH includes but is not limited to: B LE edema, HDL, CKD, LLE DVT and L TKA on 05/05/2022.    PT Comments  Pt continues motivated and progressing well with mobility including up to ambulate increased distance in hall and negotiated stairs.  Pt performed HEP with written instruction provided and reviewed.  Pt eager for dc home this date.    If plan is discharge home, recommend the following: A little help with walking and/or transfers;A little help with bathing/dressing/bathroom;Assistance with cooking/housework;Assist for transportation;Help with stairs or ramp for entrance   Can travel by private vehicle        Equipment Recommendations  None recommended by PT    Recommendations for Other Services       Precautions / Restrictions Precautions Precautions: Knee;Fall Restrictions Weight Bearing Restrictions: No LLE Weight Bearing: Weight bearing as tolerated Other Position/Activity Restrictions: WBAT     Mobility  Bed Mobility Overal bed mobility: Needs Assistance Bed Mobility: Supine to Sit     Supine to sit: Supervision     General bed mobility comments: Cues for safety    Transfers Overall transfer level: Needs assistance Equipment used: Rolling walker (2 wheels) Transfers: Sit to/from Stand Sit to Stand: Supervision           General transfer comment: cues for proper UE placement    Ambulation/Gait Ambulation/Gait assistance: Contact guard assist, Supervision Gait Distance (Feet): 140 Feet Assistive device: Rolling walker (2 wheels) Gait Pattern/deviations: Step-to pattern, Trunk flexed, Antalgic Gait velocity: decreased     General Gait Details: min cues for sequence, posture and position from RW   Stairs Stairs: Yes Stairs  assistance: Min assist Stair Management: No rails, Step to pattern, Forwards, With walker Number of Stairs: 1 General stair comments: min cues for sequence   Wheelchair Mobility     Tilt Bed    Modified Rankin (Stroke Patients Only)       Balance Overall balance assessment: Needs assistance Sitting-balance support: Feet supported, No upper extremity supported Sitting balance-Leahy Scale: Good     Standing balance support: No upper extremity supported Standing balance-Leahy Scale: Poor                              Cognition Arousal: Alert Behavior During Therapy: WFL for tasks assessed/performed Overall Cognitive Status: Within Functional Limits for tasks assessed                                          Exercises Total Joint Exercises Ankle Circles/Pumps: AROM, Both, 20 reps Quad Sets: AROM, Both, 10 reps, Supine Heel Slides: AAROM, Left, 15 reps, Supine Hip ABduction/ADduction: AAROM, Left, 15 reps, Supine Straight Leg Raises: AAROM, AROM, Left, Supine, 20 reps Long Arc Quad: AAROM, Left, 10 reps, Seated    General Comments        Pertinent Vitals/Pain Pain Assessment Pain Assessment: 0-10 Pain Score: 5  Pain Location: L knee Pain Descriptors / Indicators: Aching, Constant, Discomfort, Operative site guarding Pain Intervention(s): Limited activity within patient's tolerance, Premedicated before session, Monitored during session, Ice applied    Home Living  Prior Function            PT Goals (current goals can now be found in the care plan section) Acute Rehab PT Goals Patient Stated Goal: go shopping all day long without pain, sit at a restraunt and stand up and take off PT Goal Formulation: With patient Time For Goal Achievement: 11/22/22 Potential to Achieve Goals: Good Progress towards PT goals: Progressing toward goals    Frequency    7X/week      PT Plan       Co-evaluation              AM-PAC PT "6 Clicks" Mobility   Outcome Measure  Help needed turning from your back to your side while in a flat bed without using bedrails?: A Little Help needed moving from lying on your back to sitting on the side of a flat bed without using bedrails?: A Little Help needed moving to and from a bed to a chair (including a wheelchair)?: A Little Help needed standing up from a chair using your arms (e.g., wheelchair or bedside chair)?: A Little Help needed to walk in hospital room?: A Little Help needed climbing 3-5 steps with a railing? : A Little 6 Click Score: 18    End of Session Equipment Utilized During Treatment: Gait belt Activity Tolerance: Patient tolerated treatment well Patient left: with call bell/phone within reach;in bed Nurse Communication: Mobility status PT Visit Diagnosis: Difficulty in walking, not elsewhere classified (R26.2) Pain - Right/Left: Left Pain - part of body: Knee;Leg     Time: 1610-9604 PT Time Calculation (min) (ACUTE ONLY): 40 min  Charges:    $Gait Training: 8-22 mins $Therapeutic Exercise: 8-22 mins $Therapeutic Activity: 8-22 mins PT General Charges $$ ACUTE PT VISIT: 1 Visit                     Mauro Kaufmann PT Acute Rehabilitation Services Pager [phone] Office [phone]    Michelle Foster 11/10/2022, 12:41 PM

## 2022-11-10 NOTE — Addendum Note (Signed)
Addendum  created 11/10/22 1202 by Espanola Nation, MD   Clinical Note Signed, Intraprocedure Blocks edited, SmartForm saved

## 2022-11-10 NOTE — Plan of Care (Signed)
  Problem: Education: Goal: Knowledge of the prescribed therapeutic regimen will improve Outcome: Adequate for Discharge Goal: Individualized Educational Video(s) Outcome: Adequate for Discharge   Problem: Activity: Goal: Ability to avoid complications of mobility impairment will improve 11/10/2022 1057 by Delila Spence, LPN Outcome: Adequate for Discharge 11/10/2022 0749 by Delila Spence, LPN Outcome: Progressing Goal: Range of joint motion will improve Outcome: Adequate for Discharge   Problem: Clinical Measurements: Goal: Postoperative complications will be avoided or minimized 11/10/2022 1057 by Delila Spence, LPN Outcome: Adequate for Discharge 11/10/2022 0749 by Delila Spence, LPN Outcome: Progressing   Problem: Pain Management: Goal: Pain level will decrease with appropriate interventions 11/10/2022 1057 by Delila Spence, LPN Outcome: Adequate for Discharge 11/10/2022 0749 by Delila Spence, LPN Outcome: Progressing   Problem: Skin Integrity: Goal: Will show signs of wound healing Outcome: Adequate for Discharge   Problem: Education: Goal: Knowledge of General Education information will improve Description: Including pain rating scale, medication(s)/side effects and non-pharmacologic comfort measures Outcome: Adequate for Discharge   Problem: Health Behavior/Discharge Planning: Goal: Ability to manage health-related needs will improve Outcome: Adequate for Discharge   Problem: Clinical Measurements: Goal: Ability to maintain clinical measurements within normal limits will improve Outcome: Adequate for Discharge Goal: Will remain free from infection Outcome: Adequate for Discharge Goal: Diagnostic test results will improve Outcome: Adequate for Discharge Goal: Respiratory complications will improve Outcome: Adequate for Discharge Goal: Cardiovascular complication will be avoided Outcome: Adequate for Discharge   Problem: Activity: Goal: Risk for  activity intolerance will decrease Outcome: Adequate for Discharge   Problem: Nutrition: Goal: Adequate nutrition will be maintained Outcome: Adequate for Discharge   Problem: Coping: Goal: Level of anxiety will decrease Outcome: Adequate for Discharge   Problem: Elimination: Goal: Will not experience complications related to bowel motility Outcome: Adequate for Discharge Goal: Will not experience complications related to urinary retention Outcome: Adequate for Discharge   Problem: Pain Managment: Goal: General experience of comfort will improve Outcome: Adequate for Discharge   Problem: Safety: Goal: Ability to remain free from injury will improve Outcome: Adequate for Discharge   Problem: Skin Integrity: Goal: Risk for impaired skin integrity will decrease Outcome: Adequate for Discharge

## 2022-11-10 NOTE — Progress Notes (Signed)
EKG within normal limits and vitals were reassuring. As of 9AM, jaw pain had resolved.   Has passed physical therapy and is ready for discharge. Scheduled for OPPT, will follow-up in our office in 2 weeks.   Arther Abbott, PA-C Orthopedic Surgery [facility] [place]

## 2022-11-10 NOTE — Plan of Care (Signed)
  Problem: Activity: Goal: Ability to avoid complications of mobility impairment will improve Outcome: Progressing   Problem: Clinical Measurements: Goal: Postoperative complications will be avoided or minimized Outcome: Progressing   Problem: Pain Management: Goal: Pain level will decrease with appropriate interventions Outcome: Progressing   

## 2022-11-10 NOTE — Progress Notes (Signed)
   Subjective: 2 Days Post-Op Procedure(s) (LRB): TOTAL KNEE REVISION (Left) Patient reports pain as mild.   Patient seen in rounds by Dr. Lequita Halt. Patient states her knee feels great this AM, but is having pain in her teeth/jaw. Denies any associated chest pain. Tylenol ordered, but after speaking to patient an hour later this has not improved.  Plan is to go Home after hospital stay.  Objective: Vital signs in last 24 hours: Temp:  [97.9 F (36.6 C)-98.3 F (36.8 C)] 98.3 F (36.8 C) (08/23 0536) Pulse Rate:  [55-84] 84 (08/23 0536) Resp:  [16-18] 17 (08/23 0536) BP: (107-163)/(64-80) 163/80 (08/23 0536) SpO2:  [95 %-100 %] 98 % (08/23 0536)  Intake/Output from previous day:  Intake/Output Summary (Last 24 hours) at 11/10/2022 0744 Last data filed at 11/10/2022 0600 Gross per 24 hour  Intake 2488.46 ml  Output 1500 ml  Net 988.46 ml    Intake/Output this shift: No intake/output data recorded.  Labs: Recent Labs    11/09/22 0335 11/10/22 0329  HGB 10.2* 9.5*   Recent Labs    11/09/22 0335 11/10/22 0329  WBC 16.3* 14.6*  RBC 3.29* 2.98*  HCT 32.7* 30.3*  PLT 262 241   Recent Labs    11/09/22 0335  NA 135  K 4.5  CL 105  CO2 22  BUN 13  CREATININE 0.86  GLUCOSE 153*  CALCIUM 9.5   No results for input(s): "LABPT", "INR" in the last 72 hours.  Exam: General - Patient is Alert and Oriented Extremity - Neurologically intact Neurovascular intact Sensation intact distally Dorsiflexion/Plantar flexion intact Dressing/Incision - clean, dry, no drainage Motor Function - intact, moving foot and toes well on exam.   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   Weight as of this encounter: 64.7 kg. Up with therapy  DVT Prophylaxis - Xarelto Weight-bearing as tolerated  Given reported jaw pain that has not responded to tylenol, will order EKG to r/o cardiac association. Also requested RN get a new set of vitals due to elevated BP at 0500.  Seems less likely to be cardiac related given that she has no associated chest discomfort and has actual tenderness to touch along the face.   Arther Abbott, PA-C Orthopedic Surgery [phone] 11/10/2022, 7:44 AM

## 2022-11-13 ENCOUNTER — Telehealth: Payer: Self-pay

## 2022-11-13 NOTE — Transitions of Care (Post Inpatient/ED Visit) (Signed)
11/13/2022  [name] MRN: [id number] DOB: February 06, 1951  Today's TOC FU Call Status: Today's TOC FU Call Status:: Successful TOC FU Call Completed TOC FU Call Complete Date: 11/13/22 Patient's Name and Date of Birth confirmed.  Transition Care Management Follow-up Telephone Call Date of Discharge: 11/10/22 Discharge Facility: [facility]) Type of Discharge: Inpatient Admission Primary Inpatient Discharge Diagnosis:: Left Total Knee Revision How have you been since you were released from the hospital?: Better (Patient notes she feels fantastic) Any questions or concerns?: No  Items Reviewed: Did you receive and understand the discharge instructions provided?: Yes Medications obtained,verified, and reconciled?: Yes (Medications Reviewed) Any new allergies since your discharge?: No Dietary orders reviewed?: Yes Type of Diet Ordered:: Low sodium, heart healthy Do you have support at home?: Yes People in Home: spouse Name of Support/Comfort Primary Source: Brett Canales  Medications Reviewed Today: Medications Reviewed Today     Reviewed by Jodelle Gross, RN (Case Manager) on 11/13/22 at 1500  Med List Status: <None>   Medication Order Taking? Sig Documenting Provider Last Dose Status Informant  acetaminophen (TYLENOL) 500 MG tablet [id number]  Take 500-1,000 mg by mouth every 6 (six) hours as needed (pain.). [provider]  Active Self  cetirizine (ZYRTEC) 10 MG tablet [id number] Yes Take 10 mg by mouth daily as needed for allergies. [provider] Taking Active Self  denosumab (PROLIA) injection 60 mg [id number]    Patient taking differently: Inject 60 mg into the skin every 6 (six) months. Please verify that patient has been taking calcium and vitamin D supplement and CMET within the last 12 months. Do NOT give if calcium low; contact MD and/or pharmacist to evaluate. Provide patient medication guide if has not received previously.   Donita Brooks,  MD  Active   ketoconazole (NIZORAL) 2 % shampoo [id number] Yes APPLY TOPICALLY TWICE A WEEK  Patient taking differently: Apply 1 Application topically See admin instructions. Use 3 times monthly   [place], Velna Hatchet, MD Taking Active Self  methocarbamol (ROBAXIN) 500 MG tablet [id number] Yes Take 1 tablet (500 mg total) by mouth every 6 (six) hours as needed for muscle spasms. Edmisten, Lyn Hollingshead, PA Taking Active   ondansetron (ZOFRAN) 4 MG tablet [id number] No Take 1 tablet (4 mg total) by mouth every 6 (six) hours as needed for nausea.  Patient not taking: Reported on 11/13/2022   Derenda Fennel, [place] Not Taking Active   oxyCODONE (OXY IR/ROXICODONE) 5 MG immediate release tablet [id number] Yes Take 1-2 tablets (5-10 mg total) by mouth every 6 (six) hours as needed for severe pain. Edmisten, Lyn Hollingshead, PA Taking Active   rivaroxaban (XARELTO) 10 MG TABS tablet [id number] Yes Take 1 tablet (10 mg total) by mouth daily with breakfast. Edmisten, Kristie L, PA Taking Active   rosuvastatin (CRESTOR) 20 MG tablet [id number] Yes TAKE 1 TABLET BY MOUTH EVERY DAY Donita Brooks, MD Taking Active Self  traMADol (ULTRAM) 50 MG tablet [id number] Yes Take 1-2 tablets (50-100 mg total) by mouth every 6 (six) hours as needed for moderate pain. Edmisten, Lyn Hollingshead, PA Taking Active             Home Care and Equipment/Supplies: Were Home Health Services Ordered?: No Any new equipment or medical supplies ordered?: No  Functional Questionnaire: Do you need assistance with bathing/showering or dressing?: No Do you need assistance with meal preparation?: No Do you need assistance with eating?: No Do you have difficulty maintaining continence: No Do you  need assistance with getting out of bed/getting out of a chair/moving?: No Do you have difficulty managing or taking your medications?: No  Follow up appointments reviewed: PCP Follow-up appointment confirmed?: NA Specialist Hospital Follow-up appointment  confirmed?: Yes Date of Specialist follow-up appointment?: 11/21/22 Follow-Up Specialty Provider:: Dr. Despina Hick Do you need transportation to your follow-up appointment?: No Do you understand care options if your condition(s) worsen?: Yes-patient verbalized understanding  SDOH Interventions Today    Flowsheet Row Most Recent Value  SDOH Interventions   Food Insecurity Interventions Intervention Not Indicated  Housing Interventions Intervention Not Indicated  Transportation Interventions Intervention Not Indicated      Jodelle Gross RN, BSN, CCM [facility] RN Care Coordinator/ Transitions of Care Direct Dial: [phone]  Fax: [phone]

## 2022-11-15 DIAGNOSIS — Z96652 Presence of left artificial knee joint: Secondary | ICD-10-CM | POA: Diagnosis not present

## 2022-11-15 DIAGNOSIS — M25562 Pain in left knee: Secondary | ICD-10-CM | POA: Diagnosis not present

## 2022-11-15 DIAGNOSIS — R2689 Other abnormalities of gait and mobility: Secondary | ICD-10-CM | POA: Diagnosis not present

## 2022-11-17 DIAGNOSIS — M25562 Pain in left knee: Secondary | ICD-10-CM | POA: Diagnosis not present

## 2022-11-17 DIAGNOSIS — R2689 Other abnormalities of gait and mobility: Secondary | ICD-10-CM | POA: Diagnosis not present

## 2022-11-17 DIAGNOSIS — Z96652 Presence of left artificial knee joint: Secondary | ICD-10-CM | POA: Diagnosis not present

## 2022-11-21 ENCOUNTER — Ambulatory Visit (INDEPENDENT_AMBULATORY_CARE_PROVIDER_SITE_OTHER): Payer: Medicare PPO

## 2022-11-21 VITALS — Ht 63.5 in | Wt 142.0 lb

## 2022-11-21 DIAGNOSIS — M810 Age-related osteoporosis without current pathological fracture: Secondary | ICD-10-CM

## 2022-11-21 MED ORDER — DENOSUMAB 60 MG/ML ~~LOC~~ SOSY
60.0000 mg | PREFILLED_SYRINGE | SUBCUTANEOUS | Status: DC
Start: 2022-11-21 — End: 2024-01-14
  Administered 2022-11-21: 60 mg via SUBCUTANEOUS

## 2022-11-21 NOTE — Discharge Summary (Signed)
Patient ID: Michelle Foster MRN: [id number] DOB/AGE: 08/16/1950 72 y.o.  Admit date: 11/08/2022 Discharge date: 11/10/2022  Admission Diagnoses:  Principal Problem:   Failed total knee arthroplasty [facility]) Active Problems:   Failed total left knee replacement [facility])   Discharge Diagnoses:  Same  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,    Colon polyps    COVID-19 10/19/2022   Estrogen deficiency    Eustachian tube dysfunction    History of kidney stones    Hyperlipidemia    no meds   Hypertension    Labile HTN   Osteoporosis    Pre-diabetes     Surgeries: Procedure(s): TOTAL KNEE REVISION on 11/08/2022   Consultants:   Discharged Condition: Improved  Hospital Course: [name] is an 72 y.o. female who was admitted 11/08/2022 for operative treatment ofFailed total knee arthroplasty (HCC). Patient has severe unremitting pain that affects sleep, daily activities, and work/hobbies. After pre-op clearance the patient was taken to the operating room on 11/08/2022 and underwent  Procedure(s): TOTAL KNEE REVISION.    Patient was given perioperative antibiotics:  Anti-infectives (From admission, onward)    Start     Dose/Rate Route Frequency Ordered Stop   11/08/22 1600  ceFAZolin (ANCEF) IVPB 2g/100 mL premix        2 g 200 mL/hr over 30 Minutes Intravenous Every 6 hours 11/08/22 1313 11/08/22 2220   11/08/22 0900  ceFAZolin (ANCEF) IVPB 2g/100 mL premix        2 g 200 mL/hr over 30 Minutes Intravenous On call to O.R. 11/08/22 0848 11/08/22 1023        Patient was given sequential compression devices, early ambulation, and chemoprophylaxis to prevent DVT.  Patient benefited maximally from hospital stay and there were no complications.    Recent vital signs: No data found.    Recent laboratory studies: No results for input(s): "WBC", "HGB", "HCT", "PLT", "NA", "K", "CL", "CO2", "BUN", "CREATININE", "GLUCOSE", "INR", "CALCIUM" in the last 72 hours.  Invalid input(s): "PT", "2"   Discharge Medications:   Allergies as of 11/10/2022       Reactions   Meperidine Hcl Shortness Of Breath   Codeine Nausea And Vomiting        Medication List     TAKE these medications    acetaminophen 500 MG tablet Commonly known as: TYLENOL Take 500-1,000 mg by mouth every 6 (six) hours as needed (pain.).   cetirizine 10 MG tablet Commonly known as: ZYRTEC Take 10 mg by mouth daily as needed for allergies.   ketoconazole 2 % shampoo Commonly known as: NIZORAL APPLY TOPICALLY TWICE A WEEK What changed: See the new instructions.   methocarbamol 500 MG tablet Commonly known as: ROBAXIN Take 1 tablet (500 mg total) by mouth every 6 (six) hours as needed for muscle spasms.   ondansetron 4 MG tablet Commonly known as: ZOFRAN Take 1 tablet (4 mg total) by mouth every 6 (six) hours as needed for nausea.   oxyCODONE 5 MG immediate release tablet Commonly known as: Oxy IR/ROXICODONE Take 1-2 tablets (5-10 mg total) by mouth every 6 (six) hours as needed for severe pain.   rosuvastatin 20 MG tablet Commonly known as: CRESTOR TAKE 1 TABLET BY MOUTH  EVERY DAY   traMADol 50 MG tablet Commonly known as: ULTRAM Take 1-2 tablets (50-100 mg total) by mouth every 6 (six) hours as needed for moderate pain.   Xarelto 10 MG Tabs tablet Generic drug: rivaroxaban Take 1 tablet (10 mg total) by mouth daily with breakfast.               Discharge Care Instructions  (From admission, onward)           Start     Ordered   11/09/22 0000  Weight bearing as tolerated        11/09/22 0727   11/09/22 0000  Change dressing       Comments: You may remove the bulky bandage (ACE wrap and gauze) two days after surgery. You will have an adhesive waterproof bandage  underneath. Leave this in place until your first follow-up appointment.   11/09/22 0727            Diagnostic Studies: DG Bone Density  Result Date: 10/31/2022 EXAM: DUAL X-RAY ABSORPTIOMETRY (DXA) FOR BONE MINERAL DENSITY IMPRESSION: Referring Physician:  Donita Brooks Your patient completed a bone mineral density test using GE Lunar iDXA system (analysis version: 16). Technologist:     lmn PATIENT: Name: Michelle, Foster Patient ID: [id number] Birth Date: 02/27/1951 Height: 63.5 in. Sex: Female Measured: 10/31/2022 Weight: 146.0 lbs. Indications: Advanced Age, Bilateral Oophorectomy (65.51), Caucasian, Early Menopause (256.31), Estrogen Deficient, Hysterectomy, Osteoporosis (733.00), Postmenopausal Fractures: None Treatments: None ASSESSMENT: The BMD measured at Femur Total Left is 0.635 g/cm2 with a T-score of -3.0. This patient is considered osteoporotic according to World Health Organization [facility]) criteria. The quality of the exam is good. L3 was excluded due to degenerative changes. Site Region Measured Date Measured Age YA BMD Significant CHANGE T-score AP Spine L1-L4 (L3) 10/31/2022 71.9 -2.9 0.825 g/cm2 * AP Spine L1-L4 (L3) 09/24/2020 69.8 -2.3 0.888 g/cm2 * DualFemur Total Left 10/31/2022 71.9 -3.0 0.635 g/cm2 * DualFemur Total Left 09/24/2020 69.8 -2.0 0.756 g/cm2 * DualFemur Total Mean 10/31/2022 71.9 -2.7 0.662 g/cm2 * DualFemur Total Mean 09/24/2020 69.8 -2.1 0.740 g/cm2 * World Health Organization [facility]) criteria for post-menopausal, Caucasian Women: Normal       T-score at or above -1 SD Osteopenia   T-score between -1 and -2.5 SD Osteoporosis T-score at or below -2.5 SD RECOMMENDATION: 1. All patients should optimize calcium and vitamin D intake. 2. Consider FDA-approved medical therapies in postmenopausal women and men aged 69 years and older, based on the following: a. A hip or vertebral (clinical or morphometric) fracture. b. T-score = -2.5 at the femoral neck or spine after  appropriate evaluation to exclude secondary causes. c. Low bone mass (T-score between -1.0 and -2.5 at the femoral neck or spine) and a 10-year probability of a hip fracture = 3% or a 10-year probability of a major osteoporosis-related fracture = 20% based on the [place]-adapted WHO algorithm. d. Clinician judgment and/or patient preferences may indicate treatment for people with 10-year fracture probabilities above or below these levels. FOLLOW-UP: Patients with diagnosis of osteoporosis or at high risk for fracture should have regular bone mineral density tests. Patients eligible for Medicare are allowed routine testing every 2 years. The testing frequency can be increased to one year for patients who have rapidly progressing disease, are receiving or discontinuing medical therapy to restore bone mass, or have additional risk factors. I have reviewed this study and agree with the findings. [facility] Electronically Signed   By: Marcelino Duster  Thomasena Edis M.D.   On: 10/31/2022 08:16    Disposition: Discharge disposition: 01-Home or Self Care       Discharge Instructions     Call MD / Call 911   Complete by: As directed    If you experience chest pain or shortness of breath, CALL 911 and be transported to the hospital emergency room.  If you develope a fever above 101 F, pus (white drainage) or increased drainage or redness at the wound, or calf pain, call your surgeon's office.   Change dressing   Complete by: As directed    You may remove the bulky bandage (ACE wrap and gauze) two days after surgery. You will have an adhesive waterproof bandage underneath. Leave this in place until your first follow-up appointment.   Constipation Prevention   Complete by: As directed    Drink plenty of fluids.  Prune juice may be helpful.  You may use a stool softener, such as Colace (over the counter) 100 mg twice a day.  Use MiraLax (over the counter) for constipation as needed.   Diet - low sodium heart  healthy   Complete by: As directed    Do not put a pillow under the knee. Place it under the heel.   Complete by: As directed    Driving restrictions   Complete by: As directed    No driving for two weeks   Post-operative opioid taper instructions:   Complete by: As directed    POST-OPERATIVE OPIOID TAPER INSTRUCTIONS: It is important to wean off of your opioid medication as soon as possible. If you do not need pain medication after your surgery it is ok to stop day one. Opioids include: Codeine, Hydrocodone(Norco, Vicodin), Oxycodone(Percocet, oxycontin) and hydromorphone amongst others.  Long term and even short term use of opiods can cause: Increased pain response Dependence Constipation Depression Respiratory depression And more.  Withdrawal symptoms can include Flu like symptoms Nausea, vomiting And more Techniques to manage these symptoms Hydrate well Eat regular healthy meals Stay active Use relaxation techniques(deep breathing, meditating, yoga) Do Not substitute Alcohol to help with tapering If you have been on opioids for less than two weeks and do not have pain than it is ok to stop all together.  Plan to wean off of opioids This plan should start within one week post op of your joint replacement. Maintain the same interval or time between taking each dose and first decrease the dose.  Cut the total daily intake of opioids by one tablet each day Next start to increase the time between doses. The last dose that should be eliminated is the evening dose.      TED hose   Complete by: As directed    Use stockings (TED hose) for three weeks on both leg(s).  You may remove them at night for sleeping.   Weight bearing as tolerated   Complete by: As directed         Follow-up Information     Aluisio, Homero Fellers, MD. Schedule an appointment as soon as possible for a visit in 2 week(s).   Specialty: Orthopedic Surgery Contact information: [address] [place]  [address] [place] [address] [phone]                  Signed: Arther Abbott 11/21/2022, 11:56 AM

## 2022-11-22 DIAGNOSIS — R2689 Other abnormalities of gait and mobility: Secondary | ICD-10-CM | POA: Diagnosis not present

## 2022-11-22 DIAGNOSIS — M25562 Pain in left knee: Secondary | ICD-10-CM | POA: Diagnosis not present

## 2022-11-22 DIAGNOSIS — Z96652 Presence of left artificial knee joint: Secondary | ICD-10-CM | POA: Diagnosis not present

## 2022-11-24 DIAGNOSIS — M25562 Pain in left knee: Secondary | ICD-10-CM | POA: Diagnosis not present

## 2022-11-24 DIAGNOSIS — Z96652 Presence of left artificial knee joint: Secondary | ICD-10-CM | POA: Diagnosis not present

## 2022-11-24 DIAGNOSIS — R2689 Other abnormalities of gait and mobility: Secondary | ICD-10-CM | POA: Diagnosis not present

## 2022-11-28 DIAGNOSIS — R2689 Other abnormalities of gait and mobility: Secondary | ICD-10-CM | POA: Diagnosis not present

## 2022-11-28 DIAGNOSIS — M25562 Pain in left knee: Secondary | ICD-10-CM | POA: Diagnosis not present

## 2022-11-28 DIAGNOSIS — Z96652 Presence of left artificial knee joint: Secondary | ICD-10-CM | POA: Diagnosis not present

## 2022-11-30 DIAGNOSIS — M25562 Pain in left knee: Secondary | ICD-10-CM | POA: Diagnosis not present

## 2022-11-30 DIAGNOSIS — Z96652 Presence of left artificial knee joint: Secondary | ICD-10-CM | POA: Diagnosis not present

## 2022-11-30 DIAGNOSIS — R2689 Other abnormalities of gait and mobility: Secondary | ICD-10-CM | POA: Diagnosis not present

## 2022-12-05 DIAGNOSIS — R2689 Other abnormalities of gait and mobility: Secondary | ICD-10-CM | POA: Diagnosis not present

## 2022-12-05 DIAGNOSIS — M25562 Pain in left knee: Secondary | ICD-10-CM | POA: Diagnosis not present

## 2022-12-05 DIAGNOSIS — Z96652 Presence of left artificial knee joint: Secondary | ICD-10-CM | POA: Diagnosis not present

## 2022-12-08 DIAGNOSIS — R2689 Other abnormalities of gait and mobility: Secondary | ICD-10-CM | POA: Diagnosis not present

## 2022-12-08 DIAGNOSIS — M25562 Pain in left knee: Secondary | ICD-10-CM | POA: Diagnosis not present

## 2022-12-08 DIAGNOSIS — Z96652 Presence of left artificial knee joint: Secondary | ICD-10-CM | POA: Diagnosis not present

## 2022-12-12 DIAGNOSIS — Z96652 Presence of left artificial knee joint: Secondary | ICD-10-CM | POA: Diagnosis not present

## 2022-12-12 DIAGNOSIS — R2689 Other abnormalities of gait and mobility: Secondary | ICD-10-CM | POA: Diagnosis not present

## 2022-12-12 DIAGNOSIS — M25562 Pain in left knee: Secondary | ICD-10-CM | POA: Diagnosis not present

## 2022-12-13 DIAGNOSIS — Z5189 Encounter for other specified aftercare: Secondary | ICD-10-CM | POA: Diagnosis not present

## 2022-12-18 DIAGNOSIS — Z96652 Presence of left artificial knee joint: Secondary | ICD-10-CM | POA: Diagnosis not present

## 2022-12-18 DIAGNOSIS — R2689 Other abnormalities of gait and mobility: Secondary | ICD-10-CM | POA: Diagnosis not present

## 2022-12-18 DIAGNOSIS — M25562 Pain in left knee: Secondary | ICD-10-CM | POA: Diagnosis not present

## 2022-12-21 ENCOUNTER — Other Ambulatory Visit (HOSPITAL_COMMUNITY): Payer: Medicare PPO

## 2022-12-26 DIAGNOSIS — Z96652 Presence of left artificial knee joint: Secondary | ICD-10-CM | POA: Diagnosis not present

## 2022-12-26 DIAGNOSIS — M25562 Pain in left knee: Secondary | ICD-10-CM | POA: Diagnosis not present

## 2022-12-26 DIAGNOSIS — R2689 Other abnormalities of gait and mobility: Secondary | ICD-10-CM | POA: Diagnosis not present

## 2022-12-29 ENCOUNTER — Ambulatory Visit
Admission: RE | Admit: 2022-12-29 | Discharge: 2022-12-29 | Disposition: A | Discharge status: Home / Self Care | Payer: Medicare PPO | Source: Ambulatory Visit | Attending: Family Medicine | Admitting: Family Medicine

## 2022-12-29 DIAGNOSIS — Z1231 Encounter for screening mammogram for malignant neoplasm of breast: Secondary | ICD-10-CM | POA: Diagnosis not present

## 2023-02-14 DIAGNOSIS — G245 Blepharospasm: Secondary | ICD-10-CM | POA: Diagnosis not present

## 2023-02-14 DIAGNOSIS — H02423 Myogenic ptosis of bilateral eyelids: Secondary | ICD-10-CM | POA: Diagnosis not present

## 2023-03-30 ENCOUNTER — Ambulatory Visit: Payer: Medicare PPO

## 2023-05-17 DIAGNOSIS — H02423 Myogenic ptosis of bilateral eyelids: Secondary | ICD-10-CM | POA: Diagnosis not present

## 2023-05-17 DIAGNOSIS — G5132 Clonic hemifacial spasm, left: Secondary | ICD-10-CM | POA: Diagnosis not present

## 2023-05-17 DIAGNOSIS — G5133 Clonic hemifacial spasm, bilateral: Secondary | ICD-10-CM | POA: Diagnosis not present

## 2023-05-17 DIAGNOSIS — G245 Blepharospasm: Secondary | ICD-10-CM | POA: Diagnosis not present

## 2023-05-17 DIAGNOSIS — G5131 Clonic hemifacial spasm, right: Secondary | ICD-10-CM | POA: Diagnosis not present

## 2023-05-17 DIAGNOSIS — H0279 Other degenerative disorders of eyelid and periocular area: Secondary | ICD-10-CM | POA: Diagnosis not present

## 2023-05-17 DIAGNOSIS — G244 Idiopathic orofacial dystonia: Secondary | ICD-10-CM | POA: Diagnosis not present

## 2023-08-07 ENCOUNTER — Encounter: Payer: Self-pay | Admitting: Gastroenterology

## 2023-08-15 DIAGNOSIS — H0279 Other degenerative disorders of eyelid and periocular area: Secondary | ICD-10-CM | POA: Diagnosis not present

## 2023-08-15 DIAGNOSIS — H02423 Myogenic ptosis of bilateral eyelids: Secondary | ICD-10-CM | POA: Diagnosis not present

## 2023-08-15 DIAGNOSIS — G245 Blepharospasm: Secondary | ICD-10-CM | POA: Diagnosis not present

## 2023-08-15 DIAGNOSIS — Z961 Presence of intraocular lens: Secondary | ICD-10-CM | POA: Diagnosis not present

## 2023-08-16 ENCOUNTER — Ambulatory Visit: Payer: Self-pay | Admitting: Family Medicine

## 2023-08-16 NOTE — Telephone Encounter (Signed)
 Chief Complaint: Pain in rectum for months worsening in past two weeks  Symptoms: Hurts when sitting, laying down, or bending over, blood pressure elevated; 8/10 pain level intermittent, sometimes pain on left side of abdomen Pertinent Negatives: Patient denies redness, itching, constipation, blood in stool, nausea, vomiting, fever  Disposition: [x] Urgent Care (no appt availability in office)   Additional Notes: Patient not sure what is causing the pain. Patient going to urgent care as no appointment availability in office. This RN educated pt on new-worsening symptoms and when to call back/seek emergent care. Pt verbalized understanding and agrees to plan.    Copied from CRM [id number]. Topic: Clinical - Red Word Triage >> Aug 16, 2023  3:54 PM Donald Frost wrote: Red Word that prompted transfer to Nurse Triage: The spouse called in stating his wife has had a pain in her behind for quite some time. She got on the phones and states it has gotten worse over the last few weeks and that it feels like it is coming from up inside her rectum. I will trasnfer her to E2C[facility] NT. Reason for Disposition  MODERATE-SEVERE rectal pain (i.e., interferes with school, work, or sleep)  Answer Assessment - Initial Assessment Questions Chief Complaint: Pain in rectum for months worsening in past two weeks  Symptoms: Hurts when sitting, laying down, or bending over, blood pressure elevated; 8/10 pain level intermittent, sometimes pain on left side of abdomen  Pertinent Negatives: Patient denies redness, itching, constipation, blood in stool, nausea, vomiting, fever  Protocols used: Rectal Symptoms-A-AH

## 2023-09-20 DIAGNOSIS — H53032 Strabismic amblyopia, left eye: Secondary | ICD-10-CM | POA: Diagnosis not present

## 2023-09-20 DIAGNOSIS — H26493 Other secondary cataract, bilateral: Secondary | ICD-10-CM | POA: Diagnosis not present

## 2023-09-20 DIAGNOSIS — G43109 Migraine with aura, not intractable, without status migrainosus: Secondary | ICD-10-CM | POA: Diagnosis not present

## 2023-09-20 DIAGNOSIS — H43813 Vitreous degeneration, bilateral: Secondary | ICD-10-CM | POA: Diagnosis not present

## 2023-10-04 ENCOUNTER — Encounter: Payer: Self-pay | Admitting: Gastroenterology

## 2023-10-04 ENCOUNTER — Ambulatory Visit: Admitting: Gastroenterology

## 2023-10-04 VITALS — BP 122/78 | HR 80 | Ht 63.5 in | Wt 150.0 lb

## 2023-10-04 DIAGNOSIS — R102 Pelvic and perineal pain: Secondary | ICD-10-CM | POA: Diagnosis not present

## 2023-10-04 DIAGNOSIS — K5909 Other constipation: Secondary | ICD-10-CM | POA: Diagnosis not present

## 2023-10-04 NOTE — Progress Notes (Signed)
 [facility] Consult Note:  History: Michelle Foster 10/04/2023  Referring provider: [name] DASEN, MD  Reason for consult/chief complaint: Rectal Pain (Pt states she is having rectal pain, pt states she been having sharp rectal pain for a year, pt states when she bend over that's when the pain is the worst.)   Subjective  Prior history: Surveillance colonoscopy with Dr. [name] June 2020-markedly tortuous and redundant and could not complete exam. Subsequent colonoscopy with Dr. Wilhelmenia August 2020 very difficult exam (50 minutes to reach the cecum), few diminutive hyperplastic polyps removed    Discussed the use of AI scribe software for clinical note transcription with the patient, who gave verbal consent to proceed.  History of Present Illness   Michelle Foster is a very pleasant 73 year old woman known to me from previous endoscopic testing who is here today to discuss rectal pain.  For about a year she has had a dull constant feeling of pressure in the lower pelvis in the area of the rectum and she feels that she cannot get in a comfortable position.  She has chronic constipation for years and this is not changed (markedly redundant and tortuous colon-see above).  However, this pain has no association with bowel movements.  She reported having had at least 1 anal fissure in the past and this feels different.  The pain gets decidedly worse when she moves in certain positions, particularly if she bends straight over it becomes quite severe.  Pain was worse recently and she went to an urgent care near her home and says the provider did an exam including rectal and did not find a fissure. Michelle Foster denies black or bloody stools or any chronic upper digestive symptoms. She went to a local neurosurgeon (? [facility]) who apparently did some plain x-rays and examined her and gave her some therapy exercises to do. She is particular concerned because the problem is getting  steadily worse in recent months and she is trying to evaluate all possibilities.   ROS:  Review of Systems  Constitutional:  Negative for appetite change and unexpected weight change.  HENT:  Negative for mouth sores and voice change.   Eyes:  Negative for pain and redness.  Respiratory:  Negative for cough and shortness of breath.   Cardiovascular:  Positive for leg swelling. Negative for chest pain and palpitations.  Genitourinary:  Negative for dysuria and hematuria.  Musculoskeletal:  Positive for arthralgias. Negative for myalgias.  Skin:  Negative for pallor and rash.  Neurological:  Negative for weakness and headaches.  Hematological:  Negative for adenopathy.     Past Medical History: 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,    Colon polyps    COVID-19 10/19/2022   Estrogen deficiency    Eustachian tube dysfunction    History of kidney stones    Hyperlipidemia    no meds   Hypertension    Labile HTN   Osteoporosis    Pre-diabetes      Past Surgical History: Past Surgical History:  Procedure Laterality Date   ABDOMINAL HYSTERECTOMY     TAH BSO by accident at 40    bilateral cataract surgery      BREAST BIOPSY Right 01/2021   COLONOSCOPY  2012   done in [place] , physician has since retired   COLONOSCOPY WITH PROPOFOL  N/A 11/13/2018   Procedure: COLONOSCOPY WITH PROPOFOL ;  Surgeon: Mansouraty, Aloha Raddle., MD;  Location: [facility];  Service: Gastroenterology;  Laterality: N/A;  NEEDS TO BE A 90 MIN SPOT   left knee meniscus tear      meniscal tear     right   OOPHORECTOMY     OVARY SURGERY     1982   POLYPECTOMY  11/13/2018   Procedure: POLYPECTOMY;  Surgeon: [name], MD;  Location: [facility];  Service: Gastroenterology;;   TOTAL KNEE ARTHROPLASTY  Left 05/05/2022   Procedure: LEFT TOTAL KNEE ARTHROPLASTY;  Surgeon: [name], MD;  Location: [facility];  Service: Orthopedics;  Laterality: Left;   TOTAL KNEE REVISION Left 11/08/2022   Procedure: TOTAL KNEE REVISION;  Surgeon: [name], MD;  Location: [facility];  Service: Orthopedics;  Laterality: Left;   ureteral tumor removal       Family History: Family History  Problem Relation Age of Onset   Breast cancer Mother 20   Heart disease Mother    Alzheimer's disease Mother    Arrhythmia Father    Heart failure Father    Heart disease Father    Atrial fibrillation Father    Stroke Father 49   Heart failure Brother    Brain cancer Brother    Stomach cancer Neg Hx    Colon cancer Neg Hx    Pancreatic cancer Neg Hx    Esophageal cancer Neg Hx     Social History: Social History   Socioeconomic History   Marital status: Married    Spouse name: Not on file   Number of children: 1   Years of education: Not on file   Highest education level: Not on file  Occupational History   Occupation: retired  Tobacco Use   Smoking status: Never   Smokeless tobacco: Never  Vaping Use   Vaping status: Never Used  Substance and Sexual Activity   Alcohol use: No   Drug use: No   Sexual activity: Not on file  Other Topics Concern   Not on file  Social History Narrative   Retired   Chief Executive Officer Drivers of Corporate investment banker Strain: Low Risk  (03/16/2022)   Overall Financial Resource Strain (CARDIA)    Difficulty of Paying Living Expenses: Not hard at all  Food Insecurity: No Food Insecurity (11/13/2022)   Hunger Vital Sign    Worried About Running Out of Food in the Last Year: Never true    Ran Out of Food in the Last Year: Never true  Transportation Needs: No Transportation Needs (11/13/2022)   PRAPARE - Administrator, Civil Service (Medical): No    Lack of Transportation (Non-Medical): No  Physical Activity: Inactive (03/16/2022)   Exercise Vital  Sign    Days of Exercise per Week: 0 days    Minutes of Exercise per Session: 0 min  Stress: No Stress Concern Present (03/16/2022)   [employer] of Occupational Health - Occupational Stress Questionnaire    Feeling of Stress : Not at all  Social Connections: Moderately Integrated (03/16/2022)   Social Connection and Isolation Panel    Frequency of Communication with Friends and Family: More than three times a week    Frequency of Social Gatherings with Friends and Family: Three times a week    Attends Religious Services: More than 4 times per year  Active Member of Clubs or Organizations: No    Attends Banker Meetings: Never    Marital Status: Married    Allergies: Allergies  Allergen Reactions   Meperidine Hcl Shortness Of Breath   Codeine Nausea And Vomiting    Outpatient Meds: Current Outpatient Medications  Medication Sig Dispense Refill   acetaminophen  (TYLENOL ) 500 MG tablet Take 500-1,000 mg by mouth every 6 (six) hours as needed (pain.).     cetirizine (ZYRTEC) 10 MG tablet Take 10 mg by mouth daily as needed for allergies. (Patient not taking: Reported on 10/04/2023)     ketoconazole  (NIZORAL ) 2 % shampoo APPLY TOPICALLY TWICE A WEEK (Patient not taking: Reported on 10/04/2023) 120 mL 1   methocarbamol  (ROBAXIN ) 500 MG tablet Take 1 tablet (500 mg total) by mouth every 6 (six) hours as needed for muscle spasms. (Patient not taking: Reported on 10/04/2023) 40 tablet 0   ondansetron  (ZOFRAN ) 4 MG tablet Take 1 tablet (4 mg total) by mouth every 6 (six) hours as needed for nausea. (Patient not taking: Reported on 10/04/2023) 20 tablet 0   oxyCODONE  (OXY IR/ROXICODONE ) 5 MG immediate release tablet Take 1-2 tablets (5-10 mg total) by mouth every 6 (six) hours as needed for severe pain. (Patient not taking: Reported on 10/04/2023) 42 tablet 0   rosuvastatin  (CRESTOR ) 20 MG tablet TAKE 1 TABLET BY MOUTH EVERY DAY (Patient not taking: Reported on 10/04/2023) 90  tablet 1   traMADol  (ULTRAM ) 50 MG tablet Take 1-2 tablets (50-100 mg total) by mouth every 6 (six) hours as needed for moderate pain. (Patient not taking: Reported on 10/04/2023) 40 tablet 0   Current Facility-Administered Medications  Medication Dose Route Frequency Provider Last Rate Last Admin   denosumab  (PROLIA ) injection 60 mg  60 mg Subcutaneous Q6 months [name] DASEN, MD   60 mg at 11/21/22 1121      ___________________________________________________________________ Objective   Exam:  BP 122/78   Pulse 80   Ht 5' 3.5 (1.613 m)   Wt 150 lb (68 kg)   BMI 26.15 kg/m  Wt Readings from Last 3 Encounters:  10/04/23 150 lb (68 kg)  11/21/22 142 lb (64.4 kg)  11/08/22 142 lb 9.6 oz (64.7 kg)   Corean Amsterdam, CMA present for entire exam General: Well-appearing Eyes: sclera anicteric, no redness ENT: oral mucosa moist without lesions, no cervical or supraclavicular lymphadenopathy CV: Regular without appreciable murmur, no JVD, symmetrical bilateral lower leg edema Resp: clear to auscultation bilaterally, normal RR and effort noted GI: soft, no tenderness, with active bowel sounds. No guarding or palpable organomegaly noted. Skin; warm and dry, no rash or jaundice noted Neuro: awake, alert and oriented x 3. Normal gross motor function and fluent speech Normal perianal exam, DRE reveals normal resting and voluntary sphincter tone with no palpable internal lesion. However, she is tender with palpation of the posterior wall/sacral area  Labs:     Latest Ref Rng & Units 11/09/2022    3:35 AM 10/26/2022   10:48 AM 05/06/2022    3:21 AM  CMP  Glucose 70 - 99 mg/dL 846  899  872   BUN 8 - 23 mg/dL 13  17  12    Creatinine 0.44 - 1.00 mg/dL 9.13  9.04  9.11   Sodium 135 - 145 mmol/L 135  138  136   Potassium 3.5 - 5.1 mmol/L 4.5  4.2  4.5   Chloride 98 - 111 mmol/L 105  107  105   CO2  22 - 32 mmol/L 22  22  23    Calcium  8.9 - 10.3 mg/dL 9.5  89.9  9.4        Encounter Diagnoses  Name Primary?   Pelvic pain Yes   Chronic constipation      Assessment & Plan  Longstanding chronic constipation unrelated to this pelvic pain.  Pelvic pain that, based on history and exam, is sacroiliitis. Recommended conferring with primary care about some NSAIDs.  She had previously been told she should be cautious of NSAIDs due to some kidney issues.  Last known creatinine normal, but perhaps there is more to the history that I am aware of that might affect the decision of NSAID use. Seems to be at least a short trial of it would likely be helpful to see if it relieves this pain.    Thank you for the courtesy of this consult.  Please call me with any questions or concerns.  Victory LITTIE Brand III  CC: Referring provider noted above

## 2023-10-04 NOTE — Patient Instructions (Signed)
 _______________________________________________________  If your blood pressure at your visit was 140/90 or greater, please contact your primary care physician to follow up on this.  _______________________________________________________  If you are age 73 or older, your body mass index should be between 23-30. Your Body mass index is 26.15 kg/m. If this is out of the aforementioned range listed, please consider follow up with your Primary Care Provider.  If you are age 75 or younger, your body mass index should be between 19-25. Your Body mass index is 26.15 kg/m. If this is out of the aformentioned range listed, please consider follow up with your Primary Care Provider.   ________________________________________________________  The [facility] providers would like to encourage you to use MYCHART to communicate with providers for non-urgent requests or questions.  Due to long hold times on the telephone, sending your provider a message by [facility] may be a faster and more efficient way to get a response.  Please allow 48 business hours for a response.  Please remember that this is for non-urgent requests.  _______________________________________________________

## 2023-10-05 DIAGNOSIS — G43109 Migraine with aura, not intractable, without status migrainosus: Secondary | ICD-10-CM | POA: Diagnosis not present

## 2023-10-26 ENCOUNTER — Other Ambulatory Visit (HOSPITAL_COMMUNITY): Payer: Self-pay

## 2023-10-30 DIAGNOSIS — G43109 Migraine with aura, not intractable, without status migrainosus: Secondary | ICD-10-CM | POA: Diagnosis not present

## 2023-10-31 DIAGNOSIS — G43109 Migraine with aura, not intractable, without status migrainosus: Secondary | ICD-10-CM | POA: Diagnosis not present

## 2023-11-07 ENCOUNTER — Ambulatory Visit: Payer: Self-pay | Admitting: *Deleted

## 2023-11-07 NOTE — Telephone Encounter (Signed)
 Message from [facility] G sent at 11/07/2023  8:04 AM EDT  Pain in her eye - causing headaches    Call History  Contact Date/Time Type Contact Phone/Fax By  11/07/2023 08:02 AM EDT Phone (Incoming) Foster, Michelle Foster (Self) [phone] (M) [name]   Reason for Disposition  [1] MODERATE headache (e.g., interferes with normal activities) AND [2] present > 24 hours AND [3] unexplained  (Exceptions: Pain medicines not tried, typical migraine, or headache part of viral illness.)  Answer Assessment - Initial Assessment Questions 1. LOCATION: Where does it hurt?      I'm having headaches.  It's not migraines.   I had a CT scan done.   I've had this for 6 months.   It's not sinuses.   I'm having blerospasms.   My left eye.  2. ONSET: When did the headache start? (e.g., minutes, hours, days)      7 months now  It radiates to the top of my head. 3. PATTERN: Does the pain come and go, or has it been constant since it started?     It's constant but it comes and goes.   It's not all the time.   I'm taking Tylenol  which helps a little with the headache but it does not go away. 4. SEVERITY: How bad is the pain? and What does it keep you from doing?  (e.g., Scale 1-10; mild, moderate, or severe)     4/10   5. RECURRENT SYMPTOM: Have you ever had headaches before? If Yes, ask: When was the last time? and What happened that time?      No 6. CAUSE: What do you think is causing the headache?     Don't know    See above    I know it's not sinus issues.   This pain is behind my eye. 7. MIGRAINE: Have you been diagnosed with migraine headaches? If Yes, ask: Is this headache similar?      No 8. HEAD INJURY: Has there been any recent injury to your head?      No  9. OTHER SYMPTOMS: Do you have any other symptoms? (e.g., fever, stiff neck, eye pain, sore throat, cold symptoms)     My left eye hurts    The blerospasm of my left eye is not contributing to this, I don't think.   I  get shots in my eye for this of Botox.    I'm to see a specialist for this.    This is a rare condition. 10. PREGNANCY: Is there any chance you are pregnant? When was your last menstrual period?       N/A  Protocols used: Headache-A-AH FYI Only or Action Required?: FYI only for provider.  Patient was last seen in primary care on 10/24/2022 by [name] DASEN, MD.  Called Nurse Triage reporting Headache. Having headaches that radiate to the top of her head for the last 7 months now  Symptoms began several months ago.  (Also being treated for spasms of her eyelid in left eye).  Interventions attempted: OTC medications: Tylenol  .  Helps some but the headache never really goes away.  Symptoms are: gradually worsening.  Triage Disposition: See Within 3 Days in Office    Patient/caregiver understands and will follow disposition?: Yes

## 2023-11-08 DIAGNOSIS — Z96652 Presence of left artificial knee joint: Secondary | ICD-10-CM | POA: Diagnosis not present

## 2023-11-09 ENCOUNTER — Encounter: Payer: Self-pay | Admitting: Family Medicine

## 2023-11-09 ENCOUNTER — Other Ambulatory Visit (HOSPITAL_COMMUNITY): Payer: Self-pay

## 2023-11-09 ENCOUNTER — Ambulatory Visit (INDEPENDENT_AMBULATORY_CARE_PROVIDER_SITE_OTHER): Admitting: Family Medicine

## 2023-11-09 ENCOUNTER — Telehealth: Payer: Self-pay

## 2023-11-09 VITALS — BP 120/72 | HR 68 | Temp 97.6°F | Ht 63.5 in | Wt 158.0 lb

## 2023-11-09 DIAGNOSIS — E7800 Pure hypercholesterolemia, unspecified: Secondary | ICD-10-CM

## 2023-11-09 DIAGNOSIS — M810 Age-related osteoporosis without current pathological fracture: Secondary | ICD-10-CM

## 2023-11-09 DIAGNOSIS — R519 Headache, unspecified: Secondary | ICD-10-CM | POA: Diagnosis not present

## 2023-11-09 DIAGNOSIS — Z9181 History of falling: Secondary | ICD-10-CM | POA: Insufficient documentation

## 2023-11-09 DIAGNOSIS — Z87442 Personal history of urinary calculi: Secondary | ICD-10-CM | POA: Insufficient documentation

## 2023-11-09 DIAGNOSIS — Q438 Other specified congenital malformations of intestine: Secondary | ICD-10-CM | POA: Insufficient documentation

## 2023-11-09 DIAGNOSIS — Z87898 Personal history of other specified conditions: Secondary | ICD-10-CM | POA: Insufficient documentation

## 2023-11-09 MED ORDER — DENOSUMAB 60 MG/ML ~~LOC~~ SOSY
60.0000 mg | PREFILLED_SYRINGE | Freq: Once | SUBCUTANEOUS | Status: DC
Start: 2023-11-23 — End: 2024-01-14

## 2023-11-09 NOTE — Progress Notes (Signed)
 Subjective:    Patient ID: Michelle Foster, female    DOB: June 30, 1950, 73 y.o.   MRN: [id number]  Headache   Patient has been dealing with severe sharp pain over the last month behind her left eye and above her left eye and near her left temple.  She saw an ophthalmologist who evaluated her eye and saw no eddies.  They also ordered a CT scan of the sinuses that showed no retro-orbital mass or sinus infection.  Patient states that she is also having zigzag bright lines in her vision as well as photosensitivity.  She also has a dull headache in her occiput.  She denies any tenderness to palpation of the temporal artery.  She denies any pain in the 2nd or 3rd division of the trigeminal nerve however she does complain of pain in the first division.  She is also concerned by chronic intertrigo like rash in her perineum.  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  She showed me today the rash with her husband present and it looks classic for Candida intertrigo.  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,    Colon polyps    COVID-19 10/19/2022   Estrogen deficiency    Eustachian tube dysfunction    History of kidney stones    Hyperlipidemia    no meds   Hypertension    Labile HTN   Osteoporosis    Pre-diabetes    Past Surgical History:  Procedure Laterality Date   ABDOMINAL HYSTERECTOMY     TAH BSO by accident at 33    bilateral cataract surgery      BREAST BIOPSY Right 01/2021   COLONOSCOPY  2012   done in [place] , physician has since retired   COLONOSCOPY WITH PROPOFOL  N/A 11/13/2018   Procedure: COLONOSCOPY WITH PROPOFOL ;  Surgeon: Mansouraty, Aloha Raddle.,  MD;  Location: [facility];  Service: Gastroenterology;  Laterality: N/A;  NEEDS TO BE A 90 MIN SPOT   left knee meniscus tear      meniscal tear     right   OOPHORECTOMY     OVARY SURGERY     1982   POLYPECTOMY  11/13/2018   Procedure: POLYPECTOMY;  Surgeon: [name], MD;  Location: [facility];  Service: Gastroenterology;;   TOTAL KNEE ARTHROPLASTY Left 05/05/2022   Procedure: LEFT TOTAL KNEE ARTHROPLASTY;  Surgeon: [name], MD;  Location: [facility];  Service: Orthopedics;  Laterality: Left;   TOTAL KNEE REVISION Left 11/08/2022   Procedure: TOTAL KNEE REVISION;  Surgeon: [name], MD;  Location: [facility];  Service: Orthopedics;  Laterality: Left;   ureteral tumor removal     Current Outpatient Medications on File Prior to Visit  Medication Sig Dispense Refill   acetaminophen  (TYLENOL ) 500 MG tablet Take 500-1,000 mg by mouth every 6 (six) hours as needed (pain.).     cetirizine (ZYRTEC) 10 MG tablet Take 10 mg by mouth daily as needed for allergies. (Patient not taking: Reported on 11/09/2023)     ketoconazole  (NIZORAL ) 2 % shampoo APPLY TOPICALLY TWICE A WEEK (  Patient not taking: Reported on 11/09/2023) 120 mL 1   methocarbamol  (ROBAXIN ) 500 MG tablet Take 1 tablet (500 mg total) by mouth every 6 (six) hours as needed for muscle spasms. (Patient not taking: Reported on 11/09/2023) 40 tablet 0   ondansetron  (ZOFRAN ) 4 MG tablet Take 1 tablet (4 mg total) by mouth every 6 (six) hours as needed for nausea. (Patient not taking: Reported on 11/09/2023) 20 tablet 0   oxyCODONE  (OXY IR/ROXICODONE ) 5 MG immediate release tablet Take 1-2 tablets (5-10 mg total) by mouth every 6 (six) hours as needed for severe pain. (Patient not taking: Reported on 11/09/2023) 42 tablet 0   rosuvastatin  (CRESTOR ) 20 MG tablet TAKE 1 TABLET BY MOUTH EVERY DAY (Patient not taking: Reported on 11/09/2023) 90 tablet 1   traMADol  (ULTRAM ) 50 MG tablet Take 1-2 tablets (50-100 mg total) by  mouth every 6 (six) hours as needed for moderate pain. (Patient not taking: Reported on 11/09/2023) 40 tablet 0   Current Facility-Administered Medications on File Prior to Visit  Medication Dose Route Frequency Provider Last Rate Last Admin   denosumab  (PROLIA ) injection 60 mg  60 mg Subcutaneous Q6 months [name] DASEN, MD   60 mg at 11/21/22 1121   Allergies  Allergen Reactions   Meperidine Hcl Shortness Of Breath   Codeine Nausea And Vomiting   Oxycodone  Nausea Only   Social History   Socioeconomic History   Marital status: Married    Spouse name: Not on file   Number of children: 1   Years of education: Not on file   Highest education level: Not on file  Occupational History   Occupation: retired  Tobacco Use   Smoking status: Never   Smokeless tobacco: Never  Vaping Use   Vaping status: Never Used  Substance and Sexual Activity   Alcohol use: No   Drug use: No   Sexual activity: Not on file  Other Topics Concern   Not on file  Social History Narrative   Retired   Chief Executive Officer Drivers of Corporate investment banker Strain: Low Risk  (03/16/2022)   Overall Financial Resource Strain (CARDIA)    Difficulty of Paying Living Expenses: Not hard at all  Food Insecurity: No Food Insecurity (11/13/2022)   Hunger Vital Sign    Worried About Running Out of Food in the Last Year: Never true    Ran Out of Food in the Last Year: Never true  Transportation Needs: No Transportation Needs (11/13/2022)   PRAPARE - Administrator, Civil Service (Medical): No    Lack of Transportation (Non-Medical): No  Physical Activity: Inactive (03/16/2022)   Exercise Vital Sign    Days of Exercise per Week: 0 days    Minutes of Exercise per Session: 0 min  Stress: No Stress Concern Present (03/16/2022)   [employer] of Occupational Health - Occupational Stress Questionnaire    Feeling of Stress : Not at all  Social Connections: Moderately Integrated (03/16/2022)   Social  Connection and Isolation Panel    Frequency of Communication with Friends and Family: More than three times a week    Frequency of Social Gatherings with Friends and Family: Three times a week    Attends Religious Services: More than 4 times per year    Active Member of Clubs or Organizations: No    Attends Banker Meetings: Never    Marital Status: Married  Catering manager Violence: Not At Risk (11/08/2022)   Humiliation, Afraid, Rape,  and Kick questionnaire    Fear of Current or Ex-Partner: No    Emotionally Abused: No    Physically Abused: No    Sexually Abused: No     Review of Systems  Neurological:  Positive for headaches.  All other systems reviewed and are negative.      Objective:   Physical Exam Vitals reviewed.  Constitutional:      Appearance: Normal appearance. She is normal weight.  Neck:     Vascular: No carotid bruit.  Cardiovascular:     Rate and Rhythm: Normal rate and regular rhythm.     Pulses: Normal pulses.     Heart sounds: Normal heart sounds. No murmur heard.    No friction rub. No gallop.  Pulmonary:     Effort: No respiratory distress.     Breath sounds: Normal breath sounds. No stridor. No wheezing, rhonchi or rales.  Musculoskeletal:     Right lower leg: No edema.     Left lower leg: No edema.  Skin:    Findings: Rash present.  Neurological:     Mental Status: She is alert.           Assessment & Plan:  Osteoporosis, postmenopausal - Plan: denosumab  (PROLIA ) injection 60 mg  Acute intractable headache, unspecified headache type - Plan: CBC with Differential/Platelet, Comprehensive metabolic panel with GFR, Sedimentation rate  Pure hypercholesterolemia - Plan: Lipid panel  I will check the patient's cholesterol while drawing lab work.  I will check a sedimentation rate and a CBC to evaluate for any evidence of temporal arteritis.  Differential diagnosis includes temporal arteritis versus trigeminal neuralgia versus  atypical migraines.  Based on her symptoms I am concerned that she is having atypical intractable migraines.  I started the patient on Nurtec 75 mg p.o. daily as needed headache.  She will notify me next week if this is helpful.  If it is, we will focus on migraine prevention.  If the sed rate is elevated we will consider a workup for temporal arteritis.  I would recommend a 14-day treat Candida/tinea corporis and then recommend topical antifungal powder daily as a preventative and as a desiccant once I know if the headache improves on Nurtec

## 2023-11-09 NOTE — Telephone Encounter (Signed)
 Medical PA submitted via Latent. Key: [id number]

## 2023-11-10 LAB — CBC WITH DIFFERENTIAL/PLATELET
Absolute Lymphocytes: 1769 {cells}/uL (ref 850–3900)
Absolute Monocytes: 435 {cells}/uL (ref 200–950)
Basophils Absolute: 52 {cells}/uL (ref 0–200)
Basophils Relative: 0.9 %
Eosinophils Absolute: 220 {cells}/uL (ref 15–500)
Eosinophils Relative: 3.8 %
HCT: 37.8 % (ref 35.0–45.0)
Hemoglobin: 12.6 g/dL (ref 11.7–15.5)
MCH: 32.5 pg (ref 27.0–33.0)
MCHC: 33.3 g/dL (ref 32.0–36.0)
MCV: 97.4 fL (ref 80.0–100.0)
MPV: 10.5 fL (ref 7.5–12.5)
Monocytes Relative: 7.5 %
Neutro Abs: 3323 {cells}/uL (ref 1500–7800)
Neutrophils Relative %: 57.3 %
Platelets: 350 Thousand/uL (ref 140–400)
RBC: 3.88 Million/uL (ref 3.80–5.10)
RDW: 12.2 % (ref 11.0–15.0)
Total Lymphocyte: 30.5 %
WBC: 5.8 Thousand/uL (ref 3.8–10.8)

## 2023-11-10 LAB — LIPID PANEL
Cholesterol: 266 mg/dL — ABNORMAL HIGH (ref <200)
HDL: 36 mg/dL — ABNORMAL LOW (ref >=50)
LDL Cholesterol (Calc): 175 mg/dL — ABNORMAL HIGH
Non-HDL Cholesterol (Calc): 230 mg/dL — ABNORMAL HIGH (ref <130)
Total CHOL/HDL Ratio: 7.4 (calc) — ABNORMAL HIGH (ref <5.0)
Triglycerides: 327 mg/dL — ABNORMAL HIGH (ref <150)

## 2023-11-10 LAB — COMPREHENSIVE METABOLIC PANEL WITH GFR
AG Ratio: 1.4 (calc) (ref 1.0–2.5)
ALT: 8 U/L (ref 6–29)
AST: 15 U/L (ref 10–35)
Albumin: 4.1 g/dL (ref 3.6–5.1)
Alkaline phosphatase (APISO): 104 U/L (ref 37–153)
BUN/Creatinine Ratio: 13 (calc) (ref 6–22)
BUN: 14 mg/dL (ref 7–25)
CO2: 27 mmol/L (ref 20–32)
Calcium: 10.6 mg/dL — ABNORMAL HIGH (ref 8.6–10.4)
Chloride: 105 mmol/L (ref 98–110)
Creat: 1.07 mg/dL — ABNORMAL HIGH (ref 0.60–1.00)
Globulin: 3 g/dL (ref 1.9–3.7)
Glucose, Bld: 101 mg/dL — ABNORMAL HIGH (ref 65–99)
Potassium: 4.4 mmol/L (ref 3.5–5.3)
Sodium: 140 mmol/L (ref 135–146)
Total Bilirubin: 0.3 mg/dL (ref 0.2–1.2)
Total Protein: 7.1 g/dL (ref 6.1–8.1)
eGFR: 55 mL/min/1.73m2 — ABNORMAL LOW (ref >=60)

## 2023-11-10 LAB — SEDIMENTATION RATE: Sed Rate: 41 mm/h — ABNORMAL HIGH (ref 0–30)

## 2023-11-12 ENCOUNTER — Other Ambulatory Visit (HOSPITAL_COMMUNITY): Payer: Self-pay

## 2023-11-12 ENCOUNTER — Telehealth: Payer: Self-pay

## 2023-11-12 ENCOUNTER — Ambulatory Visit: Payer: Self-pay | Admitting: Family Medicine

## 2023-11-12 NOTE — Telephone Encounter (Signed)
 MEDICAL PA  Received notification from [employer] that Prior Authorization for PROLIA  has been APPROVED from 11/21/22 to 03/19/24   PA #/Case ID/Reference #: [id number]

## 2023-11-12 NOTE — Telephone Encounter (Signed)
 Pt ready for scheduling for PROLIA  on or after : 11/12/23  Option# 1: Buy/Bill (Office supplied medication)  Out-of-pocket cost due at time of clinic visit: $357  Number of injection/visits approved: 2  Primary: [employer] Prolia  co-insurance: 20% Admin fee co-insurance: 20%  Secondary: --- Prolia  co-insurance:  Admin fee co-insurance:   Medical Benefit Details: Date Benefits were checked: 10/26/23 Deductible: NO/ Coinsurance: 20%/ Admin Fee: 20%  Prior Auth: APPROVED PA# [id number] Expiration Date: 11/21/22-03/19/24   # of doses approved: 2 ----------------------------------------------------------------------- Option# 2- Med Obtained from pharmacy:  Pharmacy benefit: Copay $985.81 (Paid to pharmacy) Admin Fee: 20% (Pay at clinic)  Prior Auth: N/A PA# Expiration Date:   # of doses approved:   If patient wants fill through the pharmacy benefit please send prescription to: [facility], and include estimated need by date in rx notes. Pharmacy will ship medication directly to the office.  Patient NOT eligible for Prolia  Copay Card. Copay Card can make patient's cost as little as $25. Link to apply: [email or web address]://[email or web address].[email or web address]/copay  ** This summary of benefits is an estimation of the patient's out-of-pocket cost. Exact cost may very based on individual plan coverage.

## 2023-11-12 NOTE — Telephone Encounter (Signed)
 Copied from CRM #[id number]. Topic: Clinical - Prescription Issue >> Nov 12, 2023  8:34 AM Charlet HERO wrote: Reason for CRM: Patient is calling to inform the Dr Duanne of the medication did not work for her that was given on Friday, she says not adverse effects. She is stating that her teeth were hurting on the left side and right lower jaw on Saturday  and no headache on Sunday. She is stating that she has been taking her cholestrol med so she can get her numbers back in check.

## 2023-11-13 ENCOUNTER — Other Ambulatory Visit: Payer: Self-pay | Admitting: Family Medicine

## 2023-11-13 ENCOUNTER — Other Ambulatory Visit: Payer: Self-pay

## 2023-11-13 DIAGNOSIS — R519 Headache, unspecified: Secondary | ICD-10-CM

## 2023-11-13 MED ORDER — GABAPENTIN 300 MG PO CAPS: 300.0000 mg | ORAL_CAPSULE | Freq: Two times a day (BID) | ORAL | 1 refills | Status: DC

## 2023-11-13 MED ORDER — GABAPENTIN 300 MG PO CAPS
300.0000 mg | ORAL_CAPSULE | Freq: Two times a day (BID) | ORAL | 1 refills | Status: DC
Start: 2023-11-13 — End: 2023-11-13

## 2023-11-13 MED ORDER — TERBINAFINE HCL 250 MG PO TABS: 250.0000 mg | ORAL_TABLET | Freq: Every day | ORAL | 0 refills | Status: DC

## 2023-11-15 DIAGNOSIS — H0279 Other degenerative disorders of eyelid and periocular area: Secondary | ICD-10-CM | POA: Diagnosis not present

## 2023-11-15 DIAGNOSIS — G8929 Other chronic pain: Secondary | ICD-10-CM | POA: Diagnosis not present

## 2023-11-15 DIAGNOSIS — R519 Headache, unspecified: Secondary | ICD-10-CM | POA: Diagnosis not present

## 2023-11-15 DIAGNOSIS — H02423 Myogenic ptosis of bilateral eyelids: Secondary | ICD-10-CM | POA: Diagnosis not present

## 2023-11-15 DIAGNOSIS — Z961 Presence of intraocular lens: Secondary | ICD-10-CM | POA: Diagnosis not present

## 2023-11-15 DIAGNOSIS — G245 Blepharospasm: Secondary | ICD-10-CM | POA: Diagnosis not present

## 2023-11-16 ENCOUNTER — Encounter: Payer: Self-pay | Admitting: Neurology

## 2023-11-23 DIAGNOSIS — N390 Urinary tract infection, site not specified: Secondary | ICD-10-CM | POA: Diagnosis not present

## 2023-11-30 ENCOUNTER — Other Ambulatory Visit: Payer: Self-pay | Admitting: Family Medicine

## 2023-11-30 DIAGNOSIS — Z1231 Encounter for screening mammogram for malignant neoplasm of breast: Secondary | ICD-10-CM

## 2023-12-05 DIAGNOSIS — H02423 Myogenic ptosis of bilateral eyelids: Secondary | ICD-10-CM | POA: Diagnosis not present

## 2023-12-14 ENCOUNTER — Other Ambulatory Visit: Payer: Self-pay | Admitting: Family Medicine

## 2023-12-14 MED ORDER — CIPROFLOXACIN HCL 500 MG PO TABS: 500.0000 mg | ORAL_TABLET | Freq: Two times a day (BID) | ORAL | 0 refills | Status: AC

## 2023-12-14 MED ORDER — PREDNISONE 20 MG PO TABS: 60.0000 mg | ORAL_TABLET | Freq: Every day | ORAL | 0 refills | Status: DC

## 2023-12-20 ENCOUNTER — Ambulatory Visit: Admitting: *Deleted

## 2023-12-20 VITALS — Ht 63.5 in | Wt 150.0 lb

## 2023-12-20 DIAGNOSIS — Z0000 Encounter for general adult medical examination without abnormal findings: Secondary | ICD-10-CM | POA: Diagnosis not present

## 2023-12-20 NOTE — Patient Instructions (Signed)
 Michelle Foster , Thank you for taking time to come for your Medicare Wellness Visit. I appreciate your ongoing commitment to your health goals. Please review the following plan we discussed and let me know if I can assist you in the future.   Screening recommendations/referrals: Colonoscopy:  Mammogram:  Bone Density:  Recommended yearly ophthalmology/optometry visit for glaucoma screening and checkup Recommended yearly dental visit for hygiene and checkup  Vaccinations: Influenza vaccine:  Pneumococcal vaccine:  Tdap vaccine:  Shingles vaccine:       Preventive Care 65 Years and Older, Female Preventive care refers to lifestyle choices and visits with your health care provider that can promote health and wellness. What does preventive care include? A yearly physical exam. This is also called an annual well check. Dental exams once or twice a year. Routine eye exams. Ask your health care provider how often you should have your eyes checked. Personal lifestyle choices, including: Daily care of your teeth and gums. Regular physical activity. Eating a healthy diet. Avoiding tobacco and drug use. Limiting alcohol use. Practicing safe sex. Taking low-dose aspirin  every day. Taking vitamin and mineral supplements as recommended by your health care provider. What happens during an annual well check? The services and screenings done by your health care provider during your annual well check will depend on your age, overall health, lifestyle risk factors, and family history of disease. Counseling  Your health care provider may ask you questions about your: Alcohol use. Tobacco use. Drug use. Emotional well-being. Home and relationship well-being. Sexual activity. Eating habits. History of falls. Memory and ability to understand (cognition). Work and work Astronomer. Reproductive health. Screening  You may have the following tests or measurements: Height, weight, and  BMI. Blood pressure. Lipid and cholesterol levels. These may be checked every 5 years, or more frequently if you are over 76 years old. Skin check. Lung cancer screening. You may have this screening every year starting at age 5 if you have a 30-pack-year history of smoking and currently smoke or have quit within the past 15 years. Fecal occult blood test (FOBT) of the stool. You may have this test every year starting at age 32. Flexible sigmoidoscopy or colonoscopy. You may have a sigmoidoscopy every 5 years or a colonoscopy every 10 years starting at age 24. Hepatitis C blood test. Hepatitis B blood test. Sexually transmitted disease (STD) testing. Diabetes screening. This is done by checking your blood sugar (glucose) after you have not eaten for a while (fasting). You may have this done every 1-3 years. Bone density scan. This is done to screen for osteoporosis. You may have this done starting at age 44. Mammogram. This may be done every 1-2 years. Talk to your health care provider about how often you should have regular mammograms. Talk with your health care provider about your test results, treatment options, and if necessary, the need for more tests. Vaccines  Your health care provider may recommend certain vaccines, such as: Influenza vaccine. This is recommended every year. Tetanus, diphtheria, and acellular pertussis (Tdap, Td) vaccine. You may need a Td booster every 10 years. Zoster vaccine. You may need this after age 18. Pneumococcal 13-valent conjugate (PCV13) vaccine. One dose is recommended after age 37. Pneumococcal polysaccharide (PPSV23) vaccine. One dose is recommended after age 82. Talk to your health care provider about which screenings and vaccines you need and how often you need them. This information is not intended to replace advice given to you by your health care  provider. Make sure you discuss any questions you have with your health care provider. Document  Released: 04/02/2015 Document Revised: 11/24/2015 Document Reviewed: 01/05/2015 Elsevier Interactive Patient Education  2017 [employer].  Fall Prevention in the Home Falls can cause injuries. They can happen to people of all ages. There are many things you can do to make your home safe and to help prevent falls. What can I do on the outside of my home? Regularly fix the edges of walkways and driveways and fix any cracks. Remove anything that might make you trip as you walk through a door, such as a raised step or threshold. Trim any bushes or trees on the path to your home. Use bright outdoor lighting. Clear any walking paths of anything that might make someone trip, such as rocks or tools. Regularly check to see if handrails are loose or broken. Make sure that both sides of any steps have handrails. Any raised decks and porches should have guardrails on the edges. Have any leaves, snow, or ice cleared regularly. Use sand or salt on walking paths during winter. Clean up any spills in your garage right away. This includes oil or grease spills. What can I do in the bathroom? Use night lights. Install grab bars by the toilet and in the tub and shower. Do not use towel bars as grab bars. Use non-skid mats or decals in the tub or shower. If you need to sit down in the shower, use a plastic, non-slip stool. Keep the floor dry. Clean up any water  that spills on the floor as soon as it happens. Remove soap buildup in the tub or shower regularly. Attach bath mats securely with double-sided non-slip rug tape. Do not have throw rugs and other things on the floor that can make you trip. What can I do in the bedroom? Use night lights. Make sure that you have a light by your bed that is easy to reach. Do not use any sheets or blankets that are too big for your bed. They should not hang down onto the floor. Have a firm chair that has side arms. You can use this for support while you get dressed. Do  not have throw rugs and other things on the floor that can make you trip. What can I do in the kitchen? Clean up any spills right away. Avoid walking on wet floors. Keep items that you use a lot in easy-to-reach places. If you need to reach something above you, use a strong step stool that has a grab bar. Keep electrical cords out of the way. Do not use floor polish or wax that makes floors slippery. If you must use wax, use non-skid floor wax. Do not have throw rugs and other things on the floor that can make you trip. What can I do with my stairs? Do not leave any items on the stairs. Make sure that there are handrails on both sides of the stairs and use them. Fix handrails that are broken or loose. Make sure that handrails are as long as the stairways. Check any carpeting to make sure that it is firmly attached to the stairs. Fix any carpet that is loose or worn. Avoid having throw rugs at the top or bottom of the stairs. If you do have throw rugs, attach them to the floor with carpet tape. Make sure that you have a light switch at the top of the stairs and the bottom of the stairs. If you do not have  them, ask someone to add them for you. What else can I do to help prevent falls? Wear shoes that: Do not have high heels. Have rubber bottoms. Are comfortable and fit you well. Are closed at the toe. Do not wear sandals. If you use a stepladder: Make sure that it is fully opened. Do not climb a closed stepladder. Make sure that both sides of the stepladder are locked into place. Ask someone to hold it for you, if possible. Clearly mark and make sure that you can see: Any grab bars or handrails. First and last steps. Where the edge of each step is. Use tools that help you move around (mobility aids) if they are needed. These include: Canes. Walkers. Scooters. Crutches. Turn on the lights when you go into a dark area. Replace any light bulbs as soon as they burn out. Set up your  furniture so you have a clear path. Avoid moving your furniture around. If any of your floors are uneven, fix them. If there are any pets around you, be aware of where they are. Review your medicines with your doctor. Some medicines can make you feel dizzy. This can increase your chance of falling. Ask your doctor what other things that you can do to help prevent falls. This information is not intended to replace advice given to you by your health care provider. Make sure you discuss any questions you have with your health care provider. Document Released: 12/31/2008 Document Revised: 08/12/2015 Document Reviewed: 04/10/2014 Elsevier Interactive Patient Education  2017 [employer].

## 2023-12-20 NOTE — Progress Notes (Signed)
 Subjective:   Michelle Foster is a 73 y.o. female who presents for Medicare Annual (Subsequent) preventive examination.  Visit Complete: Virtual I connected with  Michelle Foster on 12/20/23 by a audio enabled telemedicine application and verified that I am speaking with the correct person using two identifiers.  Patient Location: Home  Provider Location: Home Office  I discussed the limitations of evaluation and management by telemedicine. The patient expressed understanding and agreed to proceed.  Vital Signs: Because this visit was a virtual/telehealth visit, some criteria may be missing or patient reported. Any vitals not documented were not able to be obtained and vitals that have been documented are patient reported.   Cardiac Risk Factors include: advanced age (>68men, >2 women);obesity (BMI >30kg/m2);family history of premature cardiovascular disease     Objective:    Today's Vitals   12/20/23 1223  Weight: 150 lb (68 kg)  Height: 5' 3.5 (1.613 m)   Body mass index is 26.15 kg/m.     12/20/2023   12:22 PM 11/08/2022    8:49 AM 10/26/2022   10:34 AM 05/05/2022    3:35 PM 05/01/2022    9:06 AM 03/16/2022    1:38 PM 01/20/2021    9:09 AM  Advanced Directives  Does Patient Have a Medical Advance Directive? Yes Yes Yes Yes Yes Yes Yes  Type of Estate agent of [employer] Power of [place];Living will Healthcare Power of [place];Living will Healthcare Power of [place];Living will Healthcare Power of [employer];Living will Living will;Healthcare Power of Attorney   Does patient want to make changes to medical advance directive?  No - Patient declined  No - Guardian declined  No - Patient declined No - Patient declined  Copy of Healthcare Power of Attorney in Chart? No - copy requested No - copy requested No - copy requested   No - copy requested   Would patient like information on creating a medical advance directive?       No - Patient  declined    Current Medications (verified) Outpatient Encounter Medications as of 12/20/2023  Medication Sig   acetaminophen  (TYLENOL ) 500 MG tablet Take 500-1,000 mg by mouth every 6 (six) hours as needed (pain.).   ciprofloxacin  (CIPRO ) 500 MG tablet Take 1 tablet (500 mg total) by mouth 2 (two) times daily for 7 days.   gabapentin  (NEURONTIN ) 300 MG capsule Take 1 capsule (300 mg total) by mouth 2 (two) times daily.   predniSONE  (DELTASONE ) 20 MG tablet Take 3 tablets (60 mg total) by mouth daily with breakfast.   cetirizine (ZYRTEC) 10 MG tablet Take 10 mg by mouth daily as needed for allergies. (Patient not taking: Reported on 12/20/2023)   ketoconazole  (NIZORAL ) 2 % shampoo APPLY TOPICALLY TWICE A WEEK (Patient not taking: Reported on 12/20/2023)   methocarbamol  (ROBAXIN ) 500 MG tablet Take 1 tablet (500 mg total) by mouth every 6 (six) hours as needed for muscle spasms. (Patient not taking: Reported on 12/20/2023)   ondansetron  (ZOFRAN ) 4 MG tablet Take 1 tablet (4 mg total) by mouth every 6 (six) hours as needed for nausea. (Patient not taking: Reported on 12/20/2023)   oxyCODONE  (OXY IR/ROXICODONE ) 5 MG immediate release tablet Take 1-2 tablets (5-10 mg total) by mouth every 6 (six) hours as needed for severe pain. (Patient not taking: Reported on 12/20/2023)   rosuvastatin  (CRESTOR ) 20 MG tablet TAKE 1 TABLET BY MOUTH EVERY DAY (Patient not taking: Reported on 12/20/2023)   terbinafine  (LAMISIL ) 250 MG tablet Take  1 tablet (250 mg total) by mouth daily. (Patient not taking: Reported on 12/20/2023)   traMADol  (ULTRAM ) 50 MG tablet Take 1-2 tablets (50-100 mg total) by mouth every 6 (six) hours as needed for moderate pain. (Patient not taking: Reported on 12/20/2023)   Facility-Administered Encounter Medications as of 12/20/2023  Medication   denosumab  (PROLIA ) injection 60 mg   denosumab  (PROLIA ) injection 60 mg    Allergies (verified) Meperidine hcl, Codeine, and Oxycodone     History: 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,    Colon polyps    COVID-19 10/19/2022   Estrogen deficiency    Eustachian tube dysfunction    History of kidney stones    Hyperlipidemia    no meds   Hypertension    Labile HTN   Osteoporosis    Pre-diabetes    Past Surgical History:  Procedure Laterality Date   ABDOMINAL HYSTERECTOMY     TAH BSO by accident at 33    bilateral cataract surgery      BREAST BIOPSY Right 01/2021   COLONOSCOPY  2012   done in [place] , physician has since retired   COLONOSCOPY WITH PROPOFOL  N/A 11/13/2018   Procedure: COLONOSCOPY WITH PROPOFOL ;  Surgeon: Mansouraty, Aloha Raddle., MD;  Location: [facility];  Service: Gastroenterology;  Laterality: N/A;  NEEDS TO BE A 90 MIN SPOT   left knee meniscus tear      meniscal tear     right   OOPHORECTOMY     OVARY SURGERY     1982   POLYPECTOMY  11/13/2018   Procedure: POLYPECTOMY;  Surgeon: [name], MD;  Location: [facility];  Service: Gastroenterology;;   TOTAL KNEE ARTHROPLASTY Left 05/05/2022   Procedure: LEFT TOTAL KNEE ARTHROPLASTY;  Surgeon: [name], MD;  Location: [facility];  Service: Orthopedics;  Laterality: Left;   TOTAL KNEE REVISION Left 11/08/2022   Procedure: TOTAL KNEE REVISION;  Surgeon: [name], MD;  Location: [facility];  Service: Orthopedics;  Laterality: Left;   ureteral tumor removal     Family History  Problem Relation Age of Onset   Breast cancer Mother 42   Heart disease Mother    Alzheimer's disease Mother    Arrhythmia Father    Heart failure Father    Heart disease Father    Atrial fibrillation Father    Stroke Father 75   Heart failure Brother    Brain cancer Brother    Stomach cancer Neg Hx    Colon cancer Neg Hx    Pancreatic cancer Neg Hx     Esophageal cancer Neg Hx    Social History   Socioeconomic History   Marital status: Married    Spouse name: Not on file   Number of children: 1   Years of education: Not on file   Highest education level: Not on file  Occupational History   Occupation: retired  Tobacco Use   Smoking status: Never   Smokeless tobacco: Never  Vaping Use   Vaping status: Never Used  Substance and Sexual Activity   Alcohol use: No   Drug use: No   Sexual activity: Not on file  Other Topics Concern   Not on file  Social History Narrative  Retired   Teacher, early years/pre Strain: 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  (12/20/2023)   Overall Financial Resource Strain (CARDIA)    Difficulty of Paying Living Expenses: Not hard at all  Food Insecurity: No Food Insecurity (12/20/2023)   Hunger Vital Sign    Worried About Running Out of Food in the Last Year: Never true    Ran Out of Food in the Last Year: Never true  Transportation Needs: No Transportation Needs (12/20/2023)   PRAPARE - Administrator, Civil Service (Medical): No    Lack of Transportation (Non-Medical): No  Physical Activity: Inactive (03/16/2022)   Exercise Vital Sign    Days of Exercise per Week: 0 days    Minutes of Exercise per Session: 0 min  Stress: Stress Concern Present (12/20/2023)   [employer] of Occupational Health - Occupational Stress Questionnaire    Feeling of Stress: To some extent  Social Connections: Moderately Integrated (12/20/2023)   Social Connection and Isolation Panel    Frequency of Communication with Friends and Family: More than three times a week    Frequency of Social Gatherings with Friends and Family: Twice a week    Attends Religious Services: More than 4 times per year    Active Member of [employer] or Organizations: No    Attends Engineer, structural: Never    Marital Status: Married    Tobacco Counseling Counseling given: Not Answered   Clinical Intake:  Pre-visit  preparation completed: Yes  Pain : No/denies pain     Diabetes: No  How often do you need to have someone help you when you read instructions, pamphlets, or other written materials from your doctor or pharmacy?: 1 - Never  Interpreter Needed?: No  Information entered by :: Mliss Graff LPN   Activities of Daily Living    12/20/2023   12:26 PM  In your present state of health, do you have any difficulty performing the following activities:  Hearing? 0  Vision? 0  Difficulty concentrating or making decisions? 0  Walking or climbing stairs? 0  Dressing or bathing? 0  Doing errands, shopping? 0  Preparing Food and eating ? N  Using the Toilet? N  In the past six months, have you accidently leaked urine? N  Do you have problems with loss of bowel control? N  Managing your Medications? N  Managing your Finances? N    Patient Care Team: [name] DASEN, MD as PCP - General (Family Medicine)  Indicate any recent Medical Services you may have received from other than [facility] providers in the past year (date may be approximate).     Assessment:   This is a routine wellness examination for Gorgeous.  Hearing/Vision screen Hearing Screening - Comments:: No trouble hearing Vision Screening - Comments:: Up to date [facility]   Goals Addressed             This Visit's Progress    Weight (lb) < 200 lb (90.7 kg)   150 lb (68 kg)    Increase activity       Depression Screen    12/20/2023   12:28 PM 11/09/2023   10:49 AM 03/16/2022    1:36 PM 06/03/2021    9:35 AM 01/20/2021    9:07 AM 04/08/2020    8:14 AM 10/27/2019   10:46 AM  PHQ 2/9 Scores  PHQ - 2 Score 0 0 0 0 0 0 0  PHQ- 9 Score 4   0  Fall Risk    12/20/2023   12:25 PM 11/09/2023   10:49 AM 03/16/2022    1:36 PM 06/03/2021    9:35 AM 01/20/2021    9:07 AM  Fall Risk   Falls in the past year? 0 0 0 0 0  Number falls in past yr: 0 0 0 0 0  Injury with Fall? 0 0 0 0 0  Risk for fall due to :  No Fall  Risks No Fall Risks  No Fall Risks  Follow up Falls evaluation completed;Education provided;Falls prevention discussed Falls evaluation completed Falls evaluation completed;Education provided;Falls prevention discussed   Falls evaluation completed      Data saved with a previous flowsheet row definition    MEDICARE RISK AT HOME: Medicare Risk at Home Any stairs in or around the home?: Yes If so, are there any without handrails?: No Home free of loose throw rugs in walkways, pet beds, electrical cords, etc?: Yes Adequate lighting in your home to reduce risk of falls?: Yes Life alert?: No Use of a cane, walker or w/c?: No Grab bars in the bathroom?: Yes Shower chair or bench in shower?: No Elevated toilet seat or a handicapped toilet?: No  TIMED UP AND GO:  Was the test performed?  No    Cognitive Function:        12/20/2023   12:26 PM 03/16/2022    1:38 PM  6CIT Screen  What Year? 0 points 0 points  What month? 0 points 0 points  What time? 0 points 0 points  Count back from 20 0 points 0 points  Months in reverse 2 points 0 points  Repeat phrase 0 points 0 points  Total Score 2 points 0 points    Immunizations Immunization History  Administered Date(s) Administered   Fluad Quad(high Dose 65+) 11/27/2018, 12/18/2019, 01/03/2021   INFLUENZA, HIGH DOSE SEASONAL PF 01/25/2018   Influenza,inj,Quad PF,6+ Mos 03/08/2017   PFIZER(Purple Top)SARS-COV-2 Vaccination 04/14/2019, 05/05/2019   Pneumococcal Conjugate-13 10/27/2019   Pneumococcal Polysaccharide-23 03/08/2017    TDAP status: Due, Education has been provided regarding the importance of this vaccine. Advised may receive this vaccine at local pharmacy or Health Dept. Aware to provide a copy of the vaccination record if obtained from local pharmacy or Health Dept. Verbalized acceptance and understanding.  Flu Vaccine status: Due, Education has been provided regarding the importance of this vaccine. Advised may receive  this vaccine at local pharmacy or Health Dept. Aware to provide a copy of the vaccination record if obtained from local pharmacy or Health Dept. Verbalized acceptance and understanding.  Pneumococcal vaccine status: Up to date  Covid-19 vaccine status: Information provided on how to obtain vaccines.   Qualifies for Shingles Vaccine? No   Zostavax completed Yes   Shingrix Completed?: Yes  Screening Tests Health Maintenance  Topic Date Due   Mammogram  12/29/2023   Zoster Vaccines- Shingrix (1 of 2) 02/09/2024 (Originally 11/20/1969)   Influenza Vaccine  06/17/2024 (Originally 10/19/2023)   DTaP/Tdap/Td (1 - Tdap) 11/08/2024 (Originally 11/20/1969)   Hepatitis C Screening  11/08/2024 (Originally 11/20/1968)   Medicare Annual Wellness (AWV)  12/19/2024   Colonoscopy  11/12/2028   Pneumococcal Vaccine: 50+ Years  Completed   DEXA SCAN  Completed   HPV VACCINES  Aged Out   Meningococcal B Vaccine  Aged Out   COVID-19 Vaccine  Discontinued    Health Maintenance  Health Maintenance Due  Topic Date Due   Mammogram  12/29/2023    Colorectal cancer  screening: No longer required.   Mammogram   scheduled  Bone Density status: Completed 2024. Results reflect: Bone density results: OSTEOPOROSIS. Repeat every 2 years.  Lung Cancer Screening: (Low Dose CT Chest recommended if Age 17-80 years, 20 pack-year currently smoking OR have quit w/in 15years.) does not qualify.   Lung Cancer Screening Referral:   Additional Screening:  Hepatitis C Screening   never done  Vision Screening: Recommended annual ophthalmology exams for early detection of glaucoma and other disorders of the eye. Is the patient up to date with their annual eye exam?  Yes  Who is the provider or what is the name of the office in which the patient attends annual eye exams? [facility] If pt is not established with a provider, would they like to be referred to a provider to establish care? No .   Dental Screening:  Recommended annual dental exams for proper oral hygiene    Community Resource Referral / Chronic Care Management: CRR required this visit?  No   CCM required this visit?  No     Plan:     I have personally reviewed and noted the following in the patient's chart:   Medical and social history Use of alcohol, tobacco or illicit drugs  Current medications and supplements including opioid prescriptions. Patient is not currently taking opioid prescriptions. Functional ability and status Nutritional status Physical activity Advanced directives List of other physicians Hospitalizations, surgeries, and ER visits in previous 12 months Vitals Screenings to include cognitive, depression, and falls Referrals and appointments  In addition, I have reviewed and discussed with patient certain preventive protocols, quality metrics, and best practice recommendations. A written personalized care plan for preventive services as well as general preventive health recommendations were provided to patient.     Mliss Graff, LPN   [id number]/09/[id number]   After Visit Summary: (MyChart) Due to this being a telephonic visit, the after visit summary with patients personalized plan was offered to patient via MyChart   Nurse Notes:

## 2024-01-01 ENCOUNTER — Ambulatory Visit
Admission: RE | Admit: 2024-01-01 | Discharge: 2024-01-01 | Disposition: A | Discharge status: Home / Self Care | Source: Ambulatory Visit | Attending: Family Medicine | Admitting: Family Medicine

## 2024-01-01 DIAGNOSIS — Z1231 Encounter for screening mammogram for malignant neoplasm of breast: Secondary | ICD-10-CM | POA: Diagnosis not present

## 2024-01-14 ENCOUNTER — Ambulatory Visit (INDEPENDENT_AMBULATORY_CARE_PROVIDER_SITE_OTHER)

## 2024-01-14 DIAGNOSIS — M810 Age-related osteoporosis without current pathological fracture: Secondary | ICD-10-CM

## 2024-01-14 MED ORDER — DENOSUMAB 60 MG/ML ~~LOC~~ SOSY
60.0000 mg | PREFILLED_SYRINGE | SUBCUTANEOUS | Status: AC
Start: 2024-07-12 — End: 2025-01-07

## 2024-01-14 MED ORDER — DENOSUMAB 60 MG/ML ~~LOC~~ SOSY
60.0000 mg | PREFILLED_SYRINGE | SUBCUTANEOUS | Status: AC
Start: 2024-07-12 — End: 2024-01-14
  Administered 2024-01-14: 60 mg via SUBCUTANEOUS

## 2024-01-14 NOTE — Progress Notes (Signed)
 Patient is in office today for a nurse visit for Prolia  injection.Given in L arm. Pt tolerated well. Mjp,lpn

## 2024-02-11 ENCOUNTER — Encounter: Payer: Self-pay | Admitting: Family Medicine

## 2024-02-11 ENCOUNTER — Ambulatory Visit: Admitting: Family Medicine

## 2024-02-11 VITALS — BP 122/82 | HR 72 | Temp 97.9°F | Ht 63.5 in | Wt 150.4 lb

## 2024-02-11 DIAGNOSIS — R2689 Other abnormalities of gait and mobility: Secondary | ICD-10-CM

## 2024-02-11 DIAGNOSIS — W57XXXA Bitten or stung by nonvenomous insect and other nonvenomous arthropods, initial encounter: Secondary | ICD-10-CM

## 2024-02-11 DIAGNOSIS — Z23 Encounter for immunization: Secondary | ICD-10-CM

## 2024-02-11 DIAGNOSIS — B49 Unspecified mycosis: Secondary | ICD-10-CM

## 2024-02-11 LAB — CBC WITH DIFFERENTIAL/PLATELET
Absolute Lymphocytes: 2774 {cells}/uL (ref 850–3900)
Absolute Monocytes: 624 {cells}/uL (ref 200–950)
Basophils Absolute: 86 {cells}/uL (ref 0–200)
Basophils Relative: 0.9 %
Eosinophils Absolute: 192 {cells}/uL (ref 15–500)
Eosinophils Relative: 2 %
HCT: 41.5 % (ref 35.9–46.0)
Hemoglobin: 13.8 g/dL (ref 11.7–15.5)
MCH: 31.7 pg (ref 27.0–33.0)
MCHC: 33.3 g/dL (ref 31.6–35.4)
MCV: 95.4 fL (ref 81.4–101.7)
MPV: 10.6 fL (ref 7.5–12.5)
Monocytes Relative: 6.5 %
Neutro Abs: 5923 {cells}/uL (ref 1500–7800)
Neutrophils Relative %: 61.7 %
Platelets: 375 Thousand/uL (ref 140–400)
RBC: 4.35 Million/uL (ref 3.80–5.10)
RDW: 11.8 % (ref 11.0–15.0)
Total Lymphocyte: 28.9 %
WBC: 9.6 Thousand/uL (ref 3.8–10.8)

## 2024-02-11 LAB — IRON,TIBC AND FERRITIN PANEL
%SAT: 26 % (ref 16–45)
Ferritin: 88 ng/mL (ref 16–288)
Iron: 94 ug/dL (ref 45–160)
TIBC: 356 ug/dL (ref 250–450)

## 2024-02-11 LAB — COMPREHENSIVE METABOLIC PANEL WITH GFR
AG Ratio: 1.4 (calc) (ref 1.0–2.5)
ALT: 10 U/L (ref 6–29)
AST: 15 U/L (ref 10–35)
Albumin: 4.4 g/dL (ref 3.6–5.1)
Alkaline phosphatase (APISO): 100 U/L (ref 37–153)
BUN/Creatinine Ratio: 18 (calc) (ref 6–22)
BUN: 21 mg/dL (ref 7–25)
CO2: 25 mmol/L (ref 20–32)
Calcium: 9.8 mg/dL (ref 8.6–10.4)
Chloride: 105 mmol/L (ref 98–110)
Creat: 1.16 mg/dL — ABNORMAL HIGH (ref 0.60–1.00)
Globulin: 3.2 g/dL (ref 1.9–3.7)
Glucose, Bld: 96 mg/dL (ref 65–99)
Potassium: 4.4 mmol/L (ref 3.5–5.3)
Sodium: 139 mmol/L (ref 135–146)
Total Bilirubin: 0.5 mg/dL (ref 0.2–1.2)
Total Protein: 7.6 g/dL (ref 6.1–8.1)
eGFR: 50 mL/min/1.73m2 — ABNORMAL LOW (ref >=60)

## 2024-02-11 LAB — VITAMIN B12: Vitamin B-12: 353 pg/mL (ref 200–1100)

## 2024-02-11 LAB — VITAMIN D 25 HYDROXY (VIT D DEFICIENCY, FRACTURES): Vit D, 25-Hydroxy: 35 ng/mL (ref 30–100)

## 2024-02-11 LAB — TSH: TSH: 2.36 m[IU]/L (ref 0.40–4.50)

## 2024-02-11 NOTE — Progress Notes (Signed)
 Patient Office Visit  Assessment & Plan:  Tick bite, unspecified site, initial encounter -     Ambulatory referral to Infectious Disease  Immunization due -     Flu vaccine HIGH DOSE PF(Fluzone Trivalent)  Fungus infection -     Ambulatory referral to Infectious Disease  Balance problem -     CBC with Differential/Platelet -     TSH -     VITAMIN D  25 Hydroxy (Vit-D Deficiency, Fractures) -     Vitamin B12 -     Iron, TIBC and Ferritin Panel -     Comprehensive metabolic panel with GFR   Assessment and Plan    Chronic fungal rash of left groin and thigh Chronic rash for 50 years, conflicting biopsy results, persistent symptoms despite treatment, recent improvement with Candida cleanse. - Ordered infectious disease consult with preference for female provider. - Continue current antifungal treatments as previously prescribed.  Abnormalities of gait and balance New balance issues without dizziness or vertigo, differential includes tick-borne illness, vitamin deficiencies, or neurological causes. - Ordered blood work including B12, iron, and vitamin D  levels. - Referred to neurologist for further evaluation of balance issues.  Immunization Due for high-dose flu shot as she is over 49 years old. - Administered high-dose flu shot.          No follow-ups on file.   Subjective:    Patient ID: Michelle Foster, female    DOB: 1950/04/18  Age: 73 y.o. MRN: [id number]  Chief Complaint  Patient presents with   Insect Bite    Pt states that she was bit by a tick in August and has since had body aches, and balance issues. She wants to be referred to an infectious disease provider for this issue.    Rash    Reoccurring vaginal rash. She states it has been on and off for 50 years.    HPI Discussed the use of AI scribe software for clinical note transcription with the patient, who gave verbal consent to proceed.  History of Present Illness       History of Present  Illness Michelle Foster is a 73 year old female who presents with concerns re previous tick bite in August, recent balance issues and a chronic fungal rash for about 50 years.   She has been experiencing balance issues for about a month, characterized by sudden loss of balance without dizziness or vertigo. She has not fallen but often needs to grab onto something to steady herself. No ear pain or infections, but she reports occasional chills. Patient does have upcoming appt with Neurology  She has a chronic fungal rash persisting for fifty years, initially starting on her back after her husband returned from [place]. Despite various treatments, including antifungal medications and creams, the rash remains persistent. She has recently started a Candida cleanse supplement, which has improved the rash. The rash is currently on her left thigh and groin area and is described as 'super itchy'. Patient has seen various specialists re this rash but now would like to see ID specialist.   She has a history of headaches that lasted for eight months, which resolved after identifying that her eyelids were putting pressure on her eyes. She had visual field testing per Ophthalmology. She has had a CT scan of her head and face and plans to see a neurologist at the end of the month.  She experiences pain in her shoulder radiating down her arm into her hand. No  numbness in her feet and no history of diabetes.  She has a family history of Alzheimer's disease, with her mother, grandmother, and three aunts having died from the condition. She wants to be proactive about her health and is interested in genetic testing for Alzheimer's.  She lives in [place] and is active, helping care for her 50 year old mother-in-law. She has had knee surgery in the past.  Physical Exam HEENT: Ears normal, no infection. GENITOURINARY: erythematous Rash on left groin and thigh, dry skin. NEUROLOGICAL: Balance impaired, unable to  perform heel-to-toe walk.  Results RADIOLOGY Head CT: Scan of the head and face related to ophthalmological issues.  DIAGNOSTIC Visual field test: Eyelid pressure on eyes causing headaches.  PATHOLOGY Biopsy: One result indicates eczema, another result indicates a fungal infection.  Assessment and Plan Chronic fungal rash of left groin and thigh Chronic rash for 50 years, conflicting biopsy results, persistent symptoms despite treatment, recent improvement with Candida cleanse. - Ordered infectious disease consult with preference for female provider. - Continue current antifungal treatments as previously prescribed.  Abnormalities of gait and balance New balance issues without dizziness or vertigo, differential includes tick-borne illness, vitamin deficiencies, or neurological causes. - Ordered blood work including B12, iron, and vitamin D  levels. - Referred to neurologist for further evaluation of balance issues.  Immunization Due for high-dose flu shot as she is over 25 years old. - Administered high-dose flu shot.    The 10-year ASCVD risk score (Arnett DK, et al., 2019) is: 16.9%  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,    Colon polyps    COVID-19 10/19/2022   Estrogen deficiency    Eustachian tube dysfunction    History of kidney stones    Hyperlipidemia    no meds   Hypertension    Labile HTN   Osteoporosis    Pre-diabetes    Past Surgical History:  Procedure Laterality Date   ABDOMINAL HYSTERECTOMY     TAH BSO by accident at 5    bilateral cataract surgery      BREAST BIOPSY Right 01/2021   COLONOSCOPY  2012   done in [place] , physician has since retired   COLONOSCOPY WITH PROPOFOL  N/A 11/13/2018   Procedure: COLONOSCOPY WITH PROPOFOL ;  Surgeon: Mansouraty, Aloha Raddle.,  MD;  Location: [facility];  Service: Gastroenterology;  Laterality: N/A;  NEEDS TO BE A 90 MIN SPOT   left knee meniscus tear      meniscal tear     right   OOPHORECTOMY     OVARY SURGERY     1982   POLYPECTOMY  11/13/2018   Procedure: POLYPECTOMY;  Surgeon: [name], MD;  Location: [facility];  Service: Gastroenterology;;   TOTAL KNEE ARTHROPLASTY Left 05/05/2022   Procedure: LEFT TOTAL KNEE ARTHROPLASTY;  Surgeon: [name], MD;  Location: [facility];  Service: Orthopedics;  Laterality: Left;   TOTAL KNEE REVISION Left 11/08/2022   Procedure: TOTAL KNEE REVISION;  Surgeon: [name], MD;  Location: [facility];  Service: Orthopedics;  Laterality: Left;   ureteral tumor removal     Social History   Tobacco Use   Smoking status: Never   Smokeless tobacco: Never  Vaping Use   Vaping status: Never Used  Substance Use Topics   Alcohol use: No   Drug use: No   Family History  Problem Relation Age of Onset   Breast cancer Mother 33   Heart disease Mother    Alzheimer's disease Mother    Arrhythmia Father    Heart failure Father    Heart disease Father    Atrial fibrillation Father    Stroke Father 74   Heart failure Brother    Brain cancer Brother    Stomach cancer Neg Hx    Colon cancer Neg Hx    Pancreatic cancer Neg Hx    Esophageal cancer Neg Hx    Allergies  Allergen Reactions   Meperidine Hcl Shortness Of Breath   Codeine Nausea And Vomiting   Oxycodone  Nausea Only    ROS    Objective:    BP 122/82   Pulse 72   Temp 97.9 F (36.6 C)   Ht 5' 3.5 (1.613 m)   Wt 150 lb 6 oz (68.2 kg)   SpO2 98%   BMI 26.22 kg/m  BP Readings from Last 3 Encounters:  02/11/24 122/82  11/09/23 120/72  10/04/23 122/78   Wt Readings from Last 3 Encounters:  02/11/24 150 lb 6 oz (68.2 kg)  12/20/23 150 lb (68 kg)  11/09/23 158 lb (71.7 kg)    Physical Exam Vitals and nursing note reviewed.  Constitutional:      General: She is not in  acute distress.    Appearance: Normal appearance. She is not ill-appearing.  HENT:     Head: Normocephalic.     Right Ear: Tympanic membrane, ear canal and external ear normal.     Left Ear: Tympanic membrane, ear canal and external ear normal.  Eyes:     Extraocular Movements: Extraocular movements intact.     Pupils: Pupils are equal, round, and reactive to light.  Cardiovascular:     Rate and Rhythm: Normal rate and regular rhythm.     Heart sounds: Normal heart sounds.  Pulmonary:     Effort: Pulmonary effort is normal.     Breath sounds: Normal breath sounds. No wheezing or rhonchi.  Musculoskeletal:     Right lower leg: No edema.     Left lower leg: No edema.  Skin:    Findings: Rash present.     Comments: Left breast- no rash noted from previous tick bite (left breast) Thigh- Has a erythematous scaly rash over the left thigh area.  Nonvesicular  Neurological:     General: No focal deficit present.     Mental Status: She is alert and oriented to person, place, and time.     Coordination: Romberg sign negative. Coordination abnormal. Finger-Nose-Finger Test normal.     Gait: Tandem walk abnormal.  Psychiatric:        Mood and Affect: Mood normal.        Behavior: Behavior normal.        Thought Content: Thought content normal.        Judgment: Judgment normal.      No results found for any visits on 02/11/24.

## 2024-02-12 ENCOUNTER — Ambulatory Visit: Payer: Self-pay | Admitting: Family Medicine

## 2024-02-18 DIAGNOSIS — G5133 Clonic hemifacial spasm, bilateral: Secondary | ICD-10-CM | POA: Diagnosis not present

## 2024-02-18 DIAGNOSIS — Z961 Presence of intraocular lens: Secondary | ICD-10-CM | POA: Diagnosis not present

## 2024-02-18 DIAGNOSIS — G245 Blepharospasm: Secondary | ICD-10-CM | POA: Diagnosis not present

## 2024-02-18 DIAGNOSIS — R519 Headache, unspecified: Secondary | ICD-10-CM | POA: Diagnosis not present

## 2024-02-18 DIAGNOSIS — H02423 Myogenic ptosis of bilateral eyelids: Secondary | ICD-10-CM | POA: Diagnosis not present

## 2024-02-18 DIAGNOSIS — G8929 Other chronic pain: Secondary | ICD-10-CM | POA: Diagnosis not present

## 2024-02-18 DIAGNOSIS — G5132 Clonic hemifacial spasm, left: Secondary | ICD-10-CM | POA: Diagnosis not present

## 2024-02-18 DIAGNOSIS — G5131 Clonic hemifacial spasm, right: Secondary | ICD-10-CM | POA: Diagnosis not present

## 2024-02-18 DIAGNOSIS — G244 Idiopathic orofacial dystonia: Secondary | ICD-10-CM | POA: Diagnosis not present

## 2024-02-20 ENCOUNTER — Telehealth: Payer: Self-pay

## 2024-02-20 NOTE — Telephone Encounter (Signed)
 Copied from CRM #[id number]. Topic: Referral - Status >> Feb 20, 2024 12:05 PM Myrick T wrote: Reason for CRM: Rollene from [facility] stated the referral was reviewed by Montie Bologna and it was advised that patient be referred to a dermatologist

## 2024-02-21 ENCOUNTER — Other Ambulatory Visit: Payer: Self-pay

## 2024-02-21 ENCOUNTER — Other Ambulatory Visit

## 2024-02-21 DIAGNOSIS — W57XXXA Bitten or stung by nonvenomous insect and other nonvenomous arthropods, initial encounter: Secondary | ICD-10-CM

## 2024-02-21 DIAGNOSIS — R944 Abnormal results of kidney function studies: Secondary | ICD-10-CM

## 2024-02-21 DIAGNOSIS — B49 Unspecified mycosis: Secondary | ICD-10-CM

## 2024-02-22 ENCOUNTER — Ambulatory Visit: Payer: Self-pay | Admitting: Family Medicine

## 2024-02-22 LAB — RENAL FUNCTION PANEL
Albumin: 4 g/dL (ref 3.6–5.1)
BUN: 17 mg/dL (ref 7–25)
CO2: 23 mmol/L (ref 20–32)
Calcium: 9.7 mg/dL (ref 8.6–10.4)
Chloride: 108 mmol/L (ref 98–110)
Creat: 0.91 mg/dL (ref 0.60–1.00)
Glucose, Bld: 104 mg/dL — ABNORMAL HIGH (ref 65–99)
Phosphorus: 3.3 mg/dL (ref 2.1–4.3)
Potassium: 4.8 mmol/L (ref 3.5–5.3)
Sodium: 139 mmol/L (ref 135–146)

## 2024-03-18 NOTE — Progress Notes (Addendum)
 "  NEUROLOGY CONSULTATION NOTE  Michelle Foster MRN: [id number] DOB: [birth year]  Referring provider: Refugio Nanny, MD Primary care provider: Butler Burr, MD  Reason for consult:  headache  Assessment/Plan:   Bilateral ptosis - may be aponeurotic.  It does not seem to fluctuate and did not improve with application of ice but since she endorses some recent dysphagia, would evaluate for possible myasthenia gravis.   Headache, may be migraine, stable now Word-finding difficulty.  Concern as she has a strong family history of Alzheimer's.  She was recently diagnosed with B12 deficiency which may be a factor.   Check Myasthenia gravis panel with MuSK antibodies Continue B12 supplementation.  Follow up in 6 months.  If no improvement, order neuropsychological evaluation Monitor headache  03/26/2023 ADDENDUM:  AchR antibody and MuSK antibody from 03/19/2024 are negative.  From my perspective, she is cleared for her blepharoplasty or related surgery.  Juliene Dunnings, DO  Subjective:  Michelle Foster is a 73 year old right-handed female with history of HTN, kidney stones, blepharospasm who presents for headache.  History supplemented by referring provider's note.  She is accompanied by her husband who supplements history.  Discussed the use of AI scribe software for clinical note transcription with the patient, who gave verbal consent to proceed.  History of Present Illness Michelle Foster is a 73 year old female who presents for neurological evaluation and clearance for eyelid surgery due to headaches and eyelid issues. She was referred by Dr. Homer for neurological clearance for eyelid surgery.  She has been experiencing headaches for over a year, which have worsened over time. The headaches originate in the left eye and radiate upwards to left side of head, sometimes waking her at night. They are described as pounding and are rated around 4 out of 10 in severity. No associated  nausea or increased sensitivity to sounds, but she is generally sensitive to light due to her eye condition. The headaches were occurring daily for months but have become occasional in the last two months. Relief is achieved with Tylenol  Extra Strength and Cymbalta Migraine.  She has blepharospasm which have been treated with Botox.  Her eyelids hang over her eyes, necessitating her to tilt her head back to see. This has been ongoing for several years, but notes it has gotten worse over the last 2 years.  It is causing constant pressure on her eyes. A field test showed improvement when her eyelids were taped up. She experiences occasional double vision during severe headaches.  More recently she notes some trouble swallowing.  No neck weakness.    She had a CT head and orbits performed on 10/30/2023 showed mild chronic small vessel ischemic changes in the cerebral white matter and global atrophy with ex vacuo dilatation.  There is concern about potential dementia due to a family history of Alzheimer's disease, including her mother, grandmother, and aunts. She has been experiencing word-finding difficulties, which have been present for several years but have become more noticeable recently. She describes instances where she knows the word but it comes out incorrectly.  She reports a history of balance issues, which improved after starting B12 supplementation in November due to B12 level of 350s.  She notes some improvement.      Current medications:  none Past medications:  gabapentin    PAST MEDICAL HISTORY: Past Medical History:  Diagnosis Date   Allergy    Anemia    Child   Arthritis    Blepharospasm of  both eyes    pt reports having to have Botox injections   Chronic kidney disease    kidney stones in 1985, tumor on uretha,    Colon polyps    COVID-19 10/19/2022   Estrogen deficiency    Eustachian tube dysfunction    History of kidney stones    Hyperlipidemia    no meds    Hypertension    Labile HTN   Osteoporosis    Pre-diabetes     PAST SURGICAL HISTORY: Past Surgical History:  Procedure Laterality Date   ABDOMINAL HYSTERECTOMY     TAH BSO by accident at 69    bilateral cataract surgery      BREAST BIOPSY Right 01/2021   COLONOSCOPY  2012   done in [place] , physician has since retired   COLONOSCOPY WITH PROPOFOL  N/A 11/13/2018   Procedure: COLONOSCOPY WITH PROPOFOL ;  Surgeon: Mansouraty, Aloha Raddle., MD;  Location: [facility];  Service: Gastroenterology;  Laterality: N/A;  NEEDS TO BE A 90 MIN SPOT   left knee meniscus tear      meniscal tear     right   OOPHORECTOMY     OVARY SURGERY     1982   POLYPECTOMY  11/13/2018   Procedure: POLYPECTOMY;  Surgeon: [name], MD;  Location: [facility];  Service: Gastroenterology;;   TOTAL KNEE ARTHROPLASTY Left 05/05/2022   Procedure: LEFT TOTAL KNEE ARTHROPLASTY;  Surgeon: [name], MD;  Location: [facility];  Service: Orthopedics;  Laterality: Left;   TOTAL KNEE REVISION Left 11/08/2022   Procedure: TOTAL KNEE REVISION;  Surgeon: [name], MD;  Location: [facility];  Service: Orthopedics;  Laterality: Left;   ureteral tumor removal      MEDICATIONS: Medications Ordered Prior to Encounter[1]  ALLERGIES: Allergies[2]  FAMILY HISTORY: Family History  Problem Relation Age of Onset   Breast cancer Mother 29   Heart disease Mother    Alzheimer's disease Mother    Arrhythmia Father    Heart failure Father    Heart disease Father    Atrial fibrillation Father    Stroke Father 1   Heart failure Brother    Brain cancer Brother    Stomach cancer Neg Hx    Colon cancer Neg Hx    Pancreatic cancer Neg Hx    Esophageal cancer Neg Hx     Objective:  Blood pressure 132/81, pulse 86, height 5' 4 (1.626 m), weight 154 lb (69.9 kg), SpO2 97%. General: No acute distress.  Patient appears well-groomed.   Head:  Normocephalic/atraumatic Eyes:  fundi examined but not  visualized Neck: supple, no paraspinal tenderness, full range of motion Heart: regular rate and rhythm Neurological Exam: Mental status: alert and oriented to person, place, and time, speech fluent and not dysarthric, language intact. Cranial nerves: CN I: not tested CN II: pupils equal, round and reactive to light, visual fields intact CN III, IV, VI:  full range of motion, no nystagmus, bilateral ptosis with negative ice pack test.  No fatigue. CN V: facial sensation intact. CN VII: upper and lower face symmetric CN VIII: hearing intact CN IX, X: gag intact, uvula midline CN XI: sternocleidomastoid and trapezius muscles intact CN XII: tongue midline Bulk & Tone: normal, no fasciculations. Motor:  muscle strength 5/5 throughout Sensation:  Pinprick and vibratory sensation intact. Deep Tendon Reflexes:  2+ throughout,  toes downgoing.   Finger to nose testing:  Without dysmetria.   Gait:  Normal station and stride.  Romberg negative.  Juliene Dunnings, DO  CC:  Butler Burr, MD  Refugio Nanny, MD        [1]  Current Outpatient Medications on File Prior to Visit  Medication Sig Dispense Refill   acetaminophen  (TYLENOL ) 500 MG tablet Take 500-1,000 mg by mouth every 6 (six) hours as needed (pain.). (Patient not taking: Reported on 02/11/2024)     cetirizine (ZYRTEC) 10 MG tablet Take 10 mg by mouth daily as needed for allergies. (Patient not taking: Reported on 02/11/2024)     gabapentin  (NEURONTIN ) 300 MG capsule Take 1 capsule (300 mg total) by mouth 2 (two) times daily. (Patient not taking: Reported on 02/11/2024) 60 capsule 1   ketoconazole  (NIZORAL ) 2 % shampoo APPLY TOPICALLY TWICE A WEEK (Patient not taking: Reported on 02/11/2024) 120 mL 1   Magnesium 300 MG CAPS Take 300 mg by mouth daily.     methocarbamol  (ROBAXIN ) 500 MG tablet Take 1 tablet (500 mg total) by mouth every 6 (six) hours as needed for muscle spasms. (Patient not taking: Reported on 02/11/2024) 40  tablet 0   ondansetron  (ZOFRAN ) 4 MG tablet Take 1 tablet (4 mg total) by mouth every 6 (six) hours as needed for nausea. (Patient not taking: Reported on 02/11/2024) 20 tablet 0   OVER THE COUNTER MEDICATION      oxyCODONE  (OXY IR/ROXICODONE ) 5 MG immediate release tablet Take 1-2 tablets (5-10 mg total) by mouth every 6 (six) hours as needed for severe pain. (Patient not taking: Reported on 02/11/2024) 42 tablet 0   predniSONE  (DELTASONE ) 20 MG tablet Take 3 tablets (60 mg total) by mouth daily with breakfast. (Patient not taking: Reported on 02/11/2024) 15 tablet 0   rosuvastatin  (CRESTOR ) 20 MG tablet TAKE 1 TABLET BY MOUTH EVERY DAY 90 tablet 1   terbinafine  (LAMISIL ) 250 MG tablet Take 1 tablet (250 mg total) by mouth daily. (Patient not taking: Reported on 02/11/2024) 14 tablet 0   traMADol  (ULTRAM ) 50 MG tablet Take 1-2 tablets (50-100 mg total) by mouth every 6 (six) hours as needed for moderate pain. (Patient not taking: Reported on 02/11/2024) 40 tablet 0   Current Facility-Administered Medications on File Prior to Visit  Medication Dose Route Frequency Provider Last Rate Last Admin   [START ON 07/12/2024] denosumab  (PROLIA ) injection 60 mg  60 mg Subcutaneous Q6 months Burr Butler DASEN, MD      [2]  Allergies Allergen Reactions   Meperidine Hcl Shortness Of Breath   Codeine Nausea And Vomiting   Oxycodone  Nausea Only   "

## 2024-03-19 ENCOUNTER — Other Ambulatory Visit

## 2024-03-19 ENCOUNTER — Encounter: Payer: Self-pay | Admitting: Neurology

## 2024-03-19 ENCOUNTER — Ambulatory Visit: Admitting: Neurology

## 2024-03-19 VITALS — BP 132/81 | HR 86 | Ht 64.0 in | Wt 154.0 lb

## 2024-03-19 DIAGNOSIS — R4789 Other speech disturbances: Secondary | ICD-10-CM

## 2024-03-19 DIAGNOSIS — H02403 Unspecified ptosis of bilateral eyelids: Secondary | ICD-10-CM

## 2024-03-19 NOTE — Patient Instructions (Addendum)
 Check myasthenia panel with MuSK. Your provider has requested that you have labwork completed today. Please go to [facility] ([id number]) on the second floor of this building before leaving the office today. You do not need to check in. If you are not called within 15 minutes please check with the front desk.   Continue B12 supplementation.  Follow up in 6 months.  If no improvement in memory, will pursue further testing

## 2024-03-24 ENCOUNTER — Telehealth: Payer: Self-pay | Admitting: Neurology

## 2024-03-24 NOTE — Telephone Encounter (Signed)
 Michelle Foster(pt) called in stating that she went to the labs on 03/19/24 and was marked as a No Show. She stated that none of the labs are showing up in Mychart, she can see that the labs were ordered on Mychart. She said blood was taken and everything. She stated that she needs that approval in order to schedule her surgery.   PH: [phone]

## 2024-03-25 NOTE — Telephone Encounter (Signed)
 Patient advised of Dr.Jaffe note. Labs are negative. From my standpoint, she is cleared for surgery. I added an addendum to my office note. We can print it off and fax to the requesting provider

## 2024-03-25 NOTE — Telephone Encounter (Signed)
 Spoke to the [employer] states she was the one who drew her labs and patient was there.

## 2024-03-29 LAB — MYASTHENIA GRAVIS PANEL 2
A CHR BINDING ABS: <0.3 nmol/L
ACHR Blocking Abs: <15 %{inhibition}
Acetylchol Modul Ab: <1 %{inhibition}

## 2024-03-29 LAB — MUSK ANTIBODIES

## 2024-04-07 ENCOUNTER — Emergency Department (HOSPITAL_COMMUNITY)

## 2024-04-07 ENCOUNTER — Observation Stay (HOSPITAL_COMMUNITY)
Admission: EM | Admit: 2024-04-07 | Discharge: 2024-04-08 | Disposition: A | Attending: Emergency Medicine | Admitting: Emergency Medicine

## 2024-04-07 ENCOUNTER — Other Ambulatory Visit: Payer: Self-pay

## 2024-04-07 ENCOUNTER — Observation Stay (HOSPITAL_COMMUNITY)

## 2024-04-07 ENCOUNTER — Encounter (HOSPITAL_COMMUNITY): Payer: Self-pay

## 2024-04-07 DIAGNOSIS — I129 Hypertensive chronic kidney disease with stage 1 through stage 4 chronic kidney disease, or unspecified chronic kidney disease: Secondary | ICD-10-CM | POA: Diagnosis not present

## 2024-04-07 DIAGNOSIS — R7303 Prediabetes: Secondary | ICD-10-CM | POA: Diagnosis not present

## 2024-04-07 DIAGNOSIS — N179 Acute kidney failure, unspecified: Secondary | ICD-10-CM | POA: Insufficient documentation

## 2024-04-07 DIAGNOSIS — G459 Transient cerebral ischemic attack, unspecified: Secondary | ICD-10-CM | POA: Diagnosis not present

## 2024-04-07 DIAGNOSIS — G458 Other transient cerebral ischemic attacks and related syndromes: Secondary | ICD-10-CM | POA: Diagnosis not present

## 2024-04-07 DIAGNOSIS — E785 Hyperlipidemia, unspecified: Secondary | ICD-10-CM | POA: Diagnosis not present

## 2024-04-07 DIAGNOSIS — E872 Acidosis, unspecified: Secondary | ICD-10-CM | POA: Diagnosis not present

## 2024-04-07 DIAGNOSIS — R29898 Other symptoms and signs involving the musculoskeletal system: Secondary | ICD-10-CM | POA: Insufficient documentation

## 2024-04-07 DIAGNOSIS — H53131 Sudden visual loss, right eye: Secondary | ICD-10-CM | POA: Diagnosis present

## 2024-04-07 DIAGNOSIS — R739 Hyperglycemia, unspecified: Secondary | ICD-10-CM | POA: Diagnosis not present

## 2024-04-07 DIAGNOSIS — N189 Chronic kidney disease, unspecified: Secondary | ICD-10-CM | POA: Diagnosis not present

## 2024-04-07 DIAGNOSIS — M810 Age-related osteoporosis without current pathological fracture: Secondary | ICD-10-CM | POA: Diagnosis not present

## 2024-04-07 LAB — CBC WITH DIFFERENTIAL/PLATELET
Abs Immature Granulocytes: 0.03 K/uL (ref 0.00–0.07)
Basophils Absolute: 0.1 K/uL (ref 0.0–0.1)
Basophils Relative: 1 %
Eosinophils Absolute: 0.3 K/uL (ref 0.0–0.5)
Eosinophils Relative: 3 %
HCT: 39 % (ref 36.0–46.0)
Hemoglobin: 12.6 g/dL (ref 12.0–15.0)
Immature Granulocytes: 0 %
Lymphocytes Relative: 26 %
Lymphs Abs: 2.5 K/uL (ref 0.7–4.0)
MCH: 31.9 pg (ref 26.0–34.0)
MCHC: 32.3 g/dL (ref 30.0–36.0)
MCV: 98.7 fL (ref 80.0–100.0)
Monocytes Absolute: 0.6 K/uL (ref 0.1–1.0)
Monocytes Relative: 7 %
Neutro Abs: 6.1 K/uL (ref 1.7–7.7)
Neutrophils Relative %: 63 %
Platelets: 320 K/uL (ref 150–400)
RBC: 3.95 MIL/uL (ref 3.87–5.11)
RDW: 12.7 % (ref 11.5–15.5)
WBC: 9.6 K/uL (ref 4.0–10.5)
nRBC: 0 % (ref 0.0–0.2)

## 2024-04-07 LAB — COMPREHENSIVE METABOLIC PANEL WITH GFR
ALT: 9 U/L (ref 0–44)
AST: 24 U/L (ref 15–41)
Albumin: 4.2 g/dL (ref 3.5–5.0)
Alkaline Phosphatase: 70 U/L (ref 38–126)
Anion gap: 14 (ref 5–15)
BUN: 15 mg/dL (ref 8–23)
CO2: 20 mmol/L — ABNORMAL LOW (ref 22–32)
Calcium: 9.8 mg/dL (ref 8.9–10.3)
Chloride: 105 mmol/L (ref 98–111)
Creatinine, Ser: 1.18 mg/dL — ABNORMAL HIGH (ref 0.44–1.00)
GFR, Estimated: 49 mL/min — ABNORMAL LOW
Glucose, Bld: 133 mg/dL — ABNORMAL HIGH (ref 70–99)
Potassium: 4.3 mmol/L (ref 3.5–5.1)
Sodium: 139 mmol/L (ref 135–145)
Total Bilirubin: 0.3 mg/dL (ref 0.0–1.2)
Total Protein: 7.8 g/dL (ref 6.5–8.1)

## 2024-04-07 LAB — PROTIME-INR
INR: 0.9 (ref 0.8–1.2)
Prothrombin Time: 13.2 s (ref 11.4–15.2)

## 2024-04-07 LAB — APTT: aPTT: 29 s (ref 24–36)

## 2024-04-07 LAB — ETHANOL: Alcohol, Ethyl (B): <15 mg/dL

## 2024-04-07 LAB — CBG MONITORING, ED: Glucose-Capillary: 155 mg/dL — ABNORMAL HIGH (ref 70–99)

## 2024-04-07 MED ORDER — ASPIRIN 81 MG PO TBEC: 81.0000 mg | DELAYED_RELEASE_TABLET | Freq: Every day | ORAL | Status: DC

## 2024-04-07 MED ORDER — IOHEXOL 350 MG/ML SOLN
75.0000 mL | Freq: Once | INTRAVENOUS | Status: AC | PRN
  Administered 2024-04-07: 75 mL via INTRAVENOUS

## 2024-04-07 MED ORDER — PROCHLORPERAZINE EDISYLATE 10 MG/2ML IJ SOLN
5.0000 mg | Freq: Four times a day (QID) | INTRAMUSCULAR | Status: DC | PRN
  Administered 2024-04-08: 5 mg via INTRAVENOUS
  Filled 2024-04-07: qty 2

## 2024-04-07 MED ORDER — STROKE: EARLY STAGES OF RECOVERY BOOK
Freq: Once | Status: AC
  Filled 2024-04-07: qty 1

## 2024-04-07 MED ORDER — ENOXAPARIN SODIUM 40 MG/0.4ML IJ SOSY
40.0000 mg | PREFILLED_SYRINGE | INTRAMUSCULAR | Status: DC
  Administered 2024-04-08: 40 mg via SUBCUTANEOUS
  Filled 2024-04-07: qty 0.4

## 2024-04-07 MED ORDER — POLYETHYLENE GLYCOL 3350 17 G PO PACK: 17.0000 g | PACK | Freq: Every day | ORAL | Status: DC | PRN

## 2024-04-07 MED ORDER — LACTATED RINGERS IV SOLN: INTRAVENOUS | Status: DC

## 2024-04-07 MED ORDER — ROSUVASTATIN CALCIUM 20 MG PO TABS
20.0000 mg | ORAL_TABLET | Freq: Every day | ORAL | Status: DC
  Administered 2024-04-07: 20 mg via ORAL
  Filled 2024-04-07 (×2): qty 1

## 2024-04-07 MED ORDER — ASPIRIN 325 MG PO TABS: 325.0000 mg | ORAL_TABLET | Freq: Once | ORAL | Status: DC

## 2024-04-07 MED ORDER — ASPIRIN 81 MG PO TBEC
81.0000 mg | DELAYED_RELEASE_TABLET | Freq: Every day | ORAL | Status: DC
  Administered 2024-04-08: 81 mg via ORAL
  Filled 2024-04-07: qty 1

## 2024-04-07 MED ORDER — MELATONIN 3 MG PO TABS: 6.0000 mg | ORAL_TABLET | Freq: Every evening | ORAL | Status: DC | PRN

## 2024-04-07 MED ORDER — ACETAMINOPHEN 325 MG PO TABS: 650.0000 mg | ORAL_TABLET | Freq: Four times a day (QID) | ORAL | Status: DC | PRN

## 2024-04-07 NOTE — H&P (Signed)
 " History and Physical  Michelle Foster FMW:[id number] DOB: 22-Jan-1951 DOA: 04/07/2024  Referring physician: Dr. Dean, EDP  PCP: [name] DASEN, MD  Outpatient Specialists: None Patient coming from: Home  Chief Complaint: R eye vision loss   HPI: Michelle Foster is a 74 y.o. female with medical history significant for HLD who presents to the ER due to sudden onset right vision loss.  She was in the kitchen when she had complete loss of vision.  Lasted about 5 minutes.  Associated with dizziness.  The patient took a full dose of aspirin .  She presented to the ER for further evaluation.  In the ER, symptoms are completely resolved, in sinus rhythm.  MRI without contrast is non acute.  EDP discussed the case with neurology who recommended admission for TIA workup.  Admitted by [facility], Hospitalist service.  ED Course: Temp 97.7.  BP 127/81, HR 75, RR 22, O2 sat 98% on RA.  Review of Systems: Review of systems as noted in the HPI. All other systems reviewed and are negative.   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,    Colon polyps    COVID-19 10/19/2022   Estrogen deficiency    Eustachian tube dysfunction    History of kidney stones    Hyperlipidemia    no meds   Hypertension    Labile HTN   Osteoporosis    Pre-diabetes    Past Surgical History:  Procedure Laterality Date   ABDOMINAL HYSTERECTOMY     TAH BSO by accident at 28    bilateral cataract surgery      BREAST BIOPSY Right 01/2021   COLONOSCOPY  2012   done in [place] , physician has since retired   COLONOSCOPY WITH PROPOFOL  N/A 11/13/2018   Procedure: COLONOSCOPY WITH PROPOFOL ;  Surgeon: [name], MD;  Location: [facility];  Service: Gastroenterology;  Laterality: N/A;  NEEDS TO BE A 90 MIN SPOT   left knee  meniscus tear      meniscal tear     right   OOPHORECTOMY     OVARY SURGERY     1982   POLYPECTOMY  11/13/2018   Procedure: POLYPECTOMY;  Surgeon: [name], MD;  Location: [facility];  Service: Gastroenterology;;   TOTAL KNEE ARTHROPLASTY Left 05/05/2022   Procedure: LEFT TOTAL KNEE ARTHROPLASTY;  Surgeon: [name], MD;  Location: [facility];  Service: Orthopedics;  Laterality: Left;   TOTAL KNEE REVISION Left 11/08/2022   Procedure: TOTAL KNEE REVISION;  Surgeon: [name], MD;  Location: [facility];  Service: Orthopedics;  Laterality: Left;   ureteral tumor removal      Social History:  reports that she has never smoked. She has never used smokeless tobacco. She reports current alcohol use of about 1.0 standard drink of alcohol per week. She reports that she does not use drugs.   Allergies[1]  Family History  Problem Relation Age of Onset   Breast cancer Mother 93   Heart disease Mother    Alzheimer's disease Mother    Seizures Father    Arrhythmia Father    Heart failure Father    Heart disease Father    Atrial fibrillation Father    Stroke Father 35  Heart failure Brother    Brain cancer Brother    Alzheimer's disease Maternal Aunt    Alzheimer's disease Maternal Aunt    Alzheimer's disease Maternal Aunt    Alzheimer's disease Paternal Aunt    Alzheimer's disease Maternal Grandmother    Stomach cancer Neg Hx    Colon cancer Neg Hx    Pancreatic cancer Neg Hx    Esophageal cancer Neg Hx       Prior to Admission medications  Medication Sig Start Date End Date Taking? Authorizing Provider  rosuvastatin  (CRESTOR ) 20 MG tablet TAKE 1 TABLET BY MOUTH EVERY DAY 01/16/22   [name] DASEN, MD    Physical Exam: BP 125/78   Pulse 83   Temp 97.7 F (36.5 C) (Oral)   Resp (!) 25   Ht 5' 4 (1.626 m)   Wt 69.9 kg   SpO2 96%   BMI 26.43 kg/m   General: 74 y.o. year-old female well developed well nourished in no acute distress.  Alert  and oriented x3. Cardiovascular: Regular rate and rhythm with no rubs or gallops.  No thyromegaly or JVD noted.  No lower extremity edema. 2/4 pulses in all 4 extremities. Respiratory: Clear to auscultation with no wheezes or rales. Good inspiratory effort. Abdomen: Soft nontender nondistended with normal bowel sounds x4 quadrants. Muskuloskeletal: No cyanosis, clubbing or edema noted bilaterally Neuro: CN II-XII intact, strength, sensation, reflexes Skin: No ulcerative lesions noted or rashes Psychiatry: Judgement and insight appear normal. Mood is appropriate for condition and setting          Labs on Admission:  Basic Metabolic Panel: Recent Labs  Lab 04/07/24 1822  NA 139  K 4.3  CL 105  CO2 20*  GLUCOSE 133*  BUN 15  CREATININE 1.18*  CALCIUM  9.8   Liver Function Tests: Recent Labs  Lab 04/07/24 1822  AST 24  ALT 9  ALKPHOS 70  BILITOT 0.3  PROT 7.8  ALBUMIN 4.2   No results for input(s): LIPASE, AMYLASE in the last 168 hours. No results for input(s): AMMONIA in the last 168 hours. CBC: Recent Labs  Lab 04/07/24 1858  WBC 9.6  NEUTROABS 6.1  HGB 12.6  HCT 39.0  MCV 98.7  PLT 320   Cardiac Enzymes: No results for input(s): CKTOTAL, CKMB, CKMBINDEX, TROPONINI in the last 168 hours.  BNP (last 3 results) No results for input(s): BNP in the last 8760 hours.  ProBNP (last 3 results) No results for input(s): PROBNP in the last 8760 hours.  CBG: Recent Labs  Lab 04/07/24 1817  GLUCAP 155*    Radiological Exams on Admission: MR BRAIN WO CONTRAST Result Date: 04/07/2024 EXAM: MRI BRAIN WITHOUT CONTRAST 04/07/2024 06:45:16 PM TECHNIQUE: Multiplanar multisequence MRI of the head/brain was performed without the administration of intravenous contrast. COMPARISON: None available. CLINICAL HISTORY: Neuro deficit, acute, stroke suspected. FINDINGS: BRAIN AND VENTRICLES: No acute infarct. No intracranial hemorrhage. No mass. No midline shift.  No hydrocephalus. There is a prominent perivascular space in the inferior aspect of the left basal ganglia. There is minimal supratentorial white matter signal abnormality likely reflecting chronic microvascular ischemic changes. The cerebral volume is within normal limits for the patient's age. The sella is unremarkable. Normal flow voids. ORBITS: Bilateral lens replacement. SINUSES AND MASTOIDS: No significant abnormality. BONES AND SOFT TISSUES: Normal marrow signal. No soft tissue abnormality. IMPRESSION: 1. No acute findings. Electronically signed by: Donnice Mania MD 04/07/2024 07:11 PM EST RP Workstation: [id number]    EKG: I independently  viewed the EKG done and my findings are as followed: SR 82, QTC 625.    Assessment/Plan Present on Admission:  TIA (transient ischemic attack)  Principal Problem:   TIA (transient ischemic attack)  TIA MRI without contrast is non acute Follow TTE, lipid panel, HgA1C Aspirin  81 mg daily at home Crestor   AKI Creatinine at baseline 0.8 Presented with creatinine 1.1 with GFR of 49 Gentle IV fluid hydration LR 75 cc/h x 1 day Avoid for toxic agents, dehydration, hypotension.  Non anion gap metabolic acidosis in the setting of acute renal insufficiency Serum bicarb 20, anion gap 14 Continue LR IV fluid hydration Repeat BMP in the morning.  Hyperglycemia Serum glucose 133 Follow hemoglobin A1c Goal hemoglobin A1c less than 7.0.  Hyperlipidemia Resume home Crestor  Follow LDL Goal LDL less than 70   Time: 75 minutes.   DVT prophylaxis: Subcu Lovenox  daily.  Code Status: Full code.  Family Communication: None at bedside.  Disposition Plan: Admitted to telemetry unit.  Consults called: Neurology consulted by EDP.  Admission status: Observation status.   Status is: Observation    Terry LOISE Hurst MD [facility] Pager [phone]  If 7PM-7AM, please contact night-coverage [email or web address] Password [email or web address]  04/07/2024, 8:04  PM      [1]  Allergies Allergen Reactions   Meperidine Hcl Shortness Of Breath   Codeine Nausea And Vomiting   Oxycodone  Nausea Only   "

## 2024-04-07 NOTE — ED Notes (Signed)
Patient transported to CT and MRI. 

## 2024-04-07 NOTE — ED Triage Notes (Signed)
 Pt arrived via POV c/o right eye blindness that sporadically occurred this afternoon; which has now resolved PTA. Pt reports concern for possible mini-stroke. Pt denies slurred speech, denies headache, denies weakness or numbness.

## 2024-04-07 NOTE — Plan of Care (Signed)
" °  Problem: Education: Goal: Knowledge of General Education information will improve Description: Including pain rating scale, medication(s)/side effects and non-pharmacologic comfort measures Outcome: Progressing   Problem: Clinical Measurements: Goal: Ability to maintain clinical measurements within normal limits will improve Outcome: Progressing   Problem: Activity: Goal: Risk for activity intolerance will decrease Outcome: Progressing   Problem: Education: Goal: Knowledge of disease or condition will improve Outcome: Progressing   Problem: Self-Care: Goal: Ability to participate in self-care as condition permits will improve Outcome: Progressing   Problem: Nutrition: Goal: Risk of aspiration will decrease Outcome: Progressing   "

## 2024-04-07 NOTE — ED Provider Notes (Signed)
 " [place] EMERGENCY DEPARTMENT AT [facility] Provider Note   CSN: [id number] Arrival date & time: 04/07/24  1753     Patient presents with: Blindness   Michelle Foster is a 74 y.o. female.   Pt is a 74 yo female with pmhx significant for hld, osteoporosis, kidney stones, htn, and arthritis.  Pt said she was in the kitchen this afternoon and her vision to her right eye became suddenly completely black.  She said it resolved after 5 minutes.  Her husband said she was a little dizzy, but she denies any other neuro sx.  She is completely back to normal now.  She's never had this happen in the past.  Pt did take ASA 325 mg pta.       Prior to Admission medications  Medication Sig Start Date End Date Taking? Authorizing Provider  rosuvastatin  (CRESTOR ) 20 MG tablet TAKE 1 TABLET BY MOUTH EVERY DAY 01/16/22   [name] DASEN, MD    Allergies: Meperidine hcl, Codeine, and Oxycodone     Review of Systems  Eyes:        Right eye blindness  All other systems reviewed and are negative.   Updated Vital Signs BP 125/78   Pulse 83   Temp 97.7 F (36.5 C) (Oral)   Resp (!) 25   Ht 5' 4 (1.626 m)   Wt 69.9 kg   SpO2 96%   BMI 26.43 kg/m   Physical Exam Vitals and nursing note reviewed.  Constitutional:      Appearance: Normal appearance.  HENT:     Head: Normocephalic and atraumatic.     Right Ear: External ear normal.     Left Ear: External ear normal.     Nose: Nose normal.     Mouth/Throat:     Mouth: Mucous membranes are moist.     Pharynx: Oropharynx is clear.  Eyes:     Extraocular Movements: Extraocular movements intact.     Conjunctiva/sclera: Conjunctivae normal.     Pupils: Pupils are equal, round, and reactive to light.  Cardiovascular:     Rate and Rhythm: Normal rate and regular rhythm.     Pulses: Normal pulses.     Heart sounds: Normal heart sounds.  Pulmonary:     Effort: Pulmonary effort is normal.     Breath sounds: Normal breath  sounds.  Abdominal:     General: Abdomen is flat. Bowel sounds are normal.     Palpations: Abdomen is soft.  Musculoskeletal:        General: Normal range of motion.     Cervical back: Normal range of motion and neck supple.  Skin:    General: Skin is warm.     Capillary Refill: Capillary refill takes less than 2 seconds.  Neurological:     General: No focal deficit present.     Mental Status: She is alert and oriented to person, place, and time.  Psychiatric:        Mood and Affect: Mood normal.        Behavior: Behavior normal.     (all labs ordered are listed, but only abnormal results are displayed) Labs Reviewed  COMPREHENSIVE METABOLIC PANEL WITH GFR - Abnormal; Notable for the following components:      Result Value   CO2 20 (*)    Glucose, Bld 133 (*)    Creatinine, Ser 1.18 (*)    GFR, Estimated 49 (*)    All other components within normal  limits  CBG MONITORING, ED - Abnormal; Notable for the following components:   Glucose-Capillary 155 (*)    All other components within normal limits  ETHANOL  CBC WITH DIFFERENTIAL/PLATELET  PROTIME-INR  APTT  URINE DRUG SCREEN    EKG: EKG Interpretation Date/Time:  Monday April 07 2024 18:21:05 EST Ventricular Rate:  82 PR Interval:  143 QRS Duration:  66 QT Interval:  535 QTC Calculation: 625 R Axis:   20  Text Interpretation: Sinus rhythm Borderline T abnormalities, anterior leads Prolonged QT interval qt prolonged since last EKG Confirmed by Dean Clarity [phone]) on 04/07/2024 6:25:38 PM  Radiology: MR BRAIN WO CONTRAST Result Date: 04/07/2024 EXAM: MRI BRAIN WITHOUT CONTRAST 04/07/2024 06:45:16 PM TECHNIQUE: Multiplanar multisequence MRI of the head/brain was performed without the administration of intravenous contrast. COMPARISON: None available. CLINICAL HISTORY: Neuro deficit, acute, stroke suspected. FINDINGS: BRAIN AND VENTRICLES: No acute infarct. No intracranial hemorrhage. No mass. No midline shift. No  hydrocephalus. There is a prominent perivascular space in the inferior aspect of the left basal ganglia. There is minimal supratentorial white matter signal abnormality likely reflecting chronic microvascular ischemic changes. The cerebral volume is within normal limits for the patient's age. The sella is unremarkable. Normal flow voids. ORBITS: Bilateral lens replacement. SINUSES AND MASTOIDS: No significant abnormality. BONES AND SOFT TISSUES: Normal marrow signal. No soft tissue abnormality. IMPRESSION: 1. No acute findings. Electronically signed by: Donnice Mania MD 04/07/2024 07:11 PM EST RP Workstation: [id number]     Procedures   Medications Ordered in the ED - No data to display                                  Medical Decision Making Amount and/or Complexity of Data Reviewed Labs: ordered. Radiology: ordered.  Risk Decision regarding hospitalization.   This patient presents to the ED for concern of right eye blindness, this involves an extensive number of treatment options, and is a complaint that carries with it a high risk of complications and morbidity.  The differential diagnosis includes CVA, TIA, eye problem   Co morbidities that complicate the patient evaluation   hld, osteoporosis, kidney stones, htn, and arthritis   Additional history obtained:  Additional history obtained from epic chart review External records from outside source obtained and reviewed including husband   Lab Tests:  I Ordered, and personally interpreted labs.  The pertinent results include:  cmp with cr sl elevated at 1.18 (0.91 on 12/4, but 1.16 on 11/24); etoh neg; cbc nl   Imaging Studies ordered:  I ordered imaging studies including mri brain  I independently visualized and interpreted imaging which showed No acute findings.  I agree with the radiologist interpretation   Cardiac Monitoring:  The patient was maintained on a cardiac monitor.  I personally viewed and interpreted the  cardiac monitored which showed an underlying rhythm of: nsr   Medicines ordered and prescription drug management:   I have reviewed the patients home medicines and have made adjustments as needed   Test Considered:  mri   Critical Interventions:  mri   Consultations Obtained:  I requested consultation with the neurologist (Dr. Germaine),  and discussed lab and imaging findings as well as pertinent plan -she said pt could stay here for TIA work up. Pt d/w Dr. Shona (triad) for admission.   Problem List / ED Course:  TIA:  sx completely resolved.  She took ASA pta.  MRI  neg.  Pt will need admission for TIA work up.   Reevaluation:  After the interventions noted above, I reevaluated the patient and found that they have :improved   Social Determinants of Health:  Lives at home   Dispostion:  After consideration of the diagnostic results and the patients response to treatment, I feel that the patent would benefit from admission.       Final diagnoses:  TIA (transient ischemic attack)    ED Discharge Orders     None          Dean Clarity, MD 04/07/24 1954  "

## 2024-04-08 ENCOUNTER — Telehealth: Payer: Self-pay | Admitting: *Deleted

## 2024-04-08 ENCOUNTER — Observation Stay (HOSPITAL_COMMUNITY)

## 2024-04-08 DIAGNOSIS — G459 Transient cerebral ischemic attack, unspecified: Secondary | ICD-10-CM

## 2024-04-08 DIAGNOSIS — G453 Amaurosis fugax: Secondary | ICD-10-CM

## 2024-04-08 LAB — ECHOCARDIOGRAM COMPLETE
AR max vel: 2.31 cm2
AV Area VTI: 2.38 cm2
AV Area mean vel: 1.92 cm2
AV Mean grad: 3.5 mmHg
AV Peak grad: 6.4 mmHg
Ao pk vel: 1.27 m/s
Area-P 1/2: 3.48 cm2
Height: 64 in
S' Lateral: 2.1 cm
Weight: 2430.35 [oz_av]

## 2024-04-08 LAB — BASIC METABOLIC PANEL WITH GFR
Anion gap: 11 (ref 5–15)
BUN: 12 mg/dL (ref 8–23)
CO2: 21 mmol/L — ABNORMAL LOW (ref 22–32)
Calcium: 9.1 mg/dL (ref 8.9–10.3)
Chloride: 107 mmol/L (ref 98–111)
Creatinine, Ser: 1.01 mg/dL — ABNORMAL HIGH (ref 0.44–1.00)
GFR, Estimated: 58 mL/min — ABNORMAL LOW
Glucose, Bld: 100 mg/dL — ABNORMAL HIGH (ref 70–99)
Potassium: 4.5 mmol/L (ref 3.5–5.1)
Sodium: 140 mmol/L (ref 135–145)

## 2024-04-08 LAB — LIPID PANEL
Cholesterol: 116 mg/dL (ref 0–200)
HDL: 27 mg/dL — ABNORMAL LOW
LDL Cholesterol: 58 mg/dL (ref 0–99)
Total CHOL/HDL Ratio: 4.3 ratio
Triglycerides: 156 mg/dL — ABNORMAL HIGH
VLDL: 31 mg/dL (ref 0–40)

## 2024-04-08 LAB — HEMOGLOBIN A1C
Hgb A1c MFr Bld: 6.1 % — ABNORMAL HIGH (ref 4.8–5.6)
Mean Plasma Glucose: 128.37 mg/dL

## 2024-04-08 MED ORDER — ASPIRIN 81 MG PO TBEC: 81.0000 mg | DELAYED_RELEASE_TABLET | Freq: Every day | ORAL | 12 refills | Status: AC

## 2024-04-08 MED ORDER — PRAVASTATIN SODIUM 10 MG PO TABS
20.0000 mg | ORAL_TABLET | Freq: Every day | ORAL | Status: DC
  Administered 2024-04-08: 20 mg via ORAL
  Filled 2024-04-08: qty 2

## 2024-04-08 NOTE — Telephone Encounter (Signed)
-----   Message from [place], DO sent at 04/08/2024  4:52 PM EST ----- Pt needs 30 day cardiac monitor for TIA. Thank you.

## 2024-04-08 NOTE — Evaluation (Signed)
 Occupational Therapy Evaluation Patient Details [name] MRN: [id number] DOB: [birth year] Today's Date: 04/08/2024   History of Present Illness   Michelle Foster is a 74 y.o. female with medical history significant for HLD who presents to the ER due to sudden onset right vision loss.  She was in the kitchen when she had complete loss of vision of the right eye.  No prior history of the same.  Lasted about 5 minutes.  Associated with dizziness.  The patient took a full dose of aspirin .  She presented to the ER for further evaluation. (per DO)     Clinical Impressions Pt agreeable to OT and PT co-evaluation. Pt appears to be at or near baseline function for mobility and ADL's. R shoulder weak but this is reportedly a baseline condition that the pt plans to get addressed vis surgery. Pt able to functionally ambulate without AD and demonstrates no difficulty with ADL's. Pt left in the bed with call bell within reach and family present. Pt is not recommended for any further acute OT services and will be discharged to care of nursing staff for remaining length of stay.                Functional Status Assessment   Patient has not had a recent decline in their functional status     Equipment Recommendations   None recommended by OT              Precautions/Restrictions   Precautions Precautions: None Restrictions Weight Bearing Restrictions Per Provider Order: No     Mobility Bed Mobility Overal bed mobility: Independent                  Transfers Overall transfer level: Independent                        Balance Overall balance assessment: Independent                                         ADL either performed or assessed with clinical judgement   ADL Overall ADL's : Independent                                             Vision Baseline Vision/History: 1 Wears glasses Ability to See in  Adequate Light: 0 Adequate Patient Visual Report: Other (comment) (Pt reports that she lost vision in her R eye briefly but that it returned within a few minutes. Vision has returned to normal now.) Vision Assessment?: No apparent visual deficits;Wears glasses for reading     Perception Perception: Not tested       Praxis Praxis: Not tested       Pertinent Vitals/Pain Pain Assessment Pain Assessment: No/denies pain     Extremity/Trunk Assessment Upper Extremity Assessment Upper Extremity Assessment: RUE deficits/detail;Overall [facility] for tasks assessed RUE Deficits / Details: 4-/5 shoulder flexion and abduction; WFL otherwise. Pt reports baseine shoulder injury that she has been getting therapy for and that she plans to have surgery for soon.   Lower Extremity Assessment Lower Extremity Assessment: Defer to PT evaluation   Cervical / Trunk Assessment Cervical / Trunk Assessment: Normal   Communication Communication Communication: No apparent difficulties   Cognition Arousal: Alert  Behavior During Therapy: WFL for tasks assessed/performed Cognition: No apparent impairments                               Following commands: Intact       Cueing  General Comments   Cueing Techniques: Verbal cues                 Home Living Family/patient expects to be discharged to:: Private residence Living Arrangements: Spouse/significant other;Other relatives Available Help at Discharge: Family;Available 24 hours/day Type of Home: House Home Access: Stairs to enter [employer] of Steps: 1 Entrance Stairs-Rails: None Home Layout: One level     Bathroom Shower/Tub: Tub/shower unit;Walk-in shower   Bathroom Toilet: Handicapped height Bathroom Accessibility: Yes How Accessible: Accessible via wheelchair;Accessible via walker Home Equipment: Rolling Walker (2 wheels);Cane - single point;BSC/3in1;Wheelchair - manual;Grab bars - tub/shower           Prior Functioning/Environment Prior Level of Function : Independent/Modified Independent;Driving             Mobility Comments: Tourist information centre manager without AD. ADLs Comments: Independent                            Co-evaluation PT/OT/SLP Co-Evaluation/Treatment: Yes Reason for Co-Treatment: To address functional/ADL transfers   OT goals addressed during session: ADL's and self-care      AM-PAC OT 6 Clicks Daily Activity     Outcome Measure Help from another person eating meals?: None Help from another person taking care of personal grooming?: None Help from another person toileting, which includes using toliet, bedpan, or urinal?: None Help from another person bathing (including washing, rinsing, drying)?: None Help from another person to put on and taking off regular upper body clothing?: None Help from another person to put on and taking off regular lower body clothing?: None 6 Click Score: 24   End of Session    Activity Tolerance: Patient tolerated treatment well Patient left: in bed;with call bell/phone within reach;with family/visitor present  OT Visit Diagnosis: Other symptoms and signs involving the nervous system (M70.101)                Time: 8987-8975 OT Time Calculation (min): 12 min Charges:  OT General Charges $OT Visit: 1 Visit OT Evaluation $OT Eval Low Complexity: 1 Low  Shayaan Parke OT, MOT   Jayson Person 04/08/2024, 10:34 AM

## 2024-04-08 NOTE — Progress Notes (Signed)
 Echocardiogram Attempted exam at 840am. Physician in the room requested exam be held off as it may not be needed anymore.  Michelle Foster 04/08/2024, 8:42 AM

## 2024-04-08 NOTE — Progress Notes (Signed)
*  PRELIMINARY RESULTS* Echocardiogram 2D Echocardiogram has been performed.  Teresa Aida PARAS 04/08/2024, 5:28 PM

## 2024-04-08 NOTE — Progress Notes (Signed)
" °  Transition of Care [facility]) Screening Note   Patient Details  [name] Date of Birth: July 14, 1950   Transition of [facility]) CM/SW Contact:    Hoy DELENA Bigness, LCSW Phone Number: 04/08/2024, 10:54 AM    Transition of Care Department [facility]) has reviewed patient and no TOC needs have been identified at this time. We will continue to monitor patient advancement through interdisciplinary progression rounds. If new patient transition needs arise, please place a TOC consult.    04/08/24 1054  TOC Brief Assessment  Insurance and Status Reviewed  Patient has primary care physician Yes  Home environment has been reviewed From home w/ spouse  Prior level of function: Independent  Prior/Current Home Services No current home services  Social Drivers of Health Review SDOH reviewed no interventions necessary  Readmission risk has been reviewed Yes  Transition of care needs no transition of care needs at this time    "

## 2024-04-08 NOTE — Telephone Encounter (Signed)
Order placed and pt enrolled in [employer].

## 2024-04-08 NOTE — Evaluation (Signed)
 Physical Therapy Evaluation Patient Details [name] MRN: [id number] DOB: 12-Apr-1950 Today's Date: 04/08/2024  History of Present Illness  Michelle Foster is a 74 y.o. female with medical history significant for HLD who presents to the ER due to sudden onset right vision loss.  She was in the kitchen when she had complete loss of vision of the right eye.  No prior history of the same.  Lasted about 5 minutes.  Associated with dizziness.  The patient took a full dose of aspirin .  She presented to the ER for further evaluation.   Clinical Impression  Patient was agreeable to PT/OT co-evaluation. Patient's husband present throughout session. They report at baseline, pt is independent with community ambulation and ADL/iADLs.  This date, pt remains independent with bed mobility, transfers, and ambulation. Pt does demonstrate some residual weakness and sensation deficits from previous L TKA and ROM and strength deficits in RUE, in which she is currently receiving therapy services. Patient reports all visual deficits have resolved at this time. Patient does not present with urgent need for skilled physical therapy acutely at this time. Would recommend pt return to therapy services for R shoulder. Patient discharged to care of nursing for ambulation daily as tolerated for length of stay.        If plan is discharge home, recommend the following:     Can travel by private vehicle        Equipment Recommendations None recommended by PT  Recommendations for Other Services       Functional Status Assessment Patient has had a recent decline in their functional status and demonstrates the ability to make significant improvements in function in a reasonable and predictable amount of time.     Precautions / Restrictions Precautions Precautions: None Recall of Precautions/Restrictions: Intact Restrictions Weight Bearing Restrictions Per Provider Order: No      Mobility  Bed  Mobility Overal bed mobility: Independent        Transfers Overall transfer level: Independent        Ambulation/Gait Ambulation/Gait assistance: Independent Gait Distance (Feet): 250 Feet Assistive device: None Gait Pattern/deviations: WFL(Within Functional Limits) Gait velocity: WNL        Stairs            Wheelchair Mobility     Tilt Bed    Modified Rankin (Stroke Patients Only)       Balance Overall balance assessment: Independent             Pertinent Vitals/Pain Pain Assessment Pain Assessment: No/denies pain    Home Living Family/patient expects to be discharged to:: Private residence Living Arrangements: Spouse/significant other;Other relatives Available Help at Discharge: Family;Available 24 hours/day Type of Home: House Home Access: Stairs to enter Entrance Stairs-Rails: None Entrance Stairs-Number of Steps: 1   Home Layout: One level Home Equipment: Agricultural Consultant (2 wheels);Cane - single point;BSC/3in1;Wheelchair - manual;Grab bars - tub/shower      Prior Function Prior Level of Function : Independent/Modified Independent;Driving             Mobility Comments: Community ambulator without AD, no falls, drives ADLs Comments: Independent     Extremity/Trunk Assessment   Upper Extremity Assessment Upper Extremity Assessment: Defer to OT evaluation RUE Deficits / Details: 4-/5 shoulder flexion and abduction; WFL otherwise. Pt reports baseine shoulder injury that she has been getting therapy for and that she plans to have surgery for soon.    Lower Extremity Assessment Lower Extremity Assessment: LLE deficits/detail (RLE WFL,  coordination WFL bilaterally) LLE Deficits / Details: pt with 2 previous L TKA with residual weakness (grossly 4-/5) and sensation deficits since surgery, more than 1 year ago. LLE Sensation: decreased light touch LLE Coordination: decreased gross motor    Cervical / Trunk Assessment Cervical /  Trunk Assessment: Normal  Communication   Communication Communication: No apparent difficulties    Cognition Arousal: Alert Behavior During Therapy: WFL for tasks assessed/performed   PT - Cognitive impairments: No apparent impairments     Following commands: Intact       Cueing Cueing Techniques: Verbal cues, Visual cues     General Comments      Exercises     Assessment/Plan    PT Assessment All further PT needs can be met in the next venue of care  PT Problem List Decreased range of motion;Decreased strength       PT Treatment Interventions      PT Goals (Current goals can be found in the Care Plan section)  Acute Rehab PT Goals Patient Stated Goal: Return home PT Goal Formulation: With patient Time For Goal Achievement: 04/09/24 Potential to Achieve Goals: Good    Frequency       Co-evaluation PT/OT/SLP Co-Evaluation/Treatment: Yes Reason for Co-Treatment: To address functional/ADL transfers PT goals addressed during session: Mobility/safety with mobility OT goals addressed during session: ADL's and self-care       AM-PAC PT 6 Clicks Mobility  Outcome Measure Help needed turning from your back to your side while in a flat bed without using bedrails?: None Help needed moving from lying on your back to sitting on the side of a flat bed without using bedrails?: None Help needed moving to and from a bed to a chair (including a wheelchair)?: None Help needed standing up from a chair using your arms (e.g., wheelchair or bedside chair)?: None Help needed to walk in hospital room?: None Help needed climbing 3-5 steps with a railing? : None 6 Click Score: 24    End of Session   Activity Tolerance: Patient tolerated treatment well Patient left: in bed;with family/visitor present;with call bell/phone within reach Nurse Communication: Mobility status PT Visit Diagnosis: Other symptoms and signs involving the nervous system (R29.898)    Time:  8986-8975 PT Time Calculation (min) (ACUTE ONLY): 11 min   Charges:   PT Evaluation $PT Eval Low Complexity: 1 Low   PT General Charges $$ ACUTE PT VISIT: 1 Visit         11:56 AM, 04/08/24 Rosaria Settler, PT, DPT [place] with [facility]

## 2024-04-08 NOTE — Consult Note (Signed)
 I connected with  Michelle Foster on 04/08/24 by a video enabled telemedicine application and verified that I am speaking with the correct person using two identifiers.   I discussed the limitations of evaluation and management by telemedicine. The patient expressed understanding and agreed to proceed.  Location of patient: [facility] Location of physician: [facility]  Neurology Consultation Reason for Consult: Right eye vision loss Referring Physician: Dr. Mliss Boyers  CC: Right eye vision loss  History is obtained from: Patient, chart review  HPI: Michelle Foster is a 74 y.o. female with past medical history of hypertension, hyperlipidemia, prediabetes who presented due to transient vision loss in right eye.  States yesterday morning she was standing and then suddenly noticed a curtain coming down her right eye.  She called her husband.  After a few minutes she started being able to see from the middle which gradually improved and then completely resolved.  Denies similar episodes in the past.  Denies any recurrence of the episodes since.  States she took an aspirin  after the episode but does not take take daily antiplatelets or anticoagulation.  Last normal: 03/29/2024 afternoon No tPA or thrombectomy as symptoms resolved and no large vessel occlusion Event happened at home mRS 0   ROS: All other systems reviewed and negative except as noted in the HPI.   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,    Colon polyps    COVID-19 10/19/2022   Estrogen deficiency    Eustachian tube dysfunction    History of kidney stones    Hyperlipidemia    no meds   Hypertension    Labile HTN   Osteoporosis    Pre-diabetes     Family History  Problem Relation Age of Onset   Breast  cancer Mother 59   Heart disease Mother    Alzheimer's disease Mother    Seizures Father    Arrhythmia Father    Heart failure Father    Heart disease Father    Atrial fibrillation Father    Stroke Father 60   Heart failure Brother    Brain cancer Brother    Alzheimer's disease Maternal Aunt    Alzheimer's disease Maternal Aunt    Alzheimer's disease Maternal Aunt    Alzheimer's disease Paternal Aunt    Alzheimer's disease Maternal Grandmother    Stomach cancer Neg Hx    Colon cancer Neg Hx    Pancreatic cancer Neg Hx    Esophageal cancer Neg Hx     Social History:  reports that she has never smoked. She has never used smokeless tobacco. She reports current alcohol use of about 1.0 standard drink of alcohol per week. She reports that she does not use drugs.   Facility-Administered Medications Prior to Admission  Medication Dose Route Frequency Provider Last Rate Last Admin   [START ON 07/12/2024] denosumab  (PROLIA ) injection 60 mg  60 mg Subcutaneous Q6 months [name] DASEN, MD       Medications Prior to Admission  Medication Sig Dispense Refill Last Dose/Taking   cyanocobalamin (VITAMIN B12) 1000 MCG tablet Take 1,000 mcg by mouth daily.   Past Week   lovastatin (MEVACOR) 20 MG tablet Take 20 mg by  mouth at bedtime.   Past Week      Exam: Current vital signs: BP 135/61 (BP Location: Right Arm)   Pulse 69   Temp 97.8 F (36.6 C) (Oral)   Resp 18   Ht 5' 4 (1.626 m)   Wt 68.9 kg   SpO2 98%   BMI 26.07 kg/m  Vital signs in last 24 hours: Temp:  [97.5 F (36.4 C)-97.8 F (36.6 C)] 97.8 F (36.6 C) (01/20 0455) Pulse Rate:  [65-83] 69 (01/20 0455) Resp:  [15-25] 18 (01/20 0455) BP: (125-149)/(61-90) 135/61 (01/20 0455) SpO2:  [96 %-100 %] 98 % (01/20 0455) Weight:  [68.9 kg-69.9 kg] 68.9 kg (01/19 2049)   Physical Exam  Constitutional: Appears well-developed and well-nourished.  Psych: Affect appropriate to situation Neuro: AO x 3, cranial nerves grossly  intact, no aphasia or dysarthria, antigravity in all extremities without drift, sensory intact to light touch, FTN intact bilaterally  NIHSS 0  I have reviewed labs in epic and the results pertinent to this consultation are: CBC:  Recent Labs  Lab 04/07/24 1858  WBC 9.6  NEUTROABS 6.1  HGB 12.6  HCT 39.0  MCV 98.7  PLT 320    Basic Metabolic Panel:  Lab Results  Component Value Date   NA 140 04/08/2024   K 4.5 04/08/2024   CO2 21 (L) 04/08/2024   GLUCOSE 100 (H) 04/08/2024   BUN 12 04/08/2024   CREATININE 1.01 (H) 04/08/2024   CALCIUM  9.1 04/08/2024   GFRNONAA 58 (L) 04/08/2024   GFRAA 66 10/27/2019   Lipid Panel:  Lab Results  Component Value Date   LDLCALC 58 04/08/2024   HgbA1c:  Lab Results  Component Value Date   HGBA1C 6.1 (H) 04/07/2024   Urine Drug Screen: No results found for: LABOPIA, COCAINSCRNUR, LABBENZ, AMPHETMU, THCU, LABBARB  Alcohol Level     Component Value Date/Time   [facility] <15 04/07/2024 1822     I have reviewed the images obtained: MRI brain without contrast 1/90/2026: No acute abnormality  CT head and neck with and without contrast 04/08/2024: No large vessel occlusion or significant stenosis   ASSESSMENT/PLAN: 74 year old female with right eye transient vision loss  Amaurosis fugax (right ocular TIA) - Likely embolic  Recommendations: - TTE ordered and pending if negative, recommend cardiac monitor to look for paroxysmal A-fib - Recommend aspirin  81 mg daily - LDL less than 70, okay to continue lovastatin for now -Goal blood pressure: Normotension - Follow-up with ophthalmology outpatient - Continue to follow-up with Dr[facility]Jaffe at [facility] neurology - Discussed plan with patient and husband at bedside - Discussed with RN at bedside - Discussed with hospitalist via secure chat  Thank you for allowing us  to participate in the care of this patient. If you have any further questions, please contact  me or neurohospitalist.      Arlin Krebs Epile[facility]

## 2024-04-08 NOTE — Discharge Summary (Signed)
 Physician Discharge Summary  Michelle Foster FMW:[id number] DOB: 03-May-1950 DOA: 04/07/2024  PCP: [name] DASEN, MD  Admit date: 04/07/2024  Discharge date: 04/08/2024  Admitted From:Home  Disposition:  Home  Recommendations for Outpatient Follow-up:  Follow up with PCP in 1-2 weeks Follow-up with ophthalmology that patient is already established with Follow-up with Dr.Jaffe at [facility] neurology  Continue on lovastatin and aspirin  81 mg daily 30-day cardiac monitor to be sent home for evaluation of paroxysmal atrial fibrillation Continue other home medications as prior  Home Health: None  Equipment/Devices: None  Discharge Condition:Stable  CODE STATUS: Full  Diet recommendation: Heart Healthy  Brief/Interim Summary: Michelle Foster is a 74 y.o. female with medical history significant for HLD who presents to the ER due to sudden onset right vision loss.  This lasted for approximately 5 minutes and was resolved by the time of admission.  She was noted to have amaurosis fugax likely secondary to right ocular TIA per neurology.  Brain MRI negative and no LVO noted.  2D echocardiogram as noted below with no findings of thrombosis and preserved LVEF.  Recommendations are as noted above with continuation of statin and aspirin  as prescribed and 30-day cardiac monitor.  She will follow-up with ophthalmology in a.m. and also with her neurologist as noted above.  Discharge Diagnoses:  Principal Problem:   TIA (transient ischemic attack)  Principal discharge diagnosis: Right ocular TIA likely embolic.  Discharge Instructions  Discharge Instructions     Increase activity slowly   Complete by: As directed       Allergies as of 04/08/2024       Reactions   Meperidine Hcl Shortness Of Breath   Codeine Nausea And Vomiting   Oxycodone  Nausea Only        Medication List     TAKE these medications    aspirin  EC 81 MG tablet Take 1 tablet (81 mg total) by mouth daily.  Swallow whole. Start taking on: April 09, 2024   cyanocobalamin 1000 MCG tablet Commonly known as: VITAMIN B12 Take 1,000 mcg by mouth daily.   lovastatin 20 MG tablet Commonly known as: MEVACOR Take 20 mg by mouth at bedtime.        Follow-up Information     [name] DASEN, MD. Schedule an appointment as soon as possible for a visit in 1 week(s).   Specialty: Family Medicine Contact information: [address] [place] [address] [phone]                Allergies[1]  Consultations: Neurology   Procedures/Studies: ECHOCARDIOGRAM COMPLETE Result Date: 04/08/2024    ECHOCARDIOGRAM REPORT   Patient Name:   Michelle Foster [facility] Date of Exam: 04/08/2024 Medical Rec #:  [id number]         Height:       64.0 in Accession #:    [id number]        Weight:       151.9 lb Date of Birth:  [birth year]          BSA:          1.740 m Patient Age:    73 years          BP:           125/67 mmHg Patient Gender: F                 HR:           70 bpm. Exam Location:  [facility] Procedure: 2D Echo, Cardiac Doppler and Color Doppler (Both Spectral and Color            Flow Doppler were utilized during procedure). Indications:    TIA G45.9  History:        Patient has prior history of Echocardiogram examinations, most                 recent 04/02/2017. Risk Factors:Hypertension, Dyslipidemia and                 Prediabetes. Hx of COVID-19 and DOE.  Sonographer:    Aida Pizza RCS Referring Phys: [id number] Dotty Gonzalo D [facility] IMPRESSIONS  1. Left ventricular ejection fraction, by estimation, is 65 to 70%. The left ventricle has normal function. The left ventricle has no regional wall motion abnormalities. There is mild left ventricular hypertrophy. Left ventricular diastolic parameters are consistent with Grade I diastolic dysfunction (impaired relaxation).  2. Right ventricular systolic function is normal. The right ventricular size is normal. Tricuspid regurgitation signal is inadequate for  assessing PA pressure.  3. The mitral valve is normal in structure. No evidence of mitral valve regurgitation. No evidence of mitral stenosis.  4. The aortic valve is tricuspid. Aortic valve regurgitation is not visualized. No aortic stenosis is present.  5. The inferior vena cava is normal in size with greater than 50% respiratory variability, suggesting right atrial pressure of 3 mmHg. FINDINGS  Left Ventricle: Left ventricular ejection fraction, by estimation, is 65 to 70%. The left ventricle has normal function. The left ventricle has no regional wall motion abnormalities. The left ventricular internal cavity size was normal in size. There is  mild left ventricular hypertrophy. Left ventricular diastolic parameters are consistent with Grade I diastolic dysfunction (impaired relaxation). Normal left ventricular filling pressure. Right Ventricle: The right ventricular size is normal. Right vetricular wall thickness was not well visualized. Right ventricular systolic function is normal. Tricuspid regurgitation signal is inadequate for assessing PA pressure. Left Atrium: Left atrial size was normal in size. Right Atrium: Right atrial size was normal in size. Pericardium: There is no evidence of pericardial effusion. Mitral Valve: The mitral valve is normal in structure. No evidence of mitral valve regurgitation. No evidence of mitral valve stenosis. Tricuspid Valve: The tricuspid valve is normal in structure. Tricuspid valve regurgitation is not demonstrated. No evidence of tricuspid stenosis. Aortic Valve: The aortic valve is tricuspid. Aortic valve regurgitation is not visualized. No aortic stenosis is present. Aortic valve mean gradient measures 3.5 mmHg. Aortic valve peak gradient measures 6.4 mmHg. Aortic valve area, by VTI measures 2.38 cm. Pulmonic Valve: The pulmonic valve was not well visualized. Pulmonic valve regurgitation is not visualized. No evidence of pulmonic stenosis. Aorta: The aortic root and  ascending aorta are structurally normal, with no evidence of dilitation. Venous: The inferior vena cava is normal in size with greater than 50% respiratory variability, suggesting right atrial pressure of 3 mmHg. IAS/Shunts: No atrial level shunt detected by color flow Doppler.  LEFT VENTRICLE PLAX 2D LVIDd:         4.00 cm   Diastology LVIDs:         2.10 cm   LV e' medial:    8.95 cm/s LV PW:         1.10 cm   LV E/e' medial:  7.6 LV IVS:        1.20 cm   LV e' lateral:   6.47 cm/s LVOT diam:     1.90  cm   LV E/e' lateral: 10.5 LV SV:         64 LV SV Index:   37 LVOT Area:     2.84 cm  RIGHT VENTRICLE RV S prime:     13.30 cm/s TAPSE (M-mode): 1.8 cm LEFT ATRIUM             Index        RIGHT ATRIUM           Index LA diam:        3.10 cm 1.78 cm/m   RA Area:     13.50 cm LA Vol (A2C):   46.0 ml 26.43 ml/m  RA Volume:   31.70 ml  18.21 ml/m LA Vol (A4C):   37.6 ml 21.60 ml/m LA Biplane Vol: 43.1 ml 24.76 ml/m  AORTIC VALVE AV Area (Vmax):    2.31 cm AV Area (Vmean):   1.92 cm AV Area (VTI):     2.38 cm AV Vmax:           126.54 cm/s AV Vmean:          88.983 cm/s AV VTI:            0.269 m AV Peak Grad:      6.4 mmHg AV Mean Grad:      3.5 mmHg LVOT Vmax:         103.00 cm/s LVOT Vmean:        60.400 cm/s LVOT VTI:          0.226 m LVOT/AV VTI ratio: 0.84  AORTA Ao Root diam: 3.50 cm Ao Asc diam:  3.10 cm MITRAL VALVE MV Area (PHT): 3.48 cm    SHUNTS MV Decel Time: 218 msec    Systemic VTI:  0.23 m MV E velocity: 68.00 cm/s  Systemic Diam: 1.90 cm MV A velocity: 84.60 cm/s MV E/A ratio:  0.80 Dorn Ross MD Electronically signed by Dorn Ross MD Signature Date/Time: 04/08/2024/6:01:15 PM    Final    CT ANGIO HEAD NECK W WO CM Result Date: 04/08/2024 EXAM: CTA Head and Neck with Intravenous Contrast. CT Head without Contrast. CLINICAL HISTORY: Transient ischemic attack (TIA) TECHNIQUE: Axial CTA images of the head and neck performed with intravenous contrast. MIP reconstructed images were  created and reviewed. Axial computed tomography images of the head/brain performed without intravenous contrast. Note: Per PQRS, the description of internal carotid artery percent stenosis, including 0 percent or normal exam, is based on North American Symptomatic Carotid Endarterectomy Trial (NASCET) criteria. Dose reduction technique was used including one or more of the following: automated exposure control, adjustment of mA and kV according to patient size, and/or iterative reconstruction. CONTRAST: With; COMPARISON: None provided. FINDINGS: CT HEAD: BRAIN: No acute intraparenchymal hemorrhage. No mass lesion. No CT evidence for acute territorial infarct. No midline shift or extra-axial collection. VENTRICLES: No hydrocephalus. ORBITS: The orbits are unremarkable. SINUSES AND MASTOIDS: The paranasal sinuses and mastoid air cells are clear. CTA NECK: COMMON CAROTID ARTERIES: No significant stenosis. No dissection or occlusion. INTERNAL CAROTID ARTERIES: No stenosis by NASCET criteria. No dissection or occlusion. VERTEBRAL ARTERIES: No significant stenosis. No dissection or occlusion. CTA HEAD: ANTERIOR CEREBRAL ARTERIES: No significant stenosis. No occlusion. No aneurysm. MIDDLE CEREBRAL ARTERIES: No significant stenosis. No occlusion. No aneurysm. POSTERIOR CEREBRAL ARTERIES: No significant stenosis. No occlusion. No aneurysm. BASILAR ARTERY: No significant stenosis. No occlusion. No aneurysm. OTHER: SOFT TISSUES: No acute finding. No masses or lymphadenopathy. BONES: No acute osseous abnormality.  IMPRESSION: 1. No acute intracranial abnormality. 2. No large vessel occlusion or significant stenosis. Electronically signed by: Glendia Molt MD 04/08/2024 01:47 AM EST RP Workstation: [id number]   MR BRAIN WO CONTRAST Result Date: 04/07/2024 EXAM: MRI BRAIN WITHOUT CONTRAST 04/07/2024 06:45:16 PM TECHNIQUE: Multiplanar multisequence MRI of the head/brain was performed without the administration of intravenous  contrast. COMPARISON: None available. CLINICAL HISTORY: Neuro deficit, acute, stroke suspected. FINDINGS: BRAIN AND VENTRICLES: No acute infarct. No intracranial hemorrhage. No mass. No midline shift. No hydrocephalus. There is a prominent perivascular space in the inferior aspect of the left basal ganglia. There is minimal supratentorial white matter signal abnormality likely reflecting chronic microvascular ischemic changes. The cerebral volume is within normal limits for the patient's age. The sella is unremarkable. Normal flow voids. ORBITS: Bilateral lens replacement. SINUSES AND MASTOIDS: No significant abnormality. BONES AND SOFT TISSUES: Normal marrow signal. No soft tissue abnormality. IMPRESSION: 1. No acute findings. Electronically signed by: Donnice Mania MD 04/07/2024 07:11 PM EST RP Workstation: [id number]     Discharge Exam: Vitals:   04/08/24 0455 04/08/24 1254  BP: 135/61 125/67  Pulse: 69 70  Resp: 18 16  Temp: 97.8 F (36.6 C) 98.1 F (36.7 C)  SpO2: 98% 95%   Vitals:   04/07/24 2049 04/08/24 0105 04/08/24 0455 04/08/24 1254  BP: (!) 146/77 126/73 135/61 125/67  Pulse: 65 70 69 70  Resp: 18 18 18 16   Temp: 97.7 F (36.5 C) (!) 97.5 F (36.4 C) 97.8 F (36.6 C) 98.1 F (36.7 C)  TempSrc: Oral Oral Oral Oral  SpO2: 100% 98% 98% 95%  Weight: 68.9 kg     Height:        General: Pt is alert, awake, not in acute distress Cardiovascular: RRR, S1/S2 +, no rubs, no gallops Respiratory: CTA bilaterally, no wheezing, no rhonchi Abdominal: Soft, NT, ND, bowel sounds + Extremities: no edema, no cyanosis    The results of significant diagnostics from this hospitalization (including imaging, microbiology, ancillary and laboratory) are listed below for reference.     Microbiology: No results found for this or any previous visit (from the past 240 hours).   Labs: BNP (last 3 results) No results for input(s): BNP in the last 8760 hours. Basic Metabolic Panel: Recent  Labs  Lab 04/07/24 1822 04/08/24 0436  NA 139 140  K 4.3 4.5  CL 105 107  CO2 20* 21*  GLUCOSE 133* 100*  BUN 15 12  CREATININE 1.18* 1.01*  CALCIUM  9.8 9.1   Liver Function Tests: Recent Labs  Lab 04/07/24 1822  AST 24  ALT 9  ALKPHOS 70  BILITOT 0.3  PROT 7.8  ALBUMIN 4.2   No results for input(s): LIPASE, AMYLASE in the last 168 hours. No results for input(s): AMMONIA in the last 168 hours. CBC: Recent Labs  Lab 04/07/24 1858  WBC 9.6  NEUTROABS 6.1  HGB 12.6  HCT 39.0  MCV 98.7  PLT 320   Cardiac Enzymes: No results for input(s): CKTOTAL, CKMB, CKMBINDEX, TROPONINI in the last 168 hours. BNP: Invalid input(s): POCBNP CBG: Recent Labs  Lab 04/07/24 1817  GLUCAP 155*   D-Dimer No results for input(s): DDIMER in the last 72 hours. Hgb A1c Recent Labs    04/07/24 1858  HGBA1C 6.1*   Lipid Profile Recent Labs    04/08/24 0436  CHOL 116  HDL 27*  LDLCALC 58  TRIG 843*  CHOLHDL 4.3   Thyroid  function studies No results for input(s): TSH, T4TOTAL, T3FREE,  THYROIDAB in the last 72 hours.  Invalid input(s): FREET3 Anemia work up No results for input(s): VITAMINB12, FOLATE, FERRITIN, TIBC, IRON, RETICCTPCT in the last 72 hours. Urinalysis    Component Value Date/Time   COLORURINE YELLOW 12/23/2020 1506   APPEARANCEUR CLEAR 12/23/2020 1506   LABSPEC 1.020 12/23/2020 1506   PHURINE 6.0 12/23/2020 1506   GLUCOSEU NEGATIVE 12/23/2020 1506   HGBUR 1+ (A) 12/23/2020 1506   BILIRUBINUR NEGATIVE 02/19/2017 1040   KETONESUR NEGATIVE 12/23/2020 1506   PROTEINUR NEGATIVE 12/23/2020 1506   UROBILINOGEN 0.2 02/19/2017 1040   NITRITE NEGATIVE 12/23/2020 1506   LEUKOCYTESUR NEGATIVE 12/23/2020 1506   Sepsis Labs Recent Labs  Lab 04/07/24 1858  WBC 9.6   Microbiology No results found for this or any previous visit (from the past 240 hours).   Time coordinating discharge: 35 minutes  SIGNED:   Adron JONETTA Fairly, DO [facility] 04/08/2024, 6:06 PM  If 7PM-7AM, please contact night-coverage [email or web address]     [1]  Allergies Allergen Reactions   Meperidine Hcl Shortness Of Breath   Codeine Nausea And Vomiting   Oxycodone  Nausea Only

## 2024-04-08 NOTE — Progress Notes (Signed)
 Per RN, patient is to have echo prior to discharge. Re-scheduling inpatient echo for today.

## 2024-04-08 NOTE — Care Management Obs Status (Signed)
 MEDICARE OBSERVATION STATUS NOTIFICATION   Patient Details  [name] MRN: [id number] Date of Birth: 31-Dec-1950   Medicare Observation Status Notification Given:  Yes    Doni Widmer L Anmarie Fukushima 04/08/2024, 4:40 PM

## 2024-04-09 ENCOUNTER — Telehealth: Payer: Self-pay

## 2024-04-09 NOTE — Transitions of Care (Post Inpatient/ED Visit) (Signed)
" ° °  04/09/2024  [name] MRN: [id number] DOB: 08/21/1950  Today's TOC FU Call Status: Today's TOC FU Call Status:: Successful TOC FU Call Completed TOC FU Call Complete Date: 04/09/24  Patient's Name and Date of Birth confirmed. Name, DOB  Transition Care Management Follow-up Telephone Call Date of Discharge: 04/08/24 Discharge [facility] Type of Discharge: Inpatient Admission Primary Inpatient Discharge Diagnosis:: TIA How have you been since you were released from the hospital?: Better Any questions or concerns?: No  Items Reviewed: Did you receive and understand the discharge instructions provided?: Yes Medications obtained,verified, and reconciled?: Yes (Medications Reviewed) Any new allergies since your discharge?: No Dietary orders reviewed?: Yes Do you have support at home?: Yes People in Home [RPT]: spouse  Medications Reviewed Today: Medications Reviewed Today     Reviewed by Emmitt Pan, LPN (Licensed Practical Nurse) on 04/09/24 at 1418  Med List Status: <None>   Medication Order Taking? Sig Documenting Provider Last Dose Status Informant  aspirin  EC 81 MG tablet [id number] Yes Take 1 tablet (81 mg total) by mouth daily. Swallow whole. Maree, Pratik D, DO  Active   cyanocobalamin (VITAMIN B12) 1000 MCG tablet [id number] Yes Take 1,000 mcg by mouth daily. [provider]  Active Self, Pharmacy Records  denosumab  (PROLIA ) injection 60 mg [id number]   [name] DASEN, MD  Active   lovastatin (MEVACOR) 20 MG tablet [id number] Yes Take 20 mg by mouth at bedtime. [provider]  Active Self, Pharmacy Records           Med Note DARILYN, DAWN S   Tue Apr 08, 2024  8:54 AM) No recent pharmacy records.            Home Care and Equipment/Supplies: Were Home Health Services Ordered?: NA Any new equipment or medical supplies ordered?: NA  Functional Questionnaire: Do you need assistance with bathing/showering or  dressing?: No Do you need assistance with meal preparation?: No Do you need assistance with eating?: No Do you have difficulty maintaining continence: No Do you need assistance with getting out of bed/getting out of a chair/moving?: No Do you have difficulty managing or taking your medications?: No  Follow up appointments reviewed: PCP Follow-up appointment confirmed?: No (declined, will call back to schedule) MD Provider Line Number:[phone] Given: No Specialist Hospital Follow-up appointment confirmed?: NA Do you need transportation to your follow-up appointment?: No Do you understand care options if your condition(s) worsen?: Yes-patient verbalized understanding    SIGNATURE Pan Emmitt, LPN [facility] Nurse Health Advisor Direct Dial [phone]  "

## 2024-04-25 ENCOUNTER — Ambulatory Visit

## 2024-09-29 ENCOUNTER — Ambulatory Visit: Payer: Self-pay

## 2024-09-29 ENCOUNTER — Ambulatory Visit: Payer: Self-pay | Admitting: Neurology

## 2024-12-25 ENCOUNTER — Ambulatory Visit
